# Patient Record
Sex: Female | Born: 1965
Health system: Southern US, Community
[De-identification: ages and names within clinical notes are randomized; demographics above are authoritative.]

## PROBLEM LIST (undated history)

## (undated) DIAGNOSIS — M5030 Other cervical disc degeneration, unspecified cervical region: Secondary | ICD-10-CM

## (undated) DIAGNOSIS — I10 Essential (primary) hypertension: Secondary | ICD-10-CM

## (undated) DIAGNOSIS — Z9889 Other specified postprocedural states: Secondary | ICD-10-CM

## (undated) DIAGNOSIS — T4145XA Adverse effect of unspecified anesthetic, initial encounter: Secondary | ICD-10-CM

## (undated) DIAGNOSIS — D485 Neoplasm of uncertain behavior of skin: Secondary | ICD-10-CM

## (undated) DIAGNOSIS — Z936 Other artificial openings of urinary tract status: Secondary | ICD-10-CM

## (undated) DIAGNOSIS — R112 Nausea with vomiting, unspecified: Secondary | ICD-10-CM

## (undated) DIAGNOSIS — C801 Malignant (primary) neoplasm, unspecified: Secondary | ICD-10-CM

## (undated) DIAGNOSIS — K2950 Unspecified chronic gastritis without bleeding: Secondary | ICD-10-CM

## (undated) DIAGNOSIS — F419 Anxiety disorder, unspecified: Secondary | ICD-10-CM

## (undated) DIAGNOSIS — Z87442 Personal history of urinary calculi: Secondary | ICD-10-CM

## (undated) DIAGNOSIS — T8859XA Other complications of anesthesia, initial encounter: Secondary | ICD-10-CM

## (undated) DIAGNOSIS — K219 Gastro-esophageal reflux disease without esophagitis: Secondary | ICD-10-CM

## (undated) DIAGNOSIS — M1990 Unspecified osteoarthritis, unspecified site: Secondary | ICD-10-CM

## (undated) DIAGNOSIS — N281 Cyst of kidney, acquired: Secondary | ICD-10-CM

## (undated) DIAGNOSIS — K317 Polyp of stomach and duodenum: Secondary | ICD-10-CM

## (undated) DIAGNOSIS — D649 Anemia, unspecified: Secondary | ICD-10-CM

## (undated) DIAGNOSIS — K52831 Collagenous colitis: Secondary | ICD-10-CM

## (undated) DIAGNOSIS — K529 Noninfective gastroenteritis and colitis, unspecified: Secondary | ICD-10-CM

## (undated) HISTORY — DX: Essential (primary) hypertension: I10

## (undated) HISTORY — DX: Collagenous colitis: K52.831

## (undated) HISTORY — PX: HIP ARTHROPLASTY: SHX981

## (undated) HISTORY — DX: Cyst of kidney, acquired: N28.1

## (undated) HISTORY — DX: Polyp of stomach and duodenum: K31.7

## (undated) HISTORY — PX: APPENDECTOMY: SHX54

## (undated) HISTORY — PX: BACK SURGERY: SHX140

## (undated) HISTORY — DX: Unspecified chronic gastritis without bleeding: K29.50

## (undated) HISTORY — DX: Neoplasm of uncertain behavior of skin: D48.5

## (undated) HISTORY — DX: Noninfective gastroenteritis and colitis, unspecified: K52.9

---
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
## 2003-03-23 HISTORY — PX: ABDOMINAL HYSTERECTOMY: SHX81

## 2012-11-07 ENCOUNTER — Emergency Department: Payer: Self-pay | Admitting: Internal Medicine

## 2012-11-07 LAB — URINALYSIS, COMPLETE
Bacteria: NONE SEEN
Bilirubin,UR: NEGATIVE
Blood: NEGATIVE
Glucose,UR: NEGATIVE mg/dL (ref 0–75)
Ketone: NEGATIVE
Nitrite: NEGATIVE
Ph: 7 (ref 4.5–8.0)
Protein: NEGATIVE
RBC,UR: 1 /HPF (ref 0–5)
Specific Gravity: 1.016 (ref 1.003–1.030)
Squamous Epithelial: 1
WBC UR: 2 /HPF (ref 0–5)

## 2013-02-06 DIAGNOSIS — M47812 Spondylosis without myelopathy or radiculopathy, cervical region: Secondary | ICD-10-CM | POA: Insufficient documentation

## 2013-02-06 DIAGNOSIS — M5000 Cervical disc disorder with myelopathy, unspecified cervical region: Secondary | ICD-10-CM | POA: Insufficient documentation

## 2013-03-22 HISTORY — PX: NECK SURGERY: SHX720

## 2013-05-22 DIAGNOSIS — M5116 Intervertebral disc disorders with radiculopathy, lumbar region: Secondary | ICD-10-CM | POA: Insufficient documentation

## 2013-06-19 ENCOUNTER — Encounter: Payer: Self-pay | Admitting: Neurosurgery

## 2013-06-20 ENCOUNTER — Encounter: Payer: Self-pay | Admitting: Neurosurgery

## 2013-07-20 ENCOUNTER — Encounter: Payer: Self-pay | Admitting: Neurosurgery

## 2013-08-20 ENCOUNTER — Encounter: Payer: Self-pay | Admitting: Neurosurgery

## 2013-09-19 ENCOUNTER — Encounter: Payer: Self-pay | Admitting: Neurosurgery

## 2013-10-10 ENCOUNTER — Ambulatory Visit: Payer: Self-pay | Admitting: Neurosurgery

## 2013-10-25 DIAGNOSIS — M1610 Unilateral primary osteoarthritis, unspecified hip: Secondary | ICD-10-CM | POA: Insufficient documentation

## 2013-10-25 HISTORY — DX: Unilateral primary osteoarthritis, unspecified hip: M16.10

## 2013-11-01 ENCOUNTER — Ambulatory Visit: Payer: Self-pay | Admitting: Gastroenterology

## 2013-11-01 HISTORY — PX: COLONOSCOPY W/ BIOPSIES: SHX1374

## 2013-11-01 LAB — HM COLONOSCOPY

## 2013-11-20 DIAGNOSIS — K2950 Unspecified chronic gastritis without bleeding: Secondary | ICD-10-CM

## 2013-11-20 HISTORY — DX: Unspecified chronic gastritis without bleeding: K29.50

## 2013-11-22 ENCOUNTER — Ambulatory Visit: Payer: Self-pay | Admitting: Gastroenterology

## 2013-11-22 HISTORY — PX: ESOPHAGOGASTRODUODENOSCOPY: SHX1529

## 2014-01-02 DIAGNOSIS — K900 Celiac disease: Secondary | ICD-10-CM | POA: Insufficient documentation

## 2014-01-02 DIAGNOSIS — K9049 Malabsorption due to intolerance, not elsewhere classified: Secondary | ICD-10-CM | POA: Insufficient documentation

## 2014-01-09 ENCOUNTER — Emergency Department: Payer: Self-pay | Admitting: Emergency Medicine

## 2014-01-09 LAB — BASIC METABOLIC PANEL
Anion Gap: 2 — ABNORMAL LOW (ref 7–16)
BUN: 14 mg/dL (ref 7–18)
Calcium, Total: 8.3 mg/dL — ABNORMAL LOW (ref 8.5–10.1)
Chloride: 109 mmol/L — ABNORMAL HIGH (ref 98–107)
Co2: 29 mmol/L (ref 21–32)
Creatinine: 0.78 mg/dL (ref 0.60–1.30)
EGFR (African American): >60
EGFR (Non-African Amer.): >60
Glucose: 104 mg/dL — ABNORMAL HIGH (ref 65–99)
Osmolality: 280 (ref 275–301)
Potassium: 3.5 mmol/L (ref 3.5–5.1)
Sodium: 140 mmol/L (ref 136–145)

## 2014-01-09 LAB — CBC
HCT: 40.1 % (ref 35.0–47.0)
HGB: 12.9 g/dL (ref 12.0–16.0)
MCH: 29.2 pg (ref 26.0–34.0)
MCHC: 32.1 g/dL (ref 32.0–36.0)
MCV: 91 fL (ref 80–100)
Platelet: 264 10*3/uL (ref 150–440)
RBC: 4.4 10*6/uL (ref 3.80–5.20)
RDW: 14.5 % (ref 11.5–14.5)
WBC: 12.6 10*3/uL — ABNORMAL HIGH (ref 3.6–11.0)

## 2014-01-09 LAB — TROPONIN I: Troponin-I: <0.02 ng/mL

## 2014-01-09 LAB — D-DIMER(ARMC): D-Dimer: 637 ng/ml

## 2014-06-28 DIAGNOSIS — Z96643 Presence of artificial hip joint, bilateral: Secondary | ICD-10-CM | POA: Insufficient documentation

## 2014-12-19 ENCOUNTER — Telehealth: Payer: Self-pay | Admitting: Family Medicine

## 2014-12-19 DIAGNOSIS — N281 Cyst of kidney, acquired: Secondary | ICD-10-CM | POA: Insufficient documentation

## 2014-12-19 DIAGNOSIS — K529 Noninfective gastroenteritis and colitis, unspecified: Secondary | ICD-10-CM | POA: Insufficient documentation

## 2014-12-19 DIAGNOSIS — K52831 Collagenous colitis: Secondary | ICD-10-CM | POA: Insufficient documentation

## 2014-12-19 DIAGNOSIS — I10 Essential (primary) hypertension: Secondary | ICD-10-CM | POA: Insufficient documentation

## 2014-12-19 NOTE — Telephone Encounter (Signed)
She had her BP check at pharmacy and it was 193/103. She went to ER. Had high bp, dizziness, headache. She doesn't remember what her BP was in the ER. She states she was out of it. They gave her Zofran and 6 Xanax and told her to call us. She said she was on Lisinopril in the past.

## 2014-12-19 NOTE — Telephone Encounter (Signed)
She needs an appointment for ER follow-up; make room please and make sure records are in [place]

## 2014-12-19 NOTE — Telephone Encounter (Signed)
I spoke with patient and advised her to please call the ER back and ask them to please treat her BP with actual BP meds until she can get in to see [place] on Monday. She agreed and will call us back once she talks with them.

## 2014-12-19 NOTE — Telephone Encounter (Signed)
Pt called and stated that she was taken to [facility] yesterday for a extremely high. They suggested she get on some type of bp medication but the first available appt is Monday the 3rd and she should do to keep it down until then

## 2014-12-19 NOTE — Telephone Encounter (Signed)
Per Dr. Sanda Klein since she has multiple work ins already tomorrow, have patient call back to the ER, demand that they treat her BP with actual BP medicine and not Xanax until she can get in here to be seen on Monday. Once patient has spoke with them, have her call us back to let us know what they said.

## 2014-12-23 ENCOUNTER — Ambulatory Visit (INDEPENDENT_AMBULATORY_CARE_PROVIDER_SITE_OTHER): Payer: No Typology Code available for payment source | Admitting: Family Medicine

## 2014-12-23 ENCOUNTER — Encounter: Payer: Self-pay | Admitting: Family Medicine

## 2014-12-23 VITALS — BP 148/84 | HR 98 | Temp 99.2°F | Ht 65.7 in | Wt 221.0 lb

## 2014-12-23 DIAGNOSIS — F430 Acute stress reaction: Secondary | ICD-10-CM | POA: Diagnosis not present

## 2014-12-23 DIAGNOSIS — R51 Headache: Secondary | ICD-10-CM

## 2014-12-23 DIAGNOSIS — R519 Headache, unspecified: Secondary | ICD-10-CM

## 2014-12-23 DIAGNOSIS — R930 Abnormal findings on diagnostic imaging of skull and head, not elsewhere classified: Secondary | ICD-10-CM

## 2014-12-23 DIAGNOSIS — H9201 Otalgia, right ear: Secondary | ICD-10-CM

## 2014-12-23 DIAGNOSIS — Z720 Tobacco use: Secondary | ICD-10-CM | POA: Insufficient documentation

## 2014-12-23 DIAGNOSIS — I10 Essential (primary) hypertension: Secondary | ICD-10-CM

## 2014-12-23 DIAGNOSIS — R9089 Other abnormal findings on diagnostic imaging of central nervous system: Secondary | ICD-10-CM

## 2014-12-23 HISTORY — DX: Tobacco use: Z72.0

## 2014-12-23 MED ORDER — METOPROLOL SUCCINATE ER 25 MG PO TB24: 25.0000 mg | ORAL_TABLET | Freq: Every day | ORAL | Status: DC

## 2014-12-23 MED ORDER — BUPROPION HCL ER (XL) 150 MG PO TB24: 150.0000 mg | ORAL_TABLET | Freq: Every day | ORAL | Status: DC

## 2014-12-23 NOTE — Progress Notes (Signed)
BP 148/84 mmHg  Pulse 98  Temp(Src) 99.2 F (37.3 C)  Ht 5' 5.7" (1.669 m)  Wt 221 lb (100.245 kg)  BMI 35.99 kg/m2  SpO2 100%   Subjective:    [name], female    DOB: [birth year], 49 y.o.   MRN: [id number]  HPI: Susan Snyder is a 49 y.o. female  Chief Complaint  Patient presents with  . Hypertension    patient went to ER for HTN, she states that she has been really fatigued also.  . 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; financial stress from surgeries and being out of work; feels overwhelmed sometimes; I just feel like I could crawl out of my skin; if she sits still, she gets depressed; struggling with death of husband from four years ago; did work with counselor before  I reviewed the ER notes from [facility] from her Sept 28, 2016 encounter; BP was 185/81, pulse 87, O2 sat 97%; description of problems in encounter included "elevated BP, shortness of breath, vision disturbance, abnormal head CT, acute nonintractable headache, unspecified headache, elevated blood pressure, anxiety state"; prescriptions written were ondansetron (ten pills) and alprazolam (six pills) Her trop I was <0.01, CK 66, CMP unremarkable, WBC 11.3k with unremarkable diff  Head CT 12/18/14 report (copied and pasted): IMPRESSION:  1. Moderately pronounced white matter disease process involving each  cerebral hemisphere. Please see above discussion.  2. Otherwise, without evidence of intracranial hemorrhage or mass-effect.  MRI of brain 12/18/14 report (copied and pasted): Impression:  1. Multiple foci abnormal white matter signal as described.  None of these  are in the corpus callosum. There Yang be small lesions in the pons. See  above discussion and differential.  2. Negative for acute infarction or hemorrhage. Negative for mass or  abnormal enhancement.  -----------------------------------  For the abnormal CT of the head, and they referred her to a neurologist; she is going to see someone in Dr. Rico Sheehan office  Depression screen [facility] 2/9 12/23/2014  Decreased Interest 0  Down, Depressed, Hopeless 1  PHQ - 2 Score 1   She does not eat a lot of salt, but does eat a lot of salty foods; no decongestants; no black licorice; smoker; smokes 1/2 ppd; she's been trying to quit smoking; she called back to the ER but they told her if it went high to go back to the ER  She has felt like she has water in her right ear, every morning for 1-2 months; sometimes it is sore; tried swimmer's ear and ear wax removal kit  Relevant past medical, surgical, family and social history reviewed and updated as indicated. Interim medical history since our last visit reviewed. Allergies and medications reviewed and updated.  Review of Systems  Constitutional: Positive for unexpected weight change (gain).  Cardiovascular: Positive for palpitations.  Neurological: Positive for headaches (terrible).  Psychiatric/Behavioral: Positive for dysphoric mood. The patient is nervous/anxious (last few weeks).    Per HPI unless specifically indicated above     Objective:  BP 148/84 mmHg  Pulse 98  Temp(Src) 99.2 F (37.3 C)  Ht 5' 5.7" (1.669 m)  Wt 221 lb (100.245 kg)  BMI 35.99 kg/m2  SpO2 100%  Wt Readings from Last 3 Encounters:  12/23/14 221 lb (100.245 kg)  01/07/14 216 lb (97.977 kg)    Physical Exam  Constitutional: She appears well-developed and well-nourished.  HENT:  Right Ear: Hearing, tympanic membrane, external ear and ear canal normal.  Left Ear: Hearing, tympanic membrane, external ear and ear canal normal.  Nose: No  rhinorrhea.  Mouth/Throat: Oropharynx is clear and moist and mucous membranes are normal.  Eyes: Right eye exhibits no discharge. Left eye exhibits no discharge. Right conjunctiva is not injected. Left conjunctiva is not injected.  Cardiovascular: Normal rate and regular rhythm.   Pulmonary/Chest: Effort normal and breath sounds normal.  Musculoskeletal: She exhibits no edema.  Lymphadenopathy:    She has no cervical adenopathy.  Skin: No pallor.  No flushing  Psychiatric: She has a normal mood and affect. Her behavior is normal.    Results for orders placed or performed in visit on 12/19/14  HM COLONOSCOPY  Result Value Ref Range   HM Colonoscopy collagenous colitis       Assessment & Plan:   Problem List Items Addressed This Visit      Cardiovascular and Mediastinum   Hypertension - Primary    DASH guidelines; start beta-blocker since heart rate running 95-105; stress reduction, close f/u      Relevant Medications   metoprolol succinate (TOPROL-XL) 25 MG 24 hr tablet     Other   Tobacco abuse    Encouraged cessation; tips for success discussed      Stress reaction    Supportive listening provided; suggested counseling; I am not inclined to prescribe a benzo for her; start bupropion which Goldenstein also help with smoking cessation; stress reduction discussed      Frequent headaches    Abnormal CT scan of head in the ER, abnormal brain MRI same day; reports reviewed after patient's appointment; she has been referred to specialist for evaluation      Relevant Medications   traMADol (ULTRAM) 50 MG tablet   buPROPion (WELLBUTRIN XL) 150 MG 24 hr tablet   metoprolol succinate (TOPROL-XL) 25 MG 24 hr tablet   Abnormal finding on MRI of brain    Report reviewed by me; she has been referred to specialist for evaluation       Other Visit Diagnoses    Otalgia, right        referral to ENT for persistent pain in the right ear    Relevant Orders    Ambulatory referral to ENT        Follow up plan: Return in about 3 weeks (around 01/13/2015) for reassessment.  An after-visit summary was printed and given to the patient at [facility].  Please see the patient instructions which Epstein contain other information and recommendations beyond what is mentioned above in the assessment and plan.  Meds ordered this encounter  Medications  . DISCONTD: ondansetron (ZOFRAN-ODT) 4 MG disintegrating tablet    Sig: Take at onset of headache with a dose of tylenol and a caffeine containing beverage. No more than one dose every 8 hours.  . traMADol (ULTRAM) 50 MG tablet    Sig: Take by mouth.  . DISCONTD: ALPRAZolam (XANAX) 0.5 MG tablet    Sig: TAKE 1 TABLET BY MOUTH 2 TMIES DAILY AS NEEDED FOR ANXIETY FOR UP TO 3  DAYS    Refill:  0  . buPROPion (WELLBUTRIN XL) 150 MG 24 hr tablet    Sig: Take 1 tablet (150 mg total) by mouth daily.    Dispense:  30 tablet    Refill:  0  . metoprolol succinate (TOPROL-XL) 25 MG 24 hr tablet    Sig: Take 1 tablet (25 mg total) by mouth daily.    Dispense:  30 tablet    Refill:  0   Orders Placed This Encounter  Procedures  . Ambulatory referral to ENT

## 2014-12-23 NOTE — Assessment & Plan Note (Addendum)
DASH guidelines; start beta-blocker since heart rate running 95-105; stress reduction, close f/u

## 2014-12-23 NOTE — Patient Instructions (Addendum)
Your goal blood pressure is less than 140 mmHg on top. Try to follow the DASH guidelines (DASH stands for Dietary Approaches to Stop Hypertension) Try to limit the sodium in your diet.  Ideally, consume less than 1.5 grams (less than 1,580m) per day. Do not add salt when cooking or at the table.  Check the sodium amount on labels when shopping, and choose items lower in sodium when given a choice. Avoid or limit foods that already contain a lot of sodium. Eat a diet rich in fruits and vegetables and whole grains. Start the new blood pressure medicine called metoprolol, once a day (either morning or night, same time of day) Start the new stress medicine called bupropion, once a day (in the morning) Do try to quit smoking Return in 3 weeks for recheck We'll refer you to the ENT about your ear pain  DASH Eating Plan DASH stands for "Dietary Approaches to Stop Hypertension." The DASH eating plan is a healthy eating plan that has been shown to reduce high blood pressure (hypertension). Additional health benefits Feldmeier include reducing the risk of type 2 diabetes mellitus, heart disease, and stroke. The DASH eating plan Henken also help with weight loss. WHAT DO I NEED TO KNOW ABOUT THE DASH EATING PLAN? For the DASH eating plan, you will follow these general guidelines:  Choose foods with a percent daily value for sodium of less than 5% (as listed on the food label).  Use salt-free seasonings or herbs instead of table salt or sea salt.  Check with your health care provider or pharmacist before using salt substitutes.  Eat lower-sodium products, often labeled as "lower sodium" or "no salt added."  Eat fresh foods.  Eat more vegetables, fruits, and low-fat dairy products.  Choose whole grains. Look for the word "whole" as the first word in the ingredient list.  Choose fish and skinless chicken or t[place]more often than red meat. Limit fish, poultry, and meat to 6 oz (170 g) each day.  Limit  sweets, desserts, sugars, and sugary drinks.  Choose heart-healthy fats.  Limit cheese to 1 oz (28 g) per day.  Eat more home-cooked food and less restaurant, buffet, and fast food.  Limit fried foods.  Cook foods using methods other than frying.  Limit canned vegetables. If you do use them, rinse them well to decrease the sodium.  When eating at a restaurant, ask that your food be prepared with less salt, or no salt if possible. WHAT FOODS CAN I EAT? Seek help from a dietitian for individual calorie needs. Grains Whole grain or whole wheat bread. Brown rice. Whole grain or whole wheat pasta. Quinoa, bulgur, and whole grain cereals. Low-sodium cereals. Corn or whole wheat flour tortillas. Whole grain cornbread. Whole grain crackers. Low-sodium crackers. Vegetables Fresh or frozen vegetables (raw, steamed, roasted, or grilled). Low-sodium or reduced-sodium tomato and vegetable juices. Low-sodium or reduced-sodium tomato sauce and paste. Low-sodium or reduced-sodium canned vegetables.  Fruits All fresh, canned (in natural juice), or frozen fruits. Meat and Other Protein Products Ground beef (85% or leaner), grass-fed beef, or beef trimmed of fat. Skinless chicken or t[place] Ground chicken or t[place] Pork trimmed of fat. All fish and seafood. Eggs. Dried beans, peas, or lentils. Unsalted nuts and seeds. Unsalted canned beans. Dairy Low-fat dairy products, such as skim or 1% milk, 2% or reduced-fat cheeses, low-fat ricotta or cottage cheese, or plain low-fat yogurt. Low-sodium or reduced-sodium cheeses. Fats and Oils Tub margarines without trans fats. Light or  reduced-fat mayonnaise and salad dressings (reduced sodium). Avocado. Safflower, olive, or canola oils. Natural peanut or almond butter. Other Unsalted popcorn and pretzels. The items listed above Selk not be a complete list of recommended foods or beverages. Contact your dietitian for more options. WHAT FOODS ARE NOT  RECOMMENDED? Grains White bread. White pasta. White rice. Refined cornbread. Bagels and croissants. Crackers that contain trans fat. Vegetables Creamed or fried vegetables. Vegetables in a cheese sauce. Regular canned vegetables. Regular canned tomato sauce and paste. Regular tomato and vegetable juices. Fruits Dried fruits. Canned fruit in light or heavy syrup. Fruit juice. Meat and Other Protein Products Fatty cuts of meat. Ribs, chicken wings, bacon, sausage, bologna, salami, chitterlings, fatback, hot dogs, bratwurst, and packaged luncheon meats. Salted nuts and seeds. Canned beans with salt. Dairy Whole or 2% milk, cream, half-and-half, and cream cheese. Whole-fat or sweetened yogurt. Full-fat cheeses or blue cheese. Nondairy creamers and whipped toppings. Processed cheese, cheese spreads, or cheese curds. Condiments Onion and garlic salt, seasoned salt, table salt, and sea salt. Canned and packaged gravies. Worcestershire sauce. Tartar sauce. Barbecue sauce. Teriyaki sauce. Soy sauce, including reduced sodium. Steak sauce. Fish sauce. Oyster sauce. Cocktail sauce. Horseradish. Ketchup and mustard. Meat flavorings and tenderizers. Bouillon cubes. Hot sauce. Tabasco sauce. Marinades. Taco seasonings. Relishes. Fats and Oils Butter, stick margarine, lard, shortening, ghee, and bacon fat. Coconut, palm kernel, or palm oils. Regular salad dressings. Other Pickles and olives. Salted popcorn and pretzels. The items listed above Lemming not be a complete list of foods and beverages to avoid. Contact your dietitian for more information. WHERE CAN I FIND MORE INFORMATION? National Heart, Lung, and Blood Institute: travelstabloid.com Document Released: 02/25/2011 Document Revised: 07/23/2013 Document Reviewed: 01/10/2013 [facility] Patient Information 2015 [place], [place]. This information is not intended to replace advice given to you by your health care provider. Make  sure you discuss any questions you have with your health care provider. Smoking Cessation, Tips for Success If you are ready to quit smoking, congratulations! You have chosen to help yourself be healthier. Cigarettes bring nicotine, tar, carbon monoxide, and other irritants into your body. Your lungs, heart, and blood vessels will be able to work better without these poisons. There are many different ways to quit smoking. Nicotine gum, nicotine patches, a nicotine inhaler, or nicotine nasal spray can help with physical craving. Hypnosis, support groups, and medicines help break the habit of smoking. WHAT THINGS CAN I DO TO MAKE QUITTING EASIER?  Here are some tips to help you quit for good:  Pick a date when you will quit smoking completely. Tell all of your friends and family about your plan to quit on that date.  Do not try to slowly cut down on the number of cigarettes you are smoking. Pick a quit date and quit smoking completely starting on that day.  Throw away all cigarettes.   Clean and remove all ashtrays from your home, work, and car.  On a card, write down your reasons for quitting. Carry the card with you and read it when you get the urge to smoke.  Cleanse your body of nicotine. Drink enough water and fluids to keep your urine clear or pale yellow. Do this after quitting to flush the nicotine from your body.  Learn to predict your moods. Do not let a bad situation be your excuse to have a cigarette. Some situations in your life might tempt you into wanting a cigarette.  Never have "just one" cigarette. It leads to  wanting another and another. Remind yourself of your decision to quit.  Change habits associated with smoking. If you smoked while driving or when feeling stressed, try other activities to replace smoking. Stand up when drinking your coffee. Brush your teeth after eating. Sit in a different chair when you read the paper. Avoid alcohol while trying to quit, and try to  drink fewer caffeinated beverages. Alcohol and caffeine Shifflett urge you to smoke.  Avoid foods and drinks that can trigger a desire to smoke, such as sugary or spicy foods and alcohol.  Ask people who smoke not to smoke around you.  Have something planned to do right after eating or having a cup of coffee. For example, plan to take a walk or exercise.  Try a relaxation exercise to calm you down and decrease your stress. Remember, you Kuster be tense and nervous for the first 2 weeks after you quit, but this will pass.  Find new activities to keep your hands busy. Play with a pen, coin, or rubber band. Doodle or draw things on paper.  Brush your teeth right after eating. This will help cut down on the craving for the taste of tobacco after meals. You can also try mouthwash.   Use oral substitutes in place of cigarettes. Try using lemon drops, carrots, cinnamon sticks, or chewing gum. Keep them handy so they are available when you have the urge to smoke.  When you have the urge to smoke, try deep breathing.  Designate your home as a nonsmoking area.  If you are a heavy smoker, ask your health care provider about a prescription for nicotine chewing gum. It can ease your withdrawal from nicotine.  Reward yourself. Set aside the cigarette money you save and buy yourself something nice.  Look for support from others. Join a support group or smoking cessation program. Ask someone at home or at work to help you with your plan to quit smoking.  Always ask yourself, "Do I need this cigarette or is this just a reflex?" Tell yourself, "Today, I choose not to smoke," or "I do not want to smoke." You are reminding yourself of your decision to quit.  Do not replace cigarette smoking with electronic cigarettes (commonly called e-cigarettes). The safety of e-cigarettes is unknown, and some [place] contain harmful chemicals.  If you relapse, do not give up! Plan ahead and think about what you will do the next  time you get the urge to smoke. HOW WILL I FEEL WHEN I QUIT SMOKING? You Bagnall have symptoms of withdrawal because your body is used to nicotine (the addictive substance in cigarettes). You Gessel crave cigarettes, be irritable, feel very hungry, cough often, get headaches, or have difficulty concentrating. The withdrawal symptoms are only temporary. They are strongest when you first quit but will go away within 10-14 days. When withdrawal symptoms occur, stay in control. Think about your reasons for quitting. Remind yourself that these are signs that your body is healing and getting used to being without cigarettes. Remember that withdrawal symptoms are easier to treat than the major diseases that smoking can cause.  Even after the withdrawal is over, expect periodic urges to smoke. However, these cravings are generally short lived and will go away whether you smoke or not. Do not smoke! WHAT RESOURCES ARE AVAILABLE TO HELP ME QUIT SMOKING? Your health care provider can direct you to community resources or hospitals for support, which [place] include:  Group support.  Education.  Hypnosis.  Therapy.  Document Released: 12/05/2003 Document Revised: 07/23/2013 Document Reviewed: 08/24/2012 [facility] Patient Information 2015 [place], [place]. This information is not intended to replace advice given to you by your health care provider. Make sure you discuss any questions you have with your health care provider.

## 2014-12-28 DIAGNOSIS — R51 Headache: Secondary | ICD-10-CM

## 2014-12-28 DIAGNOSIS — R519 Headache, unspecified: Secondary | ICD-10-CM | POA: Insufficient documentation

## 2014-12-28 DIAGNOSIS — R9089 Other abnormal findings on diagnostic imaging of central nervous system: Secondary | ICD-10-CM | POA: Insufficient documentation

## 2014-12-28 HISTORY — DX: Headache, unspecified: R51.9

## 2014-12-28 HISTORY — DX: Other abnormal findings on diagnostic imaging of central nervous system: R90.89

## 2014-12-28 NOTE — Assessment & Plan Note (Addendum)
Abnormal CT scan of head in the ER, abnormal brain MRI same day; reports reviewed after patient's appointment; she has been referred to specialist for evaluation

## 2014-12-28 NOTE — Assessment & Plan Note (Addendum)
Supportive listening provided; suggested counseling; I am not inclined to prescribe a benzo for her; start bupropion which Horrell also help with smoking cessation; stress reduction discussed

## 2014-12-28 NOTE — Assessment & Plan Note (Addendum)
Report reviewed by me; she has been referred to specialist for evaluation

## 2014-12-28 NOTE — Assessment & Plan Note (Signed)
Encouraged cessation; tips for success discussed

## 2015-01-14 ENCOUNTER — Ambulatory Visit (INDEPENDENT_AMBULATORY_CARE_PROVIDER_SITE_OTHER): Payer: No Typology Code available for payment source | Admitting: Family Medicine

## 2015-01-14 ENCOUNTER — Encounter: Payer: Self-pay | Admitting: Family Medicine

## 2015-01-14 VITALS — BP 139/84 | HR 77 | Temp 99.0°F | Wt 219.0 lb

## 2015-01-14 DIAGNOSIS — Z0000 Encounter for general adult medical examination without abnormal findings: Secondary | ICD-10-CM

## 2015-01-14 DIAGNOSIS — R930 Abnormal findings on diagnostic imaging of skull and head, not elsewhere classified: Secondary | ICD-10-CM

## 2015-01-14 DIAGNOSIS — Z720 Tobacco use: Secondary | ICD-10-CM | POA: Diagnosis not present

## 2015-01-14 DIAGNOSIS — K52831 Collagenous colitis: Secondary | ICD-10-CM

## 2015-01-14 DIAGNOSIS — E559 Vitamin D deficiency, unspecified: Secondary | ICD-10-CM | POA: Diagnosis not present

## 2015-01-14 DIAGNOSIS — Z862 Personal history of diseases of the blood and blood-forming organs and certain disorders involving the immune mechanism: Secondary | ICD-10-CM | POA: Diagnosis not present

## 2015-01-14 DIAGNOSIS — I10 Essential (primary) hypertension: Secondary | ICD-10-CM

## 2015-01-14 DIAGNOSIS — E661 Drug-induced obesity: Secondary | ICD-10-CM | POA: Insufficient documentation

## 2015-01-14 DIAGNOSIS — E669 Obesity, unspecified: Secondary | ICD-10-CM

## 2015-01-14 DIAGNOSIS — R9089 Other abnormal findings on diagnostic imaging of central nervous system: Secondary | ICD-10-CM

## 2015-01-14 DIAGNOSIS — E538 Deficiency of other specified B group vitamins: Secondary | ICD-10-CM | POA: Diagnosis not present

## 2015-01-14 HISTORY — DX: Personal history of diseases of the blood and blood-forming organs and certain disorders involving the immune mechanism: Z86.2

## 2015-01-14 NOTE — Assessment & Plan Note (Signed)
Encouraged complete cessation; continue bupropion

## 2015-01-14 NOTE — Assessment & Plan Note (Signed)
She has lost two pounds since last visit; continue healthy changes like cutting out soft drinks, limiting red meat, sweets

## 2015-01-14 NOTE — Assessment & Plan Note (Signed)
Well-controlled on budesonide; managed by GI

## 2015-01-14 NOTE — Patient Instructions (Addendum)
We'll contact you about the lab results Your goal blood pressure is less than 140 mmHg on top and less than 90 mmHg on the bottom Try to follow the DASH guidelines (DASH stands for Dietary Approaches to Stop Hypertension) Try to limit the sodium in your diet.  Ideally, consume less than 1.5 grams (less than 1,585m) per day. Do not add salt when cooking or at the table.  Check the sodium amount on labels when shopping, and choose items lower in sodium when given a choice. Avoid or limit foods that already contain a lot of sodium. Eat a diet rich in fruits and vegetables and whole grains. Do consider calling 1-800-QUIT-NOW and/or [phone] [phone] to register for a 4 week QuitSmart cessation class at the hospital  Check out the information at familydoctor.org entitled "What It Takes to Lose Weight" Try to lose between 1-2 pounds per week by taking in fewer calories and burning off more calories You can succeed by limiting portions, limiting foods dense in calories and fat, becoming more active, and drinking 8 glasses of water a day (64 ounces) Don't skip meals, especially breakfast, as skipping meals Ewy alter your metabolism Do not use over-the-counter weight loss pills or gimmicks that claim rapid weight loss A healthy BMI (or body mass index) is between 18.5 and 24.9 You can calculate your ideal BMI at the NH[place]website hClubMonetize.fr Check out One GSm[employer] Containerand other websites for mAnheuser-Buschideas and recipes Try meatless crumbles, Boca burgers, Sweet Earth seitan, HJPMorgan Chase & Covegan sausage mix (all plant protein), Sweet Earth [place] breakfast burritos, etc. You can further limit intake of saturated fats by switching to dairy free products (Silk soy vanilla creamer, or Silk caramel almond creamer; coconut milk coffee creamer, almond milk, Tofutti brand cream cheese and sour cream, Follow Your Heart vegenaise (mayonnaise alternative) and  Follow Your Heart cheese alternative (the gouda is my favorite)   DASH Eating Plan DASH stands for "Dietary Approaches to Stop Hypertension." The DASH eating plan is a healthy eating plan that has been shown to reduce high blood pressure (hypertension). Additional health benefits Pharo include reducing the risk of type 2 diabetes mellitus, heart disease, and stroke. The DASH eating plan Batiz also help with weight loss. WHAT DO I NEED TO KNOW ABOUT THE DASH EATING PLAN? For the DASH eating plan, you will follow these general guidelines:  Choose foods with a percent daily value for sodium of less than 5% (as listed on the food label).  Use salt-free seasonings or herbs instead of table salt or sea salt.  Check with your health care provider or pharmacist before using salt substitutes.  Eat lower-sodium products, often labeled as "lower sodium" or "no salt added."  Eat fresh foods.  Eat more vegetables, fruits, and low-fat dairy products.  Choose whole grains. Look for the word "whole" as the first word in the ingredient list.  Choose fish and skinless chicken or tKuwaitmore often than red meat. Limit fish, poultry, and meat to 6 oz (170 g) each day.  Limit sweets, desserts, sugars, and sugary drinks.  Choose heart-healthy fats.  Limit cheese to 1 oz (28 g) per day.  Eat more home-cooked food and less restaurant, buffet, and fast food.  Limit fried foods.  Cook foods using methods other than frying.  Limit canned vegetables. If you do use them, rinse them well to decrease the sodium.  When eating at a restaurant, ask that your food be prepared with less  salt, or no salt if possible. WHAT FOODS CAN I EAT? Seek help from a dietitian for individual calorie needs. Grains Whole grain or whole wheat bread. Brown rice. Whole grain or whole wheat pasta. Quinoa, bulgur, and whole grain cereals. Low-sodium cereals. Corn or whole wheat flour tortillas. Whole grain cornbread. Whole grain  crackers. Low-sodium crackers. Vegetables Fresh or frozen vegetables (raw, steamed, roasted, or grilled). Low-sodium or reduced-sodium tomato and vegetable juices. Low-sodium or reduced-sodium tomato sauce and paste. Low-sodium or reduced-sodium canned vegetables.  Fruits All fresh, canned (in natural juice), or frozen fruits. Meat and Other Protein Products Ground beef (85% or leaner), grass-fed beef, or beef trimmed of fat. Skinless chicken or [place]. Ground chicken or [place]. Pork trimmed of fat. All fish and seafood. Eggs. Dried beans, peas, or lentils. Unsalted nuts and seeds. Unsalted canned beans. Dairy Low-fat dairy products, such as skim or 1% milk, 2% or reduced-fat cheeses, low-fat ricotta or cottage cheese, or plain low-fat yogurt. Low-sodium or reduced-sodium cheeses. Fats and Oils Tub margarines without trans fats. Light or reduced-fat mayonnaise and salad dressings (reduced sodium). Avocado. Safflower, olive, or canola oils. Natural peanut or almond butter. Other Unsalted popcorn and pretzels. The items listed above [place] not be a complete list of recommended foods or beverages. Contact your dietitian for more options. WHAT FOODS ARE NOT RECOMMENDED? Grains White bread. White pasta. White rice. Refined cornbread. Bagels and croissants. Crackers that contain trans fat. Vegetables Creamed or fried vegetables. Vegetables in a cheese sauce. Regular canned vegetables. Regular canned tomato sauce and paste. Regular tomato and vegetable juices. Fruits Dried fruits. Canned fruit in light or heavy syrup. Fruit juice. Meat and Other Protein Products Fatty cuts of meat. Ribs, chicken wings, bacon, sausage, bologna, salami, chitterlings, fatback, hot dogs, bratwurst, and packaged luncheon meats. Salted nuts and seeds. Canned beans with salt. Dairy Whole or 2% milk, cream, half-and-half, and cream cheese. Whole-fat or sweetened yogurt. Full-fat cheeses or blue cheese. Nondairy creamers and  whipped toppings. Processed cheese, cheese spreads, or cheese curds. Condiments Onion and garlic salt, seasoned salt, table salt, and sea salt. Canned and packaged gravies. Worcestershire sauce. Tartar sauce. Barbecue sauce. Teriyaki sauce. Soy sauce, including reduced sodium. Steak sauce. Fish sauce. Oyster sauce. Cocktail sauce. Horseradish. Ketchup and mustard. Meat flavorings and tenderizers. Bouillon cubes. Hot sauce. Tabasco sauce. Marinades. Taco seasonings. Relishes. Fats and Oils Butter, stick margarine, lard, shortening, ghee, and bacon fat. Coconut, palm kernel, or palm oils. Regular salad dressings. Other Pickles and olives. Salted popcorn and pretzels. The items listed above [place] not be a complete list of foods and beverages to avoid. Contact your dietitian for more information. WHERE CAN I FIND MORE INFORMATION? National Heart, Lung, and Blood Institute: travelstabloid.com   This information is not intended to replace advice given to you by your health care provider. Make sure you discuss any questions you have with your health care provider.   Document Released: 02/25/2011 Document Revised: 03/29/2014 Document Reviewed: 01/10/2013 Elsevier Interactive Patient Education [employer].

## 2015-01-14 NOTE — Assessment & Plan Note (Signed)
Check level and supplement as indicated; vit D deficiency can cause fatigue

## 2015-01-14 NOTE — Assessment & Plan Note (Signed)
Glad to hear that it is not likely MS; she will follow-up with neurologist; if white matter disease related to hypertension, smoking, etc, then we'll focus on controlling those

## 2015-01-14 NOTE — Assessment & Plan Note (Signed)
Check level and supplement as indicated; vitamin B12 deficiency can cause fatigue

## 2015-01-14 NOTE — Progress Notes (Signed)
BP 139/84 mmHg  Pulse 77  Temp(Src) 99 F (37.2 C)  Wt 219 lb (99.338 kg)  SpO2 98%   Subjective:    [name], female    DOB: 12-May-1965, 49 y.o.   MRN: [id number]  HPI: Susan Snyder is a 49 y.o. female  Chief Complaint  Patient presents with  . Hypertension    She was seen on October 3rd with high blood pressure; note reviewed; she was started on a low-dose beta-blocker since she had tachycardia and anxiety; however, she reports fatigue with the new medicine; no melt-downs at work on the new medicines; she hates to change when it is helping; she is really no more fatigued that before; just makes her sleepy; fatigued even before this beta-blocker  She saw the neurologist; diagnosed with white matter disease; he did not think it was MS; he wants her primary (me) to keep an eye on the blood sugar and keep the blood pressure under control; she is on the beta-blocker, but she feels tired; taking it at night now  She is obese; she had gained five pounds over the last year, but has managed to lose two pounds since her last visit here 3 weeks ago; has cut down on red meat, sweets, soft drinks  She is on the bupropion, started at the last visit; last PHQ-2 score earlier this month was only a 1; she had some anxiety as well  She has the colitis and takes the budesonide; her chronic diarrhea has resolved  She has a history of iron deficiency anemia; she took iron prior to her surgery, but afterwards, she quit taking it  She has a history of low vitamin D and B12; labs from [facility] pulled up and reviewed with patient; vit D level was 12.3 and vit B12 was 241 (both from August 2015)  She continues to smoke; she had some anxiety at the last visit and bupropion was started to help with that as well; she thinks she bupropion has helped with the cravings, and when she lights one, she doesn't want to finish the cigarettes  Relevant past medical, surgical, family and social  history reviewed and updated as indicated. Interim medical history since our last visit reviewed. Allergies and medications reviewed and updated.  Review of Systems  Constitutional: Positive for fatigue.  Musculoskeletal: Positive for back pain.       No B/B dysfunction  Psychiatric/Behavioral: The patient is nervous/anxious (improved on the medicine).   Per HPI unless specifically indicated above     Objective:    BP 139/84 mmHg  Pulse 77  Temp(Src) 99 F (37.2 C)  Wt 219 lb (99.338 kg)  SpO2 98%  Wt Readings from Last 3 Encounters:  01/14/15 219 lb (99.338 kg)  12/23/14 221 lb (100.245 kg)  01/07/14 216 lb (97.977 kg)  body mass index is 35.66 kg/(m^2).  Physical Exam  Constitutional: She appears well-developed and well-nourished. No distress.  obese  HENT:  Head: Normocephalic and atraumatic.  Eyes: EOM are normal. No scleral icterus.  Neck: No thyromegaly present.  Cardiovascular: Normal rate, regular rhythm and normal heart sounds.   No murmur heard. Pulmonary/Chest: Effort normal and breath sounds normal. No respiratory distress. She has no wheezes.  Abdominal: Soft. She exhibits no distension.  Musculoskeletal: Normal range of motion. She exhibits no edema.  Neurological: She is alert. She exhibits normal muscle tone.  Skin: Skin is warm and dry. She is not diaphoretic. No pallor.  Psychiatric: She has a normal mood and affect. Her behavior is normal. Judgment and thought content normal.   Results for orders placed or performed in visit on 12/19/14  HM COLONOSCOPY  Result Value Ref Range   HM Colonoscopy collagenous colitis       Assessment & Plan:   Problem List Items Addressed This Visit      Cardiovascular and Mediastinum   Hypertension - Primary    Blood pressure under better control, as well as normalization of heart rate, with the beta-blocker; however, patient reports fatigue with this; will have her try to follow the DASH guidelines, try to quit  smoking, try to lose weight; continue this for now, but can consider other medicines        Digestive   Collagenous colitis    Well-controlled on budesonide; managed by GI      Vitamin B12 deficiency    Check level and supplement as indicated; vitamin B12 deficiency can cause fatigue      Relevant Orders   Vitamin B12     Other   Tobacco abuse    Encouraged complete cessation; continue bupropion      Abnormal finding on MRI of brain    Glad to hear that it is not likely MS; she will follow-up with neurologist; if white matter disease related to hypertension, smoking, etc, then we'll focus on controlling those      Vitamin D deficiency    Check level and supplement as indicated; vit D deficiency can cause fatigue      Relevant Orders   Vit D  25 hydroxy (rtn osteoporosis monitoring)   Obesity    She has lost two pounds since last visit; continue healthy changes like cutting out soft drinks, limiting red meat, sweets      Relevant Orders   TSH   Hx of iron deficiency anemia   Relevant Orders   CBC with Differential/Platelet   Ferritin    Other Visit Diagnoses    Preventative health care        complete physical not done, but will check those components of fasting labs while she is getting other labs done; return soon for CPE    Relevant Orders    Comprehensive metabolic panel    Lipid Panel w/o Chol/HDL Ratio       Follow up plan: Return 3-4 weeks, for complete physical.  Orders Placed This Encounter  Procedures  . Vitamin B12  . Vit D  25 hydroxy (rtn osteoporosis monitoring)  . TSH  . Comprehensive metabolic panel  . CBC with Differential/Platelet  . Lipid Panel w/o Chol/HDL Ratio  . Ferritin   An after-visit summary was printed and given to the patient at [place].  Please see the patient instructions which [place] contain other information and recommendations beyond what is mentioned above in the assessment and plan.

## 2015-01-14 NOTE — Assessment & Plan Note (Addendum)
Blood pressure under better control, as well as normalization of heart rate, with the beta-blocker; however, patient reports fatigue with this; will have her try to follow the DASH guidelines, try to quit smoking, try to lose weight; continue this for now, but can consider other medicines

## 2015-01-15 LAB — COMPREHENSIVE METABOLIC PANEL
ALT: 11 IU/L (ref 0–32)
AST: 15 IU/L (ref 0–40)
Albumin/Globulin Ratio: 1.6 (ref 1.1–2.5)
Albumin: 4 g/dL (ref 3.5–5.5)
Alkaline Phosphatase: 92 IU/L (ref 39–117)
BUN/Creatinine Ratio: 17 (ref 9–23)
BUN: 13 mg/dL (ref 6–24)
Bilirubin Total: 0.3 mg/dL (ref 0.0–1.2)
CO2: 25 mmol/L (ref 18–29)
Calcium: 9.2 mg/dL (ref 8.7–10.2)
Chloride: 100 mmol/L (ref 97–106)
Creatinine, Ser: 0.78 mg/dL (ref 0.57–1.00)
GFR calc Af Amer: 103 mL/min/{1.73_m2} (ref >59)
GFR calc non Af Amer: 89 mL/min/{1.73_m2} (ref >59)
Globulin, Total: 2.5 g/dL (ref 1.5–4.5)
Glucose: 79 mg/dL (ref 65–99)
Potassium: 5 mmol/L (ref 3.5–5.2)
Sodium: 141 mmol/L (ref 136–144)
Total Protein: 6.5 g/dL (ref 6.0–8.5)

## 2015-01-15 LAB — FERRITIN: Ferritin: 72 ng/mL (ref 15–150)

## 2015-01-15 LAB — CBC WITH DIFFERENTIAL/PLATELET
Basophils Absolute: 0 10*3/uL (ref 0.0–0.2)
Basos: 0 %
EOS (ABSOLUTE): 0.4 10*3/uL (ref 0.0–0.4)
Eos: 4 %
Hematocrit: 40.4 % (ref 34.0–46.6)
Hemoglobin: 13.1 g/dL (ref 11.1–15.9)
Immature Grans (Abs): 0 10*3/uL (ref 0.0–0.1)
Immature Granulocytes: 0 %
Lymphocytes Absolute: 3.1 10*3/uL (ref 0.7–3.1)
Lymphs: 36 %
MCH: 28.7 pg (ref 26.6–33.0)
MCHC: 32.4 g/dL (ref 31.5–35.7)
MCV: 89 fL (ref 79–97)
Monocytes Absolute: 0.4 10*3/uL (ref 0.1–0.9)
Monocytes: 5 %
Neutrophils Absolute: 4.8 10*3/uL (ref 1.4–7.0)
Neutrophils: 55 %
Platelets: 314 10*3/uL (ref 150–379)
RBC: 4.56 x10E6/uL (ref 3.77–5.28)
RDW: 15 % (ref 12.3–15.4)
WBC: 8.8 10*3/uL (ref 3.4–10.8)

## 2015-01-15 LAB — VITAMIN B12: Vitamin B-12: 233 pg/mL (ref 211–946)

## 2015-01-15 LAB — LIPID PANEL W/O CHOL/HDL RATIO
Cholesterol, Total: 179 mg/dL (ref 100–199)
HDL: 37 mg/dL — ABNORMAL LOW (ref >39)
LDL Calculated: 119 mg/dL — ABNORMAL HIGH (ref 0–99)
Triglycerides: 117 mg/dL (ref 0–149)
VLDL Cholesterol Cal: 23 mg/dL (ref 5–40)

## 2015-01-15 LAB — TSH: TSH: 1.03 u[IU]/mL (ref 0.450–4.500)

## 2015-01-15 LAB — VITAMIN D 25 HYDROXY (VIT D DEFICIENCY, FRACTURES): Vit D, 25-Hydroxy: 17.9 ng/mL — ABNORMAL LOW (ref 30.0–100.0)

## 2015-01-16 ENCOUNTER — Telehealth: Payer: Self-pay | Admitting: Family Medicine

## 2015-01-16 NOTE — Telephone Encounter (Signed)
I left brief msg, calling about labs

## 2015-01-19 ENCOUNTER — Other Ambulatory Visit: Payer: Self-pay | Admitting: Family Medicine

## 2015-01-20 NOTE — Telephone Encounter (Signed)
rxs approved 

## 2015-01-23 MED ORDER — VITAMIN D (ERGOCALCIFEROL) 1.25 MG (50000 UNIT) PO CAPS: 50000.0000 [IU] | ORAL_CAPSULE | ORAL | Status: DC

## 2015-01-23 MED ORDER — VITAMIN B-12 1000 MCG PO TABS: 1000.0000 ug | ORAL_TABLET | Freq: Every day | ORAL | Status: DC

## 2015-01-23 NOTE — Telephone Encounter (Signed)
Patient returned your call about labs.

## 2015-01-23 NOTE — Telephone Encounter (Signed)
I spoke with patient about labs; B12 low, vit D low; start Rx vit D weekly x 4 weeks, then 2,000 iu daily

## 2015-01-23 NOTE — Telephone Encounter (Signed)
Pt called to discuss lab results with Dr. Sanda Klein. Please call pt when you get a chance. Thanks.

## 2015-02-12 ENCOUNTER — Encounter: Payer: No Typology Code available for payment source | Admitting: Family Medicine

## 2015-02-19 ENCOUNTER — Other Ambulatory Visit: Payer: Self-pay | Admitting: Family Medicine

## 2015-02-19 NOTE — Telephone Encounter (Signed)
Routing to provider  

## 2015-02-19 NOTE — Telephone Encounter (Signed)
Declined After the Rx, she's supposed to take OTC vitamin D3 2,000 iu daily

## 2015-02-19 NOTE — Telephone Encounter (Signed)
rx

## 2015-02-19 NOTE — Telephone Encounter (Signed)
Left message to call.

## 2015-02-20 NOTE — Telephone Encounter (Signed)
Left message to call.

## 2015-02-21 NOTE — Telephone Encounter (Signed)
Left message to call.

## 2015-02-24 NOTE — Telephone Encounter (Signed)
Left message to call.

## 2015-02-25 NOTE — Telephone Encounter (Signed)
Left message to call. Multiple attempts made to reach patient, no return phone calls.

## 2015-02-27 ENCOUNTER — Other Ambulatory Visit: Payer: Self-pay | Admitting: Family Medicine

## 2015-02-27 NOTE — Telephone Encounter (Signed)
Your patient.  Thanks 

## 2015-02-28 NOTE — Telephone Encounter (Signed)
Patient notified

## 2015-02-28 NOTE — Telephone Encounter (Signed)
Left message to call.

## 2015-02-28 NOTE — Telephone Encounter (Signed)
She was to do Rx vitamin D for just 4 weeks, then start OTC vitamin D3 and take 2,000 iu daily I'm denying the Rx She just needs OTC Please let her know; thank you

## 2015-03-04 ENCOUNTER — Other Ambulatory Visit: Payer: Self-pay | Admitting: Gastroenterology

## 2015-03-23 DIAGNOSIS — C4491 Basal cell carcinoma of skin, unspecified: Secondary | ICD-10-CM

## 2015-03-23 HISTORY — DX: Basal cell carcinoma of skin, unspecified: C44.91

## 2015-03-26 ENCOUNTER — Other Ambulatory Visit: Payer: Self-pay

## 2015-03-26 ENCOUNTER — Telehealth: Payer: Self-pay | Admitting: Gastroenterology

## 2015-03-26 DIAGNOSIS — K52831 Collagenous colitis: Secondary | ICD-10-CM

## 2015-03-26 MED ORDER — BUDESONIDE 3 MG PO CPEP: 9.0000 mg | ORAL_CAPSULE | Freq: Every day | ORAL | Status: DC

## 2015-03-26 NOTE — Telephone Encounter (Signed)
[phone] work # Patient left a voice message that she is waiting for you to call in that refill. She said she spoke to you last week about it.

## 2015-03-26 NOTE — Telephone Encounter (Signed)
Rx for budesonide 71m (3 tablets daily) sent to pt's pharmacy per her request.

## 2015-04-02 ENCOUNTER — Telehealth: Payer: Self-pay | Admitting: Gastroenterology

## 2015-04-02 NOTE — Telephone Encounter (Signed)
Patient left a voice message that you called her in a RX but her cost is 1000.00. Is there anything else you can perscribe?

## 2015-04-02 NOTE — Telephone Encounter (Signed)
Spoke with pt and advised her to contact her insurance company to see what would be cheaper for her. She will call me back as soon as she finds out to switch.

## 2015-05-12 ENCOUNTER — Other Ambulatory Visit: Payer: Self-pay

## 2015-05-12 NOTE — Telephone Encounter (Addendum)
90 Day Supply Request  Request for Metoprolol Succ ER 43m tab #90 and Bupropion 150 24hr tab #90  [place] [facility] [place]

## 2015-05-13 MED ORDER — METOPROLOL SUCCINATE ER 25 MG PO TB24: 25.0000 mg | ORAL_TABLET | Freq: Every day | ORAL | Status: DC

## 2015-05-13 MED ORDER — BUPROPION HCL ER (XL) 150 MG PO TB24: 150.0000 mg | ORAL_TABLET | Freq: Every day | ORAL | Status: DC

## 2015-05-13 NOTE — Telephone Encounter (Signed)
Please let Erine W Ethier know that I'd like to see patient for an appointment here in the office for:  Complete physical Please schedule a visit with me  in the next: few weeks Fasting?  Yes please Thank you, Dr. Sanda Klein I'm sending Rx as requested; new pharmacy added

## 2015-05-15 NOTE — Telephone Encounter (Signed)
Left vm on pt phone to call us back and schedule a physical with fasting labs and [facility] [place] for [facility] day supply.

## 2015-05-29 ENCOUNTER — Encounter: Payer: Self-pay | Admitting: Family Medicine

## 2015-05-29 NOTE — Telephone Encounter (Signed)
Letter sent home 05/29/15
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
## 2015-09-01 ENCOUNTER — Other Ambulatory Visit: Payer: Self-pay

## 2015-09-01 MED ORDER — METOPROLOL SUCCINATE ER 25 MG PO TB24: 25.0000 mg | ORAL_TABLET | Freq: Every day | ORAL | Status: DC

## 2015-09-16 ENCOUNTER — Telehealth: Payer: Self-pay

## 2015-09-16 NOTE — Telephone Encounter (Signed)
We got a refill request from patient's pharmacy on the Buproprion XL 131m. Unsure if patient is staying here or transferring to see Dr. LSanda Klein. Left message for patient to return my call.

## 2015-09-17 NOTE — Telephone Encounter (Signed)
Left message to call.

## 2015-09-25 NOTE — Telephone Encounter (Signed)
Left message to call.

## 2015-09-29 NOTE — Telephone Encounter (Signed)
Left message to call with a female.

## 2015-10-03 NOTE — Telephone Encounter (Signed)
Multiple attempts made to reach patient, no return calls.

## 2016-03-04 ENCOUNTER — Other Ambulatory Visit: Payer: Self-pay | Admitting: Family Medicine

## 2016-03-10 ENCOUNTER — Other Ambulatory Visit: Payer: Self-pay | Admitting: Family Medicine

## 2016-03-23 ENCOUNTER — Telehealth: Payer: Self-pay | Admitting: Family Medicine

## 2016-03-23 MED ORDER — METOPROLOL SUCCINATE ER 25 MG PO TB24: 25.0000 mg | ORAL_TABLET | Freq: Every day | ORAL | 0 refills | Status: DC

## 2016-03-23 NOTE — Telephone Encounter (Signed)
Rx sent to her pharmacy 

## 2016-03-23 NOTE — Telephone Encounter (Signed)
Patient was seen here by Dr. Sanda Klein but has scheduled an appt with Dr Wynetta Emery on 1/11 and needs a refill on her BP medication.  I told her I would send this message back but was unsure if this could be done before seeing Dr Wynetta Emery.  Thank You Santiago Glad

## 2016-03-23 NOTE — Telephone Encounter (Signed)
Glitch in computer system; I was not getting refill requests; addressing today ---------------------- She is going to see Dr. Wynetta Emery

## 2016-04-01 ENCOUNTER — Encounter: Payer: Self-pay | Admitting: Family Medicine

## 2016-04-08 ENCOUNTER — Ambulatory Visit: Payer: No Typology Code available for payment source | Admitting: Family Medicine

## 2016-04-19 ENCOUNTER — Other Ambulatory Visit: Payer: Self-pay | Admitting: Family Medicine

## 2016-04-19 NOTE — Telephone Encounter (Signed)
Looks like she has an appointment tomorrow. Hope you're doing well!

## 2016-04-20 ENCOUNTER — Ambulatory Visit (INDEPENDENT_AMBULATORY_CARE_PROVIDER_SITE_OTHER): Payer: BLUE CROSS/BLUE SHIELD | Admitting: Family Medicine

## 2016-04-20 ENCOUNTER — Encounter: Payer: Self-pay | Admitting: Family Medicine

## 2016-04-20 ENCOUNTER — Other Ambulatory Visit: Payer: Self-pay

## 2016-04-20 ENCOUNTER — Telehealth: Payer: Self-pay | Admitting: Gastroenterology

## 2016-04-20 VITALS — BP 136/73 | HR 98 | Temp 98.8°F | Resp 17 | Ht 66.0 in | Wt 218.3 lb

## 2016-04-20 DIAGNOSIS — Z6835 Body mass index (BMI) 35.0-35.9, adult: Secondary | ICD-10-CM

## 2016-04-20 DIAGNOSIS — I10 Essential (primary) hypertension: Secondary | ICD-10-CM

## 2016-04-20 DIAGNOSIS — Z720 Tobacco use: Secondary | ICD-10-CM | POA: Diagnosis not present

## 2016-04-20 DIAGNOSIS — Z862 Personal history of diseases of the blood and blood-forming organs and certain disorders involving the immune mechanism: Secondary | ICD-10-CM | POA: Diagnosis not present

## 2016-04-20 DIAGNOSIS — E559 Vitamin D deficiency, unspecified: Secondary | ICD-10-CM | POA: Diagnosis not present

## 2016-04-20 DIAGNOSIS — E6609 Other obesity due to excess calories: Secondary | ICD-10-CM

## 2016-04-20 DIAGNOSIS — K52831 Collagenous colitis: Secondary | ICD-10-CM

## 2016-04-20 DIAGNOSIS — L404 Guttate psoriasis: Secondary | ICD-10-CM | POA: Diagnosis not present

## 2016-04-20 HISTORY — DX: Guttate psoriasis: L40.4

## 2016-04-20 MED ORDER — CLOBETASOL PROPIONATE 0.05 % EX CREA: 1.0000 "application " | TOPICAL_CREAM | Freq: Two times a day (BID) | CUTANEOUS | 0 refills | Status: DC

## 2016-04-20 MED ORDER — BUDESONIDE 3 MG PO CPEP: 9.0000 mg | ORAL_CAPSULE | Freq: Every day | ORAL | 3 refills | Status: DC

## 2016-04-20 MED ORDER — METOPROLOL SUCCINATE ER 25 MG PO TB24: 25.0000 mg | ORAL_TABLET | Freq: Every day | ORAL | 3 refills | Status: DC

## 2016-04-20 MED ORDER — BUPROPION HCL ER (XL) 150 MG PO TB24: 150.0000 mg | ORAL_TABLET | Freq: Every day | ORAL | 3 refills | Status: DC

## 2016-04-20 NOTE — Telephone Encounter (Signed)
Pt notified rx has been sent to pharmacy.

## 2016-04-20 NOTE — Patient Instructions (Addendum)
I do recommend yearly flu shots; for individuals who don't want flu shots, try to practice excellent hand hygiene, and avoid nursing homes, day cares, and hospitals during peak flu season; taking additional vitamin C daily during flu/cold season [place] help boost your immune system too  I do encourage you to quit smoking Call [phone] to sign up for smoking cessation classes You can call 1-800-QUIT-NOW to talk with a smoking cessation coach  Continue medicines Return for a complete physical in the next several weeks

## 2016-04-20 NOTE — Assessment & Plan Note (Signed)
Patient not ready to quit; see AVS; I am here to help if/when ready

## 2016-04-20 NOTE — Assessment & Plan Note (Signed)
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

## 2016-04-20 NOTE — Assessment & Plan Note (Signed)
Continue med, DASH guidelines

## 2016-04-20 NOTE — Assessment & Plan Note (Signed)
Check labs soon

## 2016-04-20 NOTE — Assessment & Plan Note (Signed)
Check CBC soon; increase iron-rich foods

## 2016-04-20 NOTE — Assessment & Plan Note (Signed)
Praise given for weight loss and healthy eating

## 2016-04-20 NOTE — Telephone Encounter (Signed)
Patient stated she needs a refill on Budesonide [facility] [place]

## 2016-04-20 NOTE — Progress Notes (Signed)
BP 136/73 (BP Location: Right Arm, Patient Position: Sitting, Cuff Size: Large)   Pulse 98   Temp 98.8 F (37.1 C) (Oral)   Resp 17   Ht 5' 6"  (1.676 m)   Wt 218 lb 4.8 oz (99 kg)   SpO2 97%   BMI 35.23 kg/m    Subjective:    [name], female    DOB: [birth year], 51 y.o.   MRN: [id number]  HPI: Susan Snyder is a 51 y.o. female  Chief Complaint  Patient presents with  . Follow-up    BP  . Medication Refill    bupropion 150 mg / metoprolol 25 mg    Here for f/u She has not had a flu shot; does not want one (declined today)  Taking metoprolol for palpitations and blood pressure; had some fatigue when first taking it; takes it at night and that solved the problem; needs refills today  Taking bupropion; no thoughts of SI/HI; taking that in the morning  Still hasn't quit smoking; "I want to quit" and then chain smokes; she loves them too much; she won't finish them all the time; honest with herself and doctor  She is working on weight loss; stress of moving lately, then started losing; went back to healthy eating; cutting out soft drinks  Depression screen [facility] 2/9 04/20/2016 12/23/2014  Decreased Interest 0 0  Down, Depressed, Hopeless 0 1  PHQ - 2 Score 0 1   Relevant past medical, surgical, family and social history reviewed Past Medical History:  Diagnosis Date  . Chronic diarrhea   . Chronic gastritis Sept. 2015   on EGD   . Collagenous colitis    colonoscopy biopsies 11/01/13  . Hyperplastic polyps of stomach    Aug 2015  . Hypertension   . Renal cyst    Past Surgical History:  Procedure Laterality Date  . ABDOMINAL HYSTERECTOMY  2005   partial, still has ovaries, due to fibroids.  Marland Kitchen [place]   x 4  . COLONOSCOPY W/ BIOPSIES  11/01/13   collagenous colitis, sigmoid polypectomy x 2; hyperplastic polyps  . ESOPHAGOGASTRODUODENOSCOPY  11/22/13   chronic gastritis  . HIP ARTHROPLASTY Bilateral 01/15/14,03/05/14   total hip,  Dr. Nilsa Nutting  . NECK SURGERY  2015   Family History  Problem Relation Age of Onset  . Stroke Mother   . Glaucoma Mother   . Heart disease Father   . Cancer Father     prostate   Social History  Substance Use Topics  . Smoking status: Current Every Day Smoker    Packs/day: 0.50    Types: Cigarettes  . Smokeless tobacco: Never Used  . Alcohol use No   Interim medical history since last visit reviewed. Allergies and medications reviewed  Review of Systems Per HPI unless specifically indicated above     Objective:    BP 136/73 (BP Location: Right Arm, Patient Position: Sitting, Cuff Size: Large)   Pulse 98   Temp 98.8 F (37.1 C) (Oral)   Resp 17   Ht 5' 6"  (1.676 m)   Wt 218 lb 4.8 oz (99 kg)   SpO2 97%   BMI 35.23 kg/m   Wt Readings from Last 3 Encounters:  04/20/16 218 lb 4.8 oz (99 kg)  01/14/15 219 lb (99.3 kg)  12/23/14 221 lb (100.2 kg)    Physical Exam  Constitutional: She appears well-developed and well-nourished.  HENT:  Mouth/Throat: Mucous membranes are  normal.  Eyes: EOM are normal. No scleral icterus.  Cardiovascular: Normal rate and regular rhythm.   Pulmonary/Chest: Effort normal and breath sounds normal.  Skin:  Numerous guttate erythematous lesions on legs, slight overlying scale; few nail ridges  Psychiatric: She has a normal mood and affect. Her behavior is normal.    Results for orders placed or performed in visit on 01/14/15  Vitamin B12  Result Value Ref Range   Vitamin B-12 233 211 - 946 pg/mL  Vit D  25 hydroxy (rtn osteoporosis monitoring)  Result Value Ref Range   Vit D, 25-Hydroxy 17.9 (L) 30.0 - 100.0 ng/mL  TSH  Result Value Ref Range   TSH 1.030 0.450 - 4.500 uIU/mL  Comprehensive metabolic panel  Result Value Ref Range   Glucose 79 65 - 99 mg/dL   BUN 13 6 - 24 mg/dL   Creatinine, Ser 0.78 0.57 - 1.00 mg/dL   GFR calc non Af Amer 89 >59 mL/min/1.73   GFR calc Af Amer 103 >59 mL/min/1.73   BUN/Creatinine Ratio 17 9  - 23   Sodium 141 136 - 144 mmol/L   Potassium 5.0 3.5 - 5.2 mmol/L   Chloride 100 97 - 106 mmol/L   CO2 25 18 - 29 mmol/L   Calcium 9.2 8.7 - 10.2 mg/dL   Total Protein 6.5 6.0 - 8.5 g/dL   Albumin 4.0 3.5 - 5.5 g/dL   Globulin, Total 2.5 1.5 - 4.5 g/dL   Albumin/Globulin Ratio 1.6 1.1 - 2.5   Bilirubin Total 0.3 0.0 - 1.2 mg/dL   Alkaline Phosphatase 92 39 - 117 IU/L   AST 15 0 - 40 IU/L   ALT 11 0 - 32 IU/L  CBC with Differential/Platelet  Result Value Ref Range   WBC 8.8 3.4 - 10.8 x10E3/uL   RBC 4.56 3.77 - 5.28 x10E6/uL   Hemoglobin 13.1 11.1 - 15.9 g/dL   Hematocrit 40.4 34.0 - 46.6 %   MCV 89 79 - 97 fL   MCH 28.7 26.6 - 33.0 pg   MCHC 32.4 31.5 - 35.7 g/dL   RDW 15.0 12.3 - 15.4 %   Platelets 314 150 - 379 x10E3/uL   Neutrophils 55 %   Lymphs 36 %   Monocytes 5 %   Eos 4 %   Basos 0 %   Neutrophils Absolute 4.8 1.4 - 7.0 x10E3/uL   Lymphocytes Absolute 3.1 0.7 - 3.1 x10E3/uL   Monocytes Absolute 0.4 0.1 - 0.9 x10E3/uL   EOS (ABSOLUTE) 0.4 0.0 - 0.4 x10E3/uL   Basophils Absolute 0.0 0.0 - 0.2 x10E3/uL   Immature Granulocytes 0 %   Immature Grans (Abs) 0.0 0.0 - 0.1 x10E3/uL  Lipid Panel w/o Chol/HDL Ratio  Result Value Ref Range   Cholesterol, Total 179 100 - 199 mg/dL   Triglycerides 117 0 - 149 mg/dL   HDL 37 (L) >39 mg/dL   VLDL Cholesterol Cal 23 5 - 40 mg/dL   LDL Calculated 119 (H) 0 - 99 mg/dL  Ferritin  Result Value Ref Range   Ferritin 72 15 - 150 ng/mL      Assessment & Plan:   Problem List Items Addressed This Visit      Cardiovascular and Mediastinum   Hypertension    Continue med, DASH guidelines      Relevant Medications   metoprolol succinate (TOPROL-XL) 25 MG 24 hr tablet     Musculoskeletal and Integument   Guttate psoriasis    Lesions appear to be guttate  psoriasis; does not appear to be bed bugs or allergic reaction, to allay her concerns; will try corticosteroid cream; if not improving in a few weeks, will refer to derm for  evaluation        Other   Vitamin D deficiency    Check labs soon      Tobacco abuse    Patient not ready to quit; see AVS; I am here to help if/when ready      Obesity - Primary    Praise given for weight loss and healthy eating      Hx of iron deficiency anemia    Check CBC soon; increase iron-rich foods         Follow up plan: Return in about 4 weeks (around 05/18/2016) for complete physical.  An after-visit summary was printed and given to the patient at [place].  Please see the patient instructions which [place] contain other information and recommendations beyond what is mentioned above in the assessment and plan.  Meds ordered this encounter  Medications  . metoprolol succinate (TOPROL-XL) 25 MG 24 hr tablet    Sig: Take 1 tablet (25 mg total) by mouth daily.    Dispense:  90 tablet    Refill:  3  . buPROPion (WELLBUTRIN XL) 150 MG 24 hr tablet    Sig: Take 1 tablet (150 mg total) by mouth daily.    Dispense:  90 tablet    Refill:  3  . clobetasol cream (TEMOVATE) 0.05 %    Sig: Apply 1 application topically 2 (two) times daily.    Dispense:  30 g    Refill:  0    No orders of the defined types were placed in this encounter.

## 2016-04-22 ENCOUNTER — Other Ambulatory Visit: Payer: Self-pay | Admitting: Family Medicine

## 2016-04-22 MED ORDER — CLOBETASOL PROPIONATE 0.05 % EX OINT: 1.0000 "application " | TOPICAL_OINTMENT | Freq: Two times a day (BID) | CUTANEOUS | 0 refills | Status: DC

## 2016-04-22 NOTE — Progress Notes (Signed)
Insurance company won't cover cream; will cover ointment; new Rx sent

## 2016-04-23 ENCOUNTER — Telehealth: Payer: Self-pay

## 2016-04-23 ENCOUNTER — Telehealth: Payer: Self-pay | Admitting: Gastroenterology

## 2016-04-23 MED ORDER — TRIAMCINOLONE ACETONIDE 0.1 % EX CREA: 1.0000 "application " | TOPICAL_CREAM | Freq: Two times a day (BID) | CUTANEOUS | 1 refills | Status: DC

## 2016-04-23 NOTE — Telephone Encounter (Signed)
Please advise if there is anything we can switch her to. Its $1200.00.

## 2016-04-23 NOTE — Telephone Encounter (Signed)
Yes, new Rx sent; thank you

## 2016-04-23 NOTE — Telephone Encounter (Signed)
Patient called states the temovate cream is to exspensive can you send something else?

## 2016-04-23 NOTE — Telephone Encounter (Signed)
Patient stated that her insurance is not covering Budesonide. Please call [phone]

## 2016-04-27 NOTE — Telephone Encounter (Signed)
Her old chart is not on hepatic and I am not sure what she is being treated for. If it is for microscopic colitis which I think it is then there is not any other medications that have been shown to work for this except Imodium to treat the symptoms.

## 2016-04-30 NOTE — Telephone Encounter (Signed)
Left vm for pt to schedule a follow up appt as she has not been seen in 2 years. Medication refill. [facility] need to switch medication due to cost.

## 2016-04-30 NOTE — Telephone Encounter (Signed)
Pt has collagenous colitis. Maybe we should see her back in the office. Its been some time since her last appt.

## 2016-05-20 ENCOUNTER — Encounter: Payer: Self-pay | Admitting: Family Medicine

## 2016-05-20 ENCOUNTER — Ambulatory Visit (INDEPENDENT_AMBULATORY_CARE_PROVIDER_SITE_OTHER): Payer: BLUE CROSS/BLUE SHIELD | Admitting: Family Medicine

## 2016-05-20 VITALS — BP 126/74 | HR 100 | Temp 98.2°F | Resp 16 | Ht 67.0 in | Wt 218.4 lb

## 2016-05-20 DIAGNOSIS — Z1239 Encounter for other screening for malignant neoplasm of breast: Secondary | ICD-10-CM

## 2016-05-20 DIAGNOSIS — D485 Neoplasm of uncertain behavior of skin: Secondary | ICD-10-CM

## 2016-05-20 DIAGNOSIS — Z114 Encounter for screening for human immunodeficiency virus [HIV]: Secondary | ICD-10-CM

## 2016-05-20 DIAGNOSIS — E559 Vitamin D deficiency, unspecified: Secondary | ICD-10-CM | POA: Diagnosis not present

## 2016-05-20 DIAGNOSIS — E538 Deficiency of other specified B group vitamins: Secondary | ICD-10-CM

## 2016-05-20 DIAGNOSIS — Z720 Tobacco use: Secondary | ICD-10-CM

## 2016-05-20 DIAGNOSIS — Z0000 Encounter for general adult medical examination without abnormal findings: Secondary | ICD-10-CM

## 2016-05-20 DIAGNOSIS — Z23 Encounter for immunization: Secondary | ICD-10-CM | POA: Diagnosis not present

## 2016-05-20 DIAGNOSIS — Z1231 Encounter for screening mammogram for malignant neoplasm of breast: Secondary | ICD-10-CM | POA: Diagnosis not present

## 2016-05-20 HISTORY — DX: Neoplasm of uncertain behavior of skin: D48.5

## 2016-05-20 LAB — COMPLETE METABOLIC PANEL WITH GFR
ALT: 12 U/L (ref 6–29)
AST: 13 U/L (ref 10–35)
Albumin: 4.1 g/dL (ref 3.6–5.1)
Alkaline Phosphatase: 78 U/L (ref 33–130)
BUN: 15 mg/dL (ref 7–25)
CO2: 28 mmol/L (ref 20–31)
Calcium: 9.4 mg/dL (ref 8.6–10.4)
Chloride: 107 mmol/L (ref 98–110)
Creat: 0.88 mg/dL (ref 0.50–1.05)
GFR, Est African American: 88 mL/min (ref >=60)
GFR, Est Non African American: 76 mL/min (ref >=60)
Glucose, Bld: 91 mg/dL (ref 65–99)
Potassium: 4.7 mmol/L (ref 3.5–5.3)
Sodium: 140 mmol/L (ref 135–146)
Total Bilirubin: 0.4 mg/dL (ref 0.2–1.2)
Total Protein: 6.5 g/dL (ref 6.1–8.1)

## 2016-05-20 LAB — CBC WITH DIFFERENTIAL/PLATELET
Basophils Absolute: 0 cells/uL (ref 0–200)
Basophils Relative: 0 %
Eosinophils Absolute: 428 cells/uL (ref 15–500)
Eosinophils Relative: 4 %
HCT: 41.4 % (ref 35.0–45.0)
Hemoglobin: 13.7 g/dL (ref 11.7–15.5)
Lymphocytes Relative: 33 %
Lymphs Abs: 3531 cells/uL (ref 850–3900)
MCH: 29.7 pg (ref 27.0–33.0)
MCHC: 33.1 g/dL (ref 32.0–36.0)
MCV: 89.8 fL (ref 80.0–100.0)
MPV: 10.2 fL (ref 7.5–12.5)
Monocytes Absolute: 535 cells/uL (ref 200–950)
Monocytes Relative: 5 %
Neutro Abs: 6206 cells/uL (ref 1500–7800)
Neutrophils Relative %: 58 %
Platelets: 323 10*3/uL (ref 140–400)
RBC: 4.61 MIL/uL (ref 3.80–5.10)
RDW: 14.8 % (ref 11.0–15.0)
WBC: 10.7 10*3/uL (ref 3.8–10.8)

## 2016-05-20 LAB — VITAMIN B12: Vitamin B-12: 443 pg/mL (ref 200–1100)

## 2016-05-20 LAB — LIPID PANEL
Cholesterol: 190 mg/dL (ref <200)
HDL: 35 mg/dL — ABNORMAL LOW (ref >50)
LDL Cholesterol: 131 mg/dL — ABNORMAL HIGH (ref <100)
Total CHOL/HDL Ratio: 5.4 Ratio — ABNORMAL HIGH (ref <5.0)
Triglycerides: 120 mg/dL (ref <150)
VLDL: 24 mg/dL (ref <30)

## 2016-05-20 LAB — TSH: TSH: 1.4 mIU/L

## 2016-05-20 MED ORDER — TRIAMCINOLONE ACETONIDE 0.1 % EX CREA: 1.0000 "application " | TOPICAL_CREAM | Freq: Two times a day (BID) | CUTANEOUS | 1 refills | Status: DC

## 2016-05-20 NOTE — Assessment & Plan Note (Signed)
USPSTF grade A and B recommendations reviewed with patient; age-appropriate recommendations, preventive care, screening tests, etc discussed and encouraged; healthy living encouraged; see AVS for patient education given to patient  

## 2016-05-20 NOTE — Assessment & Plan Note (Signed)
Refer to derm, hx of BCC, several places she'd like checked

## 2016-05-20 NOTE — Progress Notes (Signed)
[name], female   DOB: 05-Dec-1965, 51 y.o.   MRN: [id number]   Subjective:   Susan Snyder is a 51 y.o. female here for a complete physical exam  Interim issues since last visit: seeing Dr. Allen Norris for colitis  USPSTF grade A and B recommendations Alcohol: rare Depression:  Depression screen [facility] 2/9 05/20/2016 04/20/2016 12/23/2014  Decreased Interest 0 0 0  Down, Depressed, Hopeless 0 0 1  PHQ - 2 Score 0 0 1  Hypertension: controlled  Obesity: yes Tobacco use: able to quit for 6 years once; smoking 1/2 pack or more a day; lights some and puts them out, wastes more than she smokes HIV, hep B, hep C: check STD testing and prevention (chl/gon/syphilis): not active Lipids: check today fasting Glucose: check today fasting Colorectal cancer: 2015; Dr. Allen Norris Breast cancer: mammo due, ordered today BRCA gene screening: no breast or ovarian cancer Intimate partner violence: no abuse Cervical cancer screening: s/p hysterectomy, not sure if cervix was removed; fibroids, had had cancerous cervical cells at one point; 2005 cannot be right she says; had to be earlier; they did one pap smear after the surgery, and it was okay Lung cancer: start year CT of chest at age 57 Osteoporosis: n/a Fall prevention/vitamin D: low previously AAA: n/a Aspirin: no taking aspirin right now Diet: working on it, limit soft drinks, limiting fatty foods Exercise: walking, not a whole lot; had hips done, now having trouble with knee Skin cancer: has had one removed on face, one on back, one on right shoulder; sees derm in [place]  Hx of anemia, energy level is blah Colitis, budesonide $800 a month; taking imodium; taking B12; taking vit D too  Past Medical History:  Diagnosis Date  . Chronic diarrhea   . Chronic gastritis Sept. 2015   on EGD   . Collagenous colitis    colonoscopy biopsies 11/01/13  . Hyperplastic polyps of stomach    Aug 2015  . Hypertension   . Neoplasm of uncertain  behavior of skin 05/20/2016   Hx of skin cancer, BCC  . Renal cyst    Past Surgical History:  Procedure Laterality Date  . ABDOMINAL HYSTERECTOMY  2005   partial, still has ovaries, due to fibroids.  Marland Kitchen [place]   x 4  . COLONOSCOPY W/ BIOPSIES  11/01/13   collagenous colitis, sigmoid polypectomy x 2; hyperplastic polyps  . ESOPHAGOGASTRODUODENOSCOPY  11/22/13   chronic gastritis  . HIP ARTHROPLASTY Bilateral 01/15/14,03/05/14   total hip, Dr. Nilsa Nutting  . NECK SURGERY  2015   Family History  Problem Relation Age of Onset  . Stroke Mother   . Glaucoma Mother   . Heart disease Father   . Cancer Father     prostate  . Heart disease Sister   . Diabetes Sister   . Diabetes Paternal Aunt   . Heart disease Paternal Aunt   . Bleeding Disorder Paternal Uncle   . Diabetes Paternal Aunt   . Heart disease Paternal Aunt   MD note: strong family hx of diabetes, heart disease  Social History  Substance Use Topics  . Smoking status: Current Every Day Smoker    Packs/day: 0.50    Types: Cigarettes  . Smokeless tobacco: Never Used  . Alcohol use No   Review of Systems  Constitutional: Positive for fatigue.  Hematological: Bruises/bleeds easily.    Objective:   Vitals:   05/20/16 1059  BP: 126/74  Pulse:  100  Resp: 16  Temp: 98.2 F (36.8 C)  TempSrc: Oral  SpO2: 98%  Weight: 218 lb 6 oz (99.1 kg)  Height: 5' 7" (1.702 m)   Body mass index is 34.2 kg/m. Wt Readings from Last 3 Encounters:  05/20/16 218 lb 6 oz (99.1 kg)  04/20/16 218 lb 4.8 oz (99 kg)  01/14/15 219 lb (99.3 kg)   Physical Exam  Constitutional: She appears well-developed and well-nourished.  HENT:  Head: Normocephalic and atraumatic.  Right Ear: Hearing, tympanic membrane, external ear and ear canal normal.  Left Ear: Hearing, tympanic membrane, external ear and ear canal normal.  Eyes: Conjunctivae and EOM are normal. Right eye exhibits no hordeolum. Left eye exhibits no  hordeolum. No scleral icterus.  Neck: Carotid bruit is not present. No thyromegaly present.  Cardiovascular: Normal rate, regular rhythm, S1 normal, S2 normal and normal heart sounds.   No extrasystoles are present.  Pulmonary/Chest: Effort normal and breath sounds normal. No respiratory distress. Right breast exhibits no inverted nipple, no mass, no nipple discharge, no skin change and no tenderness. Left breast exhibits no inverted nipple, no mass, no nipple discharge, no skin change and no tenderness. Breasts are symmetrical.  Abdominal: Soft. Normal appearance and bowel sounds are normal. She exhibits no distension, no abdominal bruit, no pulsatile midline mass and no mass. There is no hepatosplenomegaly. There is no tenderness. No hernia.  Musculoskeletal: Normal range of motion. She exhibits no edema.  Lymphadenopathy:       Head (right side): No submandibular adenopathy present.       Head (left side): No submandibular adenopathy present.    She has no cervical adenopathy.    She has no axillary adenopathy.  Neurological: She is alert. She displays no tremor. No cranial nerve deficit. She exhibits normal muscle tone. Gait normal.  Reflex Scores:      Patellar reflexes are 2+ on the right side and 2+ on the left side. Skin: Skin is warm and dry. Rash (guttate papular rash on the legs) noted. No bruising and no ecchymosis noted. No cyanosis. No pallor.  Skin tag LEFT axilla; single cherry angioma left areola  Psychiatric: Her speech is normal and behavior is normal. Thought content normal. Her mood appears not anxious. Her affect is not blunt. She does not exhibit a depressed mood.    Assessment/Plan:   Problem List Items Addressed This Visit      Musculoskeletal and Integument   Neoplasm of uncertain behavior of skin    Refer to derm, hx of BCC, several places she'd like checked      Relevant Orders   Ambulatory referral to Dermatology     Other   Vitamin D deficiency    Check  level and consider Rx if needed      Relevant Orders   VITAMIN D 25 Hydroxy (Vit-D Deficiency, Fractures)   Vitamin B12 deficiency    Check level, consider SL if not over 400      Relevant Orders   Vitamin B12   Tobacco abuse    Urged patient to quit smoking; she is not ready right now; I am here to help if/when ready      Preventative health care - Primary    USPSTF grade A and B recommendations reviewed with patient; age-appropriate recommendations, preventive care, screening tests, etc discussed and encouraged; healthy living encouraged; see AVS for patient education given to patient      Relevant Orders   Lipid panel  CBC with Differential/Platelet   COMPLETE METABOLIC PANEL WITH GFR   TSH    Other Visit Diagnoses    Need for diphtheria-tetanus-pertussis (Tdap) vaccine       Relevant Orders   Tdap vaccine greater than or equal to 7yo IM   Screening for breast cancer       Relevant Orders   MM Digital Screening   Screening for HIV (human immunodeficiency virus)       Relevant Orders   HIV antibody (with reflex)      Meds ordered this encounter  Medications  . loperamide (IMODIUM) 2 MG capsule    Sig: Take 2 mg by mouth 2 (two) times daily.  Marland Kitchen triamcinolone cream (KENALOG) 0.1 %    Sig: Apply 1 application topically 2 (two) times daily. As needed to rash    Dispense:  453.6 g    Refill:  1   Orders Placed This Encounter  Procedures  . MM Digital Screening    Standing Status:   Future    Standing Expiration Date:   07/20/2017    Order Specific Question:   Reason for Exam (SYMPTOM  OR DIAGNOSIS REQUIRED)    Answer:   yearly breast screening    Order Specific Question:   Is the patient pregnant?    Answer:   No    Order Specific Question:   Preferred imaging location?    Answer:   [facility]  . Tdap vaccine greater than or equal to 7yo IM  . Lipid panel  . CBC with Differential/Platelet  . COMPLETE METABOLIC PANEL WITH GFR  . TSH  . Vitamin B12  .  VITAMIN D 25 Hydroxy (Vit-D Deficiency, Fractures)  . HIV antibody (with reflex)  . Ambulatory referral to Dermatology    Referral Priority:   Routine    Referral Type:   Consultation    Referral Reason:   Specialty Services Required    Requested Specialty:   Dermatology    Number of Visits Requested:   1    Follow up plan: Return in about 1 year (around 05/20/2017) for complete physical.  An After Visit Summary was printed and given to the patient.

## 2016-05-20 NOTE — Assessment & Plan Note (Signed)
Check level and consider Rx if needed

## 2016-05-20 NOTE — Assessment & Plan Note (Signed)
Check level, consider SL if not over 400

## 2016-05-20 NOTE — Patient Instructions (Addendum)
I do encourage you to quit smoking Call [phone] to sign up for smoking cessation classes You can call 1-800-QUIT-NOW to talk with a smoking cessation coach  You received the vaccine to protect against tetanus and diphtheria and pertussis today; the tetanus and diphtheria portions will provide protection up to ten years, and the pertussis component will give you protection against whooping cough for life  Request copy of operative note from [facility] (hysterectomy)  Steps to Quit Smoking Smoking tobacco can be bad for your health. It can also affect almost every organ in your body. Smoking puts you and people around you at risk for many serious long-lasting (chronic) diseases. Quitting smoking is hard, but it is one of the best things that you can do for your health. It is never too late to quit. What are the benefits of quitting smoking? When you quit smoking, you lower your risk for getting serious diseases and conditions. They can include:  Lung cancer or lung disease.  Heart disease.  Stroke.  Heart attack.  Not being able to have children (infertility).  Weak bones (osteoporosis) and broken bones (fractures). If you have coughing, wheezing, and shortness of breath, those symptoms Apt get better when you quit. You Challis also get sick less often. If you are pregnant, quitting smoking can help to lower your chances of having a baby of low birth weight. What can I do to help me quit smoking? Talk with your doctor about what can help you quit smoking. Some things you can do (strategies) include:  Quitting smoking totally, instead of slowly cutting back how much you smoke over a period of time.  Going to in-person counseling. You are more likely to quit if you go to many counseling sessions.  Using resources and support systems, such as:  Online chats with a Social worker.  Phone quitlines.  Printed Furniture conservator/restorer.  Support groups or group counseling.  Text messaging  programs.  Mobile phone apps or applications.  Taking medicines. Some of these medicines Vanzanten have nicotine in them. If you are pregnant or breastfeeding, do not take any medicines to quit smoking unless your doctor says it is okay. Talk with your doctor about counseling or other things that can help you. Talk with your doctor about using more than one strategy at the same time, such as taking medicines while you are also going to in-person counseling. This can help make quitting easier. What things can I do to make it easier to quit? Quitting smoking might feel very hard at first, but there is a lot that you can do to make it easier. Take these steps:  Talk to your family and friends. Ask them to support and encourage you.  Call phone quitlines, reach out to support groups, or work with a Social worker.  Ask people who smoke to not smoke around you.  Avoid places that make you want (trigger) to smoke, such as:  Bars.  Parties.  Smoke-break areas at work.  Spend time with people who do not smoke.  Lower the stress in your life. Stress can make you want to smoke. Try these things to help your stress:  Getting regular exercise.  Deep-breathing exercises.  Yoga.  Meditating.  Doing a body scan. To do this, close your eyes, focus on one area of your body at a time from head to toe, and notice which parts of your body are tense. Try to relax the muscles in those areas.  Download or buy apps on your  mobile phone or tablet that can help you stick to your quit plan. There are many free apps, such as QuitGuide from the [employer] Office manager for Disease Control and Prevention). You can find more support from smokefree.gov and other websites. This information is not intended to replace advice given to you by your health care provider. Make sure you discuss any questions you have with your health care provider. Document Released: 01/02/2009 Document Revised: 11/04/2015 Document Reviewed:  07/23/2014 Elsevier Interactive Patient Education  2017 [place] Maintenance, Female Adopting a healthy lifestyle and getting preventive care can go a long way to promote health and wellness. Talk with your health care provider about what schedule of regular examinations is right for you. This is a good chance for you to check in with your provider about disease prevention and staying healthy. In between checkups, there are plenty of things you can do on your own. Experts have done a lot of research about which lifestyle changes and preventive measures are most likely to keep you healthy. Ask your health care provider for more information. Weight and diet Eat a healthy diet  Be sure to include plenty of vegetables, fruits, low-fat dairy products, and lean protein.  Do not eat a lot of foods high in solid fats, added sugars, or salt.  Get regular exercise. This is one of the most important things you can do for your health.  Most adults should exercise for at least 150 minutes each week. The exercise should increase your heart rate and make you sweat (moderate-intensity exercise).  Most adults should also do strengthening exercises at least twice a week. This is in addition to the moderate-intensity exercise. Maintain a healthy weight  Body mass index (BMI) is a measurement that can be used to identify possible weight problems. It estimates body fat based on height and weight. Your health care provider can help determine your BMI and help you achieve or maintain a healthy weight.  For females 74 years of age and older:  A BMI below 18.5 is considered underweight.  A BMI of 18.5 to 24.9 is normal.  A BMI of 25 to 29.9 is considered overweight.  A BMI of 30 and above is considered obese. Watch levels of cholesterol and blood lipids  You should start having your blood tested for lipids and cholesterol at 52 years of age, then have this test every 5 years.  You [place] need to  have your cholesterol levels checked more often if:  Your lipid or cholesterol levels are high.  You are older than 52 years of age.  You are at high risk for heart disease. Cancer screening Lung Cancer  Lung cancer screening is recommended for adults 36-57 years old who are at high risk for lung cancer because of a history of smoking.  A yearly low-dose CT scan of the lungs is recommended for people who:  Currently smoke.  Have quit within the past 15 years.  Have at least a 30-pack-year history of smoking. A pack year is smoking an average of one pack of cigarettes a day for 1 year.  Yearly screening should continue until it has been 15 years since you quit.  Yearly screening should stop if you develop a health problem that would prevent you from having lung cancer treatment. Breast Cancer  Practice breast self-awareness. This means understanding how your breasts normally appear and feel.  It also means doing regular breast self-exams. Let your health care provider know about any changes,  no matter how small.  If you are in your 20s or 30s, you should have a clinical breast exam (CBE) by a health care provider every 1-3 years as part of a regular health exam.  If you are 35 or older, have a CBE every year. Also consider having a breast X-ray (mammogram) every year.  If you have a family history of breast cancer, talk to your health care provider about genetic screening.  If you are at high risk for breast cancer, talk to your health care provider about having an MRI and a mammogram every year.  Breast cancer gene (BRCA) assessment is recommended for women who have family members with BRCA-related cancers. BRCA-related cancers include:  Breast.  Ovarian.  Tubal.  Peritoneal cancers.  Results of the assessment will determine the need for genetic counseling and BRCA1 and BRCA2 testing. Cervical Cancer  Your health care provider Moehle recommend that you be screened  regularly for cancer of the pelvic organs (ovaries, uterus, and vagina). This screening involves a pelvic examination, including checking for microscopic changes to the surface of your cervix (Pap test). You Brander be encouraged to have this screening done every 3 years, beginning at age 22.  For women ages 30-65, health care providers Menges recommend pelvic exams and Pap testing every 3 years, or they Breton recommend the Pap and pelvic exam, combined with testing for human papilloma virus (HPV), every 5 years. Some types of HPV increase your risk of cervical cancer. Testing for HPV Savo also be done on women of any age with unclear Pap test results.  Other health care providers Shipes not recommend any screening for nonpregnant women who are considered low risk for pelvic cancer and who do not have symptoms. Ask your health care provider if a screening pelvic exam is right for you.  If you have had past treatment for cervical cancer or a condition that could lead to cancer, you need Pap tests and screening for cancer for at least 20 years after your treatment. If Pap tests have been discontinued, your risk factors (such as having a new sexual partner) need to be reassessed to determine if screening should resume. Some women have medical problems that increase the chance of getting cervical cancer. In these cases, your health care provider Giorgio recommend more frequent screening and Pap tests. Colorectal Cancer  This type of cancer can be detected and often prevented.  Routine colorectal cancer screening usually begins at 52 years of age and continues through 52 years of age.  Your health care provider Allensworth recommend screening at an earlier age if you have risk factors for colon cancer.  Your health care provider Thiem also recommend using home test kits to check for hidden blood in the stool.  A small camera at the end of a tube can be used to examine your colon directly (sigmoidoscopy or colonoscopy). This is  done to check for the earliest forms of colorectal cancer.  Routine screening usually begins at age 70.  Direct examination of the colon should be repeated every 5-10 years through 52 years of age. However, you Lanius need to be screened more often if early forms of precancerous polyps or small growths are found. Skin Cancer  Check your skin from head to toe regularly.  Tell your health care provider about any new moles or changes in moles, especially if there is a change in a mole's shape or color.  Also tell your health care provider if you have a  mole that is larger than the size of a pencil eraser.  Always use sunscreen. Apply sunscreen liberally and repeatedly throughout the day.  Protect yourself by wearing long sleeves, pants, a wide-brimmed hat, and sunglasses whenever you are outside. Heart disease, diabetes, and high blood pressure  High blood pressure causes heart disease and increases the risk of stroke. High blood pressure is more likely to develop in:  People who have blood pressure in the high end of the normal range (130-139/85-89 mm Hg).  People who are overweight or obese.  People who are African American.  If you are 40-62 years of age, have your blood pressure checked every 3-5 years. If you are 13 years of age or older, have your blood pressure checked every year. You should have your blood pressure measured twice-once when you are at a hospital or clinic, and once when you are not at a hospital or clinic. Record the average of the two measurements. To check your blood pressure when you are not at a hospital or clinic, you can use:  An automated blood pressure machine at a pharmacy.  A home blood pressure monitor.  If you are between 29 years and 95 years old, ask your health care provider if you should take aspirin to prevent strokes.  Have regular diabetes screenings. This involves taking a blood sample to check your fasting blood sugar level.  If you are at a  normal weight and have a low risk for diabetes, have this test once every three years after 52 years of age.  If you are overweight and have a high risk for diabetes, consider being tested at a younger age or more often. Preventing infection Hepatitis B  If you have a higher risk for hepatitis B, you should be screened for this virus. You are considered at high risk for hepatitis B if:  You were born in a country where hepatitis B is common. Ask your health care provider which countries are considered high risk.  Your parents were born in a high-risk country, and you have not been immunized against hepatitis B (hepatitis B vaccine).  You have HIV or AIDS.  You use needles to inject street drugs.  You live with someone who has hepatitis B.  You have had sex with someone who has hepatitis B.  You get hemodialysis treatment.  You take certain medicines for conditions, including cancer, organ transplantation, and autoimmune conditions. Hepatitis C  Blood testing is recommended for:  Everyone born from [birth year] through 1966.  Anyone with known risk factors for hepatitis C. Sexually transmitted infections (STIs)  You should be screened for sexually transmitted infections (STIs) including gonorrhea and chlamydia if:  You are sexually active and are younger than 52 years of age.  You are older than 52 years of age and your health care provider tells you that you are at risk for this type of infection.  Your sexual activity has changed since you were last screened and you are at an increased risk for chlamydia or gonorrhea. Ask your health care provider if you are at risk.  If you do not have HIV, but are at risk, it Fearnow be recommended that you take a prescription medicine daily to prevent HIV infection. This is called pre-exposure prophylaxis (PrEP). You are considered at risk if:  You are sexually active and do not regularly use condoms or know the HIV status of your partner(s).  You  take drugs by injection.  You are sexually active  with a partner who has HIV. Talk with your health care provider about whether you are at high risk of being infected with HIV. If you choose to begin PrEP, you should first be tested for HIV. You should then be tested every 3 months for as long as you are taking PrEP. Pregnancy  If you are premenopausal and you Romanoff become pregnant, ask your health care provider about preconception counseling.  If you Kinnear become pregnant, take 400 to 800 micrograms (mcg) of folic acid every day.  If you want to prevent pregnancy, talk to your health care provider about birth control (contraception). Osteoporosis and menopause  Osteoporosis is a disease in which the bones lose minerals and strength with aging. This can result in serious bone fractures. Your risk for osteoporosis can be identified using a bone density scan.  If you are 50 years of age or older, or if you are at risk for osteoporosis and fractures, ask your health care provider if you should be screened.  Ask your health care provider whether you should take a calcium or vitamin D supplement to lower your risk for osteoporosis.  Menopause Gaede have certain physical symptoms and risks.  Hormone replacement therapy Kulig reduce some of these symptoms and risks. Talk to your health care provider about whether hormone replacement therapy is right for you. Follow these instructions at home:  Schedule regular health, dental, and eye exams.  Stay current with your immunizations.  Do not use any tobacco products including cigarettes, chewing tobacco, or electronic cigarettes.  If you are pregnant, do not drink alcohol.  If you are breastfeeding, limit how much and how often you drink alcohol.  Limit alcohol intake to no more than 1 drink per day for nonpregnant women. One drink equals 12 ounces of beer, 5 ounces of wine, or 1 ounces of hard liquor.  Do not use street drugs.  Do not share  needles.  Ask your health care provider for help if you need support or information about quitting drugs.  Tell your health care provider if you often feel depressed.  Tell your health care provider if you have ever been abused or do not feel safe at home. This information is not intended to replace advice given to you by your health care provider. Make sure you discuss any questions you have with your health care provider. Document Released: 09/21/2010 Document Revised: 08/14/2015 Document Reviewed: 12/10/2014 Elsevier Interactive Patient Education  2017 [employer].

## 2016-05-20 NOTE — Assessment & Plan Note (Signed)
Urged patient to quit smoking; she is not ready right now; I am here to help if/when ready

## 2016-05-21 LAB — VITAMIN D 25 HYDROXY (VIT D DEFICIENCY, FRACTURES): Vit D, 25-Hydroxy: 27 ng/mL — ABNORMAL LOW (ref 30–100)

## 2016-05-21 LAB — HIV ANTIBODY (ROUTINE TESTING W REFLEX): HIV 1&2 Ab, 4th Generation: NONREACTIVE

## 2016-06-24 ENCOUNTER — Encounter: Payer: Self-pay | Admitting: Family Medicine

## 2016-06-24 ENCOUNTER — Ambulatory Visit (INDEPENDENT_AMBULATORY_CARE_PROVIDER_SITE_OTHER): Payer: BLUE CROSS/BLUE SHIELD | Admitting: Family Medicine

## 2016-06-24 VITALS — BP 128/78 | HR 98 | Temp 98.7°F | Resp 16 | Ht 67.0 in | Wt 220.0 lb

## 2016-06-24 DIAGNOSIS — R399 Unspecified symptoms and signs involving the genitourinary system: Secondary | ICD-10-CM | POA: Diagnosis not present

## 2016-06-24 LAB — POCT URINALYSIS DIPSTICK
Bilirubin, UA: NEGATIVE
Blood, UA: NEGATIVE
Glucose, UA: NEGATIVE
Ketones, UA: NEGATIVE
Leukocytes, UA: NEGATIVE
Nitrite, UA: NEGATIVE
Spec Grav, UA: 1.02 (ref 1.030–1.035)
Urobilinogen, UA: 0.2 (ref ?–2.0)
pH, UA: 5 (ref 5.0–8.0)

## 2016-06-24 MED ORDER — NITROFURANTOIN MONOHYD MACRO 100 MG PO CAPS: 100.0000 mg | ORAL_CAPSULE | Freq: Two times a day (BID) | ORAL | 0 refills | Status: AC

## 2016-06-24 NOTE — Progress Notes (Signed)
Name: Susan Snyder   MRN: [id number]    DOB: 1965-05-21   Date:06/24/2016       Progress Note  Subjective  Chief Complaint  Chief Complaint  Patient presents with  . Acute Visit    possible kidney infection     Urinary Tract Infection   This is a new problem. The current episode started in the past 7 days (3 days ago). The quality of the pain is described as aching (dull ache is the lower back and sharp pain in the lower abdomen). The pain is moderate. There has been no fever. She is not sexually active. There is a history of pyelonephritis. Associated symptoms include flank pain. Pertinent negatives include no chills, discharge, hematuria or urgency. Treatments tried: Azo. The treatment provided mild relief. Her past medical history is significant for recurrent UTIs.      Past Medical History:  Diagnosis Date  . Chronic diarrhea   . Chronic gastritis Sept. 2015   on EGD   . Collagenous colitis    colonoscopy biopsies 11/01/13  . Hyperplastic polyps of stomach    Aug 2015  . Hypertension   . Neoplasm of uncertain behavior of skin 05/20/2016   Hx of skin cancer, BCC  . Renal cyst     Past Surgical History:  Procedure Laterality Date  . ABDOMINAL HYSTERECTOMY  2005   partial, still has ovaries, due to fibroids.  Marland Kitchen [place]   x 4  . COLONOSCOPY W/ BIOPSIES  11/01/13   collagenous colitis, sigmoid polypectomy x 2; hyperplastic polyps  . ESOPHAGOGASTRODUODENOSCOPY  11/22/13   chronic gastritis  . HIP ARTHROPLASTY Bilateral 01/15/14,03/05/14   total hip, Dr. Nilsa Nutting  . NECK SURGERY  2015    Family History  Problem Relation Age of Onset  . Stroke Mother   . Glaucoma Mother   . Heart disease Father   . Cancer Father     prostate  . Heart disease Sister   . Diabetes Sister   . Diabetes Paternal Aunt   . Heart disease Paternal Aunt   . Bleeding Disorder Paternal Uncle   . Diabetes Paternal Aunt   . Heart disease Paternal Aunt     Social History    Social History  . Marital status: Widowed    Spouse name: N/A  . Number of children: N/A  . Years of education: N/A   Occupational History  . Not on file.   Social History Main Topics  . Smoking status: Current Every Day Smoker    Packs/day: 0.50    Types: Cigarettes  . Smokeless tobacco: Never Used  . Alcohol use No  . Drug use: No  . Sexual activity: No   Other Topics Concern  . Not on file   Social History Narrative  . No narrative on file     Current Outpatient Prescriptions:  .  buPROPion (WELLBUTRIN XL) 150 MG 24 hr tablet, Take 1 tablet (150 mg total) by mouth daily., Disp: 90 tablet, Rfl: 3 .  loperamide (IMODIUM) 2 MG capsule, Take 2 mg by mouth 2 (two) times daily., Disp: , Rfl:  .  metoprolol succinate (TOPROL-XL) 25 MG 24 hr tablet, Take 1 tablet (25 mg total) by mouth daily., Disp: 90 tablet, Rfl: 3 .  triamcinolone cream (KENALOG) 0.1 %, Apply 1 application topically 2 (two) times daily. As needed to rash, Disp: 453.6 g, Rfl: 1  Allergies  Allergen Reactions  . Adhesive [Tape]   .  Gabapentin Nausea Only    Higher Dose causes vomiting Higher Dose causes vomiting  . Keflex [Cephalexin]   . Penicillins   . Sulfa Antibiotics      Review of Systems  Constitutional: Negative for chills.  Genitourinary: Positive for flank pain. Negative for hematuria and urgency.     Objective  Vitals:   06/24/16 1003  BP: 128/78  Pulse: 98  Resp: 16  Temp: 98.7 F (37.1 C)  TempSrc: Oral  SpO2: 97%  Weight: 220 lb (99.8 kg)  Height: 5' 7"  (1.702 m)    Physical Exam  Constitutional: She is well-developed, well-nourished, and in no distress.  Cardiovascular: Normal rate, regular rhythm and normal heart sounds.   No murmur heard. Pulmonary/Chest: Effort normal and breath sounds normal. She has no wheezes.  Abdominal: Soft. Bowel sounds are normal. There is tenderness in the suprapubic area. There is CVA tenderness.  Psychiatric: Mood, memory, affect and  judgment normal.  Nursing note and vitals reviewed.       Assessment & Plan  1. Urinary tract infection symptoms Based on symptoms and presentation, however urinalysis is normal. Start on Macrobid and sent for urine microscopic exam and urinalysis at [facility] lab. - POCT Urinalysis Dipstick - Urine Culture - Urinalysis, Routine w reflex microscopic - nitrofurantoin, macrocrystal-monohydrate, (MACROBID) 100 MG capsule; Take 1 capsule (100 mg total) by mouth 2 (two) times daily.  Dispense: 14 capsule; Refill: 0   [name] Asad [facility] 06/24/2016 10:18 AM

## 2016-06-25 LAB — URINALYSIS, ROUTINE W REFLEX MICROSCOPIC
Bilirubin Urine: NEGATIVE
Glucose, UA: NEGATIVE
Hgb urine dipstick: NEGATIVE
Ketones, ur: NEGATIVE
Leukocytes, UA: NEGATIVE
Nitrite: NEGATIVE
Protein, ur: NEGATIVE
Specific Gravity, Urine: 1.022 (ref 1.001–1.035)
pH: 5.5 (ref 5.0–8.0)

## 2016-06-25 LAB — URINE CULTURE: Organism ID, Bacteria: NO GROWTH

## 2017-04-22 ENCOUNTER — Other Ambulatory Visit: Payer: Self-pay | Admitting: Family Medicine

## 2017-04-22 NOTE — Telephone Encounter (Signed)
Seen within the last year appt scheduled already for March Rx approved

## 2017-04-26 ENCOUNTER — Other Ambulatory Visit: Payer: Self-pay

## 2017-04-26 NOTE — Telephone Encounter (Signed)
ERROR

## 2017-05-26 ENCOUNTER — Encounter: Payer: BLUE CROSS/BLUE SHIELD | Admitting: Family Medicine

## 2017-07-14 ENCOUNTER — Other Ambulatory Visit: Payer: Self-pay | Admitting: Family Medicine

## 2017-07-18 ENCOUNTER — Other Ambulatory Visit: Payer: Self-pay | Admitting: Family Medicine

## 2017-07-21 ENCOUNTER — Other Ambulatory Visit: Payer: Self-pay

## 2017-07-22 MED ORDER — METOPROLOL SUCCINATE ER 25 MG PO TB24: 25.0000 mg | ORAL_TABLET | Freq: Every day | ORAL | 0 refills | Status: DC

## 2017-07-28 ENCOUNTER — Other Ambulatory Visit: Payer: Self-pay | Admitting: Family Medicine

## 2017-07-28 MED ORDER — BUPROPION HCL ER (XL) 150 MG PO TB24: 150.0000 mg | ORAL_TABLET | Freq: Every day | ORAL | 0 refills | Status: DC

## 2017-07-28 NOTE — Telephone Encounter (Signed)
Last appt 2018; patient no showed March 2019, but she does have an appt in June; one month approved

## 2017-07-28 NOTE — Telephone Encounter (Signed)
Copied from [place] [id number]. Topic: Quick Communication - Rx Refill/Question >> Broker 9, 2019  2:10 PM Carolyn Stare wrote: Medication   buPROPion (WELLBUTRIN XL) 150 MG 24 hr tablet         Pt could not get an appt till June for med fup . She will need a refill of the below meds until her appt .                metoprolol succinate (TOPROL-XL) 25 MG 24 hr tablet    Has the patient contacted their pharmacy yes    Preferred Pharmacy  [facility]  Agent: Please be advised that RX refills Youse take up to 3 business days. We ask that you follow-up with your pharmacy.

## 2017-08-25 ENCOUNTER — Encounter: Payer: Self-pay | Admitting: Family Medicine

## 2017-08-25 ENCOUNTER — Ambulatory Visit (INDEPENDENT_AMBULATORY_CARE_PROVIDER_SITE_OTHER): Payer: BLUE CROSS/BLUE SHIELD | Admitting: Family Medicine

## 2017-08-25 VITALS — BP 124/82 | HR 86 | Temp 98.5°F | Resp 14 | Ht 67.0 in | Wt 223.9 lb

## 2017-08-25 DIAGNOSIS — Z1239 Encounter for other screening for malignant neoplasm of breast: Secondary | ICD-10-CM

## 2017-08-25 DIAGNOSIS — Z1231 Encounter for screening mammogram for malignant neoplasm of breast: Secondary | ICD-10-CM | POA: Diagnosis not present

## 2017-08-25 DIAGNOSIS — Z720 Tobacco use: Secondary | ICD-10-CM

## 2017-08-25 DIAGNOSIS — K529 Noninfective gastroenteritis and colitis, unspecified: Secondary | ICD-10-CM

## 2017-08-25 DIAGNOSIS — K52831 Collagenous colitis: Secondary | ICD-10-CM | POA: Diagnosis not present

## 2017-08-25 DIAGNOSIS — Z862 Personal history of diseases of the blood and blood-forming organs and certain disorders involving the immune mechanism: Secondary | ICD-10-CM | POA: Diagnosis not present

## 2017-08-25 DIAGNOSIS — I10 Essential (primary) hypertension: Secondary | ICD-10-CM | POA: Diagnosis not present

## 2017-08-25 DIAGNOSIS — F430 Acute stress reaction: Secondary | ICD-10-CM | POA: Diagnosis not present

## 2017-08-25 DIAGNOSIS — Z6835 Body mass index (BMI) 35.0-35.9, adult: Secondary | ICD-10-CM | POA: Diagnosis not present

## 2017-08-25 DIAGNOSIS — Z23 Encounter for immunization: Secondary | ICD-10-CM

## 2017-08-25 DIAGNOSIS — R635 Abnormal weight gain: Secondary | ICD-10-CM

## 2017-08-25 DIAGNOSIS — E6609 Other obesity due to excess calories: Secondary | ICD-10-CM

## 2017-08-25 MED ORDER — BUPROPION HCL ER (XL) 300 MG PO TB24: 300.0000 mg | ORAL_TABLET | Freq: Every day | ORAL | 3 refills | Status: DC

## 2017-08-25 MED ORDER — METOPROLOL SUCCINATE ER 25 MG PO TB24: 25.0000 mg | ORAL_TABLET | Freq: Every day | ORAL | 3 refills | Status: DC

## 2017-08-25 NOTE — Progress Notes (Signed)
BP 124/82   Pulse 86   Temp 98.5 F (36.9 C) (Oral)   Resp 14   Ht 5' 7"  (1.702 m)   Wt 223 lb 14.4 oz (101.6 kg)   SpO2 98%   BMI 35.07 kg/m    Subjective:    [name], female    DOB: 24-Dec-1965, 52 y.o.   MRN: [id number]  HPI: Sammantha Mehlhaff Snyder is a 52 y.o. female  Chief Complaint  Patient presents with  . Follow-up  . Medication Refill    HPI Patient is here for follow-up She has chronic diarrhea, colitis; cannot afford the budesonide; she called Dr. Allen Norris; stool is mostly 7 on the [facility] scale, sometimes 6; varies color; salads tear her up; the healthier it is, the worse her stools are; spends first of the day in the bathroom; already had the colonoscopy, but due for someone to see her soon for repeat; would be interested in seeing another provider; stress can make it worse Mother had a stroke several years ago; inside she's an emotional wreck; macular degeneration, slowly going blind; still independent per se and lives with patient's dad a block away; no SI/HI Borderline cholesterol; runs in the family on the father's side Her older sister had COPD and ended up with a pacemaker and CHF at age 58 Vit D deficiency She has had neck and back surgeries; she gets symptoms in her jaw and face and can easily stretch or sneeze and gets a cramp; strange sensations with her lips and left shoulder across the front and back; she gets symptoms like her lips are numb but they are not, strange tingling sensation  Still smoking; stress relief; quit for the whole month of April; she got sick and had this awful cough; then things fell apart with her mother; lots of arguing; she is unkind to her father, hard to see that; some days 1/2 or 3/4 ppd, not as bad as it was; has desire to quit now Obesity; she has not used her fit bit in a year; no time for exercise; poor meal planning HTN; controlled with one pill; not big on salt  Depression screen [facility] 2/9 08/25/2017 06/24/2016 05/20/2016  04/20/2016 12/23/2014  Decreased Interest 0 0 0 0 0  Down, Depressed, Hopeless 1 0 0 0 1  PHQ - 2 Score 1 0 0 0 1  Altered sleeping 0 - - - -  Tired, decreased energy 0 - - - -  Change in appetite 0 - - - -  Feeling bad or failure about yourself  0 - - - -  Trouble concentrating 0 - - - -  Moving slowly or fidgety/restless 0 - - - -  Suicidal thoughts 0 - - - -  PHQ-9 Score 1 - - - -  Difficult doing work/chores Not difficult at all - - - -    Relevant past medical, surgical, family and social history reviewed Past Medical History:  Diagnosis Date  . Chronic diarrhea   . Chronic gastritis Sept. 2015   on EGD   . Collagenous colitis    colonoscopy biopsies 11/01/13  . Hyperplastic polyps of stomach    Aug 2015  . Hypertension   . Neoplasm of uncertain behavior of skin 05/20/2016   Hx of skin cancer, BCC  . Renal cyst    Past Surgical History:  Procedure Laterality Date  . ABDOMINAL HYSTERECTOMY  2005   partial, still has ovaries, due to fibroids.  Marland Kitchen BACK  SURGERY  1996, 1998, 1999   x 4  . COLONOSCOPY W/ BIOPSIES  11/01/13   collagenous colitis, sigmoid polypectomy x 2; hyperplastic polyps  . ESOPHAGOGASTRODUODENOSCOPY  11/22/13   chronic gastritis  . HIP ARTHROPLASTY Bilateral 01/15/14,03/05/14   total hip, Dr. Nilsa Nutting  . NECK SURGERY  2015   Family History  Problem Relation Age of Onset  . Stroke Mother   . Glaucoma Mother   . Heart disease Father   . Cancer Father        prostate  . Heart disease Sister   . Diabetes Sister   . Diabetes Paternal Aunt   . Heart disease Paternal Aunt   . Bleeding Disorder Paternal Uncle   . Heart disease Sister        Psychologist, forensic  . Diabetes Paternal Aunt   . Heart disease Paternal Aunt    Social History   Tobacco Use  . Smoking status: Current Every Day Smoker    Packs/day: 0.50    Types: Cigarettes  . Smokeless tobacco: Never Used  Substance Use Topics  . Alcohol use: No  . Drug use: No    Interim medical history  since last visit reviewed. Allergies and medications reviewed  Review of Systems Per HPI unless specifically indicated above     Objective:    BP 124/82   Pulse 86   Temp 98.5 F (36.9 C) (Oral)   Resp 14   Ht 5' 7"  (1.702 m)   Wt 223 lb 14.4 oz (101.6 kg)   SpO2 98%   BMI 35.07 kg/m   Wt Readings from Last 3 Encounters:  08/25/17 223 lb 14.4 oz (101.6 kg)  06/24/16 220 lb (99.8 kg)  05/20/16 218 lb 6 oz (99.1 kg)    Physical Exam  Constitutional: She appears well-developed and well-nourished. No distress.  obese  HENT:  Head: Normocephalic and atraumatic.  Eyes: EOM are normal. No scleral icterus.  Neck: No thyromegaly present.  Cardiovascular: Normal rate, regular rhythm and normal heart sounds.  No murmur heard. Pulmonary/Chest: Effort normal and breath sounds normal. No respiratory distress. She has no wheezes.  Abdominal: Soft. Bowel sounds are normal. She exhibits no distension.  Musculoskeletal: Normal range of motion. She exhibits no edema.  Neurological: She is alert. She exhibits normal muscle tone.  Skin: Skin is warm and dry. She is not diaphoretic. No pallor.  Psychiatric: She has a normal mood and affect. Her behavior is normal. Judgment and thought content normal.    Results for orders placed or performed in visit on 06/24/16  Urine Culture  Result Value Ref Range   Organism ID, Bacteria NO GROWTH   Urinalysis, Routine w reflex microscopic  Result Value Ref Range   Color, Urine DARK YELLOW YELLOW   APPearance CLEAR CLEAR   Specific Gravity, Urine 1.022 1.001 - 1.035   pH 5.5 5.0 - 8.0   Glucose, UA NEGATIVE NEGATIVE   Bilirubin Urine NEGATIVE NEGATIVE   Ketones, ur NEGATIVE NEGATIVE   Hgb urine dipstick NEGATIVE NEGATIVE   Protein, ur NEGATIVE NEGATIVE   Nitrite NEGATIVE NEGATIVE   Leukocytes, UA NEGATIVE NEGATIVE  POCT Urinalysis Dipstick  Result Value Ref Range   Color, UA     Clarity, UA     Glucose, UA NEG    Bilirubin, UA NEG     Ketones, UA NEG    Spec Grav, UA 1.020 1.030 - 1.035   Blood, UA NEG    pH, UA 5.0 5.0 -  8.0   Protein, UA TRACE    Urobilinogen, UA 0.2 Negative - 2.0   Nitrite, UA NEG    Leukocytes, UA Negative Negative      Assessment & Plan:   Problem List Items Addressed This Visit      Cardiovascular and Mediastinum   Hypertension (Chronic)    Continue medicine, refill provided      Relevant Medications   metoprolol succinate (TOPROL-XL) 25 MG 24 hr tablet     Digestive   Collagenous colitis (Chronic)   Relevant Orders   Ambulatory referral to Gastroenterology     Other   Tobacco abuse    Encouraged cessation; discussed other means of dealing with stress, changing habits      Stress reaction    Continue wellbutrin, but we'll increase the dose      Relevant Medications   buPROPion (WELLBUTRIN XL) 300 MG 24 hr tablet   Obesity    encouarged weight loss; drink more water; she declined referral to nutritionist      Relevant Orders   Lipid panel   TSH   Hx of iron deficiency anemia    Check CBC and make sure not anemic which can confound energy level      Relevant Orders   CBC with Differential/Platelet    Other Visit Diagnoses    Chronic diarrhea    -  Primary   Relevant Orders   Ambulatory referral to Gastroenterology   COMPLETE METABOLIC PANEL WITH GFR   Magnesium   Weight gain       encouragement given for weight control   Screening for breast cancer       Relevant Orders   MM Digital Screening   Need for 23-polyvalent pneumococcal polysaccharide vaccine       Relevant Orders   Pneumococcal polysaccharide vaccine 23-valent greater than or equal to 2yo subcutaneous/IM (Completed)      Follow up plan: Return in about 3 months (around 11/25/2017) for complete physical.  An after-visit summary was printed and given to the patient at check-out.  Please see the patient instructions which Violet contain other information and recommendations beyond what is mentioned  above in the assessment and plan.  Meds ordered this encounter  Medications  . buPROPion (WELLBUTRIN XL) 300 MG 24 hr tablet    Sig: Take 1 tablet (300 mg total) by mouth daily.    Dispense:  90 tablet    Refill:  3  . metoprolol succinate (TOPROL-XL) 25 MG 24 hr tablet    Sig: Take 1 tablet (25 mg total) by mouth daily.    Dispense:  90 tablet    Refill:  3    Needs follow up with Dr. Sanda Klein    Orders Placed This Encounter  Procedures  . MM Digital Screening  . Pneumococcal polysaccharide vaccine 23-valent greater than or equal to 2yo subcutaneous/IM  . CBC with Differential/Platelet  . COMPLETE METABOLIC PANEL WITH GFR  . Lipid panel  . TSH  . Magnesium  . Ambulatory referral to Gastroenterology

## 2017-08-25 NOTE — Assessment & Plan Note (Signed)
Encouraged cessation; discussed other means of dealing with stress, changing habits

## 2017-08-25 NOTE — Assessment & Plan Note (Signed)
Check CBC and make sure not anemic which can confound energy level

## 2017-08-25 NOTE — Patient Instructions (Addendum)
I do encourage you to quit smoking Call [phone] to sign up for smoking cessation classes You can call 1-800-QUIT-NOW to talk with a smoking cessation coach  Check out the information at familydoctor.org entitled "Nutrition for Weight Loss: What You Need to Know about Fad Diets" Try to lose between 1-2 pounds per week by taking in fewer calories and burning off more calories You can succeed by limiting portions, limiting foods dense in calories and fat, becoming more active, and drinking 8 glasses of water a day (64 ounces) Don't skip meals, especially breakfast, as skipping meals [place] alter your metabolism Do not use over-the-counter weight loss pills or gimmicks that claim rapid weight loss A healthy BMI (or body mass index) is between 18.5 and 24.9 You can calculate your ideal BMI at the [place] website ClubMonet[employer].fr  Increase the wellbutrin (bupropion) to 300 mg  Call me in 3 weeks with an update   Coping with Quitting Smoking Quitting smoking is a physical and mental challenge. You will face cravings, withdrawal symptoms, and temptation. Before quitting, work with your health care provider to make a plan that can help you cope. Preparation can help you quit and keep you from giving in. How can I cope with cravings? Cravings usually last for 5-10 minutes. If you get through it, the craving will pass. Consider taking the following actions to help you cope with cravings:  Keep your mouth busy: ? Chew sugar-free gum. ? Suck on hard candies or a straw. ? Brush your teeth.  Keep your hands and body busy: ? Immediately change to a different activity when you feel a craving. ? Squeeze or play with a ball. ? Do an activity or a hobby, like making bead jewelry, practicing needlepoint, or working with wood. ? Mix up your normal routine. ? Take a short exercise break. Go for a quick walk or run up and down stairs. ? Spend time in public  places where smoking is not allowed.  Focus on doing something kind or helpful for someone else.  Call a friend or family member to talk during a craving.  Join a support group.  Call a quit line, such as 1-800-QUIT-NOW.  Talk with your health care provider about medicines that might help you cope with cravings and make quitting easier for you.  How can I deal with withdrawal symptoms? Your body [place] experience negative effects as it tries to get used to not having nicotine in the system. These effects are called withdrawal symptoms. They [place] include:  Feeling hungrier than normal.  Trouble concentrating.  Irritability.  Trouble sleeping.  Feeling depressed.  Restlessness and agitation.  Craving a cigarette.  To manage withdrawal symptoms:  Avoid places, people, and activities that trigger your cravings.  Remember why you want to quit.  Get plenty of sleep.  Avoid coffee and other caffeinated drinks. These [place] worsen some of your symptoms.  How can I handle social situations? Social situations can be difficult when you are quitting smoking, especially in the first few weeks. To manage this, you can:  Avoid parties, bars, and other social situations where people might be smoking.  Avoid alcohol.  Leave right away if you have the urge to smoke.  Explain to your family and friends that you are quitting smoking. Ask for understanding and support.  Plan activities with friends or family where smoking is not an option.  What are some ways I can cope with stress? Wanting to smoke [place] cause stress, and stress  can make you want to smoke. Find ways to manage your stress. Relaxation techniques can help. For example:  Breathe slowly and deeply, in through your nose and out through your mouth.  Listen to soothing, relaxing music.  Talk with a family member or friend about your stress.  Light a candle.  Soak in a bath or take a shower.  Think about a peaceful  place.  What are some ways I can prevent weight gain? Be aware that many people gain weight after they quit smoking. However, not everyone does. To keep from gaining weight, have a plan in place before you quit and stick to the plan after you quit. Your plan should include:  Having healthy snacks. When you have a craving, it [place] help to: ? Eat plain popcorn, crunchy carrots, celery, or other cut vegetables. ? Chew sugar-free gum.  Changing how you eat: ? Eat small portion sizes at meals. ? Eat 4-6 small meals throughout the day instead of 1-2 large meals a day. ? Be mindful when you eat. Do not watch television or do other things that might distract you as you eat.  Exercising regularly: ? Make time to exercise each day. If you do not have time for a long workout, do short bouts of exercise for 5-10 minutes several times a day. ? Do some form of strengthening exercise, like weight lifting, and some form of aerobic exercise, like running or swimming.  Drinking plenty of water or other low-calorie or no-calorie drinks. Drink 6-8 glasses of water daily, or as much as instructed by your health care provider.  Summary  Quitting smoking is a physical and mental challenge. You will face cravings, withdrawal symptoms, and temptation to smoke again. Preparation can help you as you go through these challenges.  You can cope with cravings by keeping your mouth busy (such as by chewing gum), keeping your body and hands busy, and making calls to family, friends, or a helpline for people who want to quit smoking.  You can cope with withdrawal symptoms by avoiding places where people smoke, avoiding drinks with caffeine, and getting plenty of rest.  Ask your health care provider about the different ways to prevent weight gain, avoid stress, and handle social situations. This information is not intended to replace advice given to you by your health care provider. Make sure you discuss any questions you  have with your health care provider. Document Released: 03/05/2016 Document Revised: 03/05/2016 Document Reviewed: 03/05/2016 Elsevier Interactive Patient Education  [employer].

## 2017-08-25 NOTE — Assessment & Plan Note (Signed)
Continue wellbutrin, but we'll increase the dose

## 2017-08-25 NOTE — Assessment & Plan Note (Signed)
encouarged weight loss; drink more water; she declined referral to nutritionist

## 2017-08-25 NOTE — Assessment & Plan Note (Signed)
Continue medicine, refill provided

## 2017-08-26 ENCOUNTER — Encounter: Payer: Self-pay | Admitting: Gastroenterology

## 2017-08-26 ENCOUNTER — Other Ambulatory Visit: Payer: Self-pay | Admitting: Family Medicine

## 2017-08-26 DIAGNOSIS — E782 Mixed hyperlipidemia: Secondary | ICD-10-CM

## 2017-08-26 LAB — COMPLETE METABOLIC PANEL WITH GFR
AG Ratio: 1.8 (calc) (ref 1.0–2.5)
ALT: 11 U/L (ref 6–29)
AST: 13 U/L (ref 10–35)
Albumin: 4.4 g/dL (ref 3.6–5.1)
Alkaline phosphatase (APISO): 92 U/L (ref 33–130)
BUN: 17 mg/dL (ref 7–25)
CO2: 24 mmol/L (ref 20–32)
Calcium: 9.6 mg/dL (ref 8.6–10.4)
Chloride: 106 mmol/L (ref 98–110)
Creat: 0.89 mg/dL (ref 0.50–1.05)
GFR, Est African American: 86 mL/min/{1.73_m2} (ref >=60)
GFR, Est Non African American: 74 mL/min/{1.73_m2} (ref >=60)
Globulin: 2.4 g/dL (calc) (ref 1.9–3.7)
Glucose, Bld: 78 mg/dL (ref 65–99)
Potassium: 4.3 mmol/L (ref 3.5–5.3)
Sodium: 139 mmol/L (ref 135–146)
Total Bilirubin: 0.2 mg/dL (ref 0.2–1.2)
Total Protein: 6.8 g/dL (ref 6.1–8.1)

## 2017-08-26 LAB — CBC WITH DIFFERENTIAL/PLATELET
Basophils Absolute: 36 cells/uL (ref 0–200)
Basophils Relative: 0.3 %
Eosinophils Absolute: 504 cells/uL — ABNORMAL HIGH (ref 15–500)
Eosinophils Relative: 4.2 %
HCT: 41.7 % (ref 35.0–45.0)
Hemoglobin: 13.7 g/dL (ref 11.7–15.5)
Lymphs Abs: 4008 cells/uL — ABNORMAL HIGH (ref 850–3900)
MCH: 28.8 pg (ref 27.0–33.0)
MCHC: 32.9 g/dL (ref 32.0–36.0)
MCV: 87.6 fL (ref 80.0–100.0)
MPV: 11.8 fL (ref 7.5–12.5)
Monocytes Relative: 3.9 %
Neutro Abs: 6984 cells/uL (ref 1500–7800)
Neutrophils Relative %: 58.2 %
Platelets: 170 10*3/uL (ref 140–400)
RBC: 4.76 10*6/uL (ref 3.80–5.10)
RDW: 13 % (ref 11.0–15.0)
Total Lymphocyte: 33.4 %
WBC mixed population: 468 cells/uL (ref 200–950)
WBC: 12 10*3/uL — ABNORMAL HIGH (ref 3.8–10.8)

## 2017-08-26 LAB — LIPID PANEL
Cholesterol: 218 mg/dL — ABNORMAL HIGH (ref <200)
HDL: 35 mg/dL — ABNORMAL LOW (ref >50)
LDL Cholesterol (Calc): 156 mg/dL (calc) — ABNORMAL HIGH
Non-HDL Cholesterol (Calc): 183 mg/dL (calc) — ABNORMAL HIGH (ref <130)
Total CHOL/HDL Ratio: 6.2 (calc) — ABNORMAL HIGH (ref <5.0)
Triglycerides: 148 mg/dL (ref <150)

## 2017-08-26 LAB — TSH: TSH: 1.03 mIU/L

## 2017-08-26 LAB — MAGNESIUM: Magnesium: 2 mg/dL (ref 1.5–2.5)

## 2017-08-26 MED ORDER — ATORVASTATIN CALCIUM 10 MG PO TABS: 10.0000 mg | ORAL_TABLET | Freq: Every day | ORAL | 1 refills | Status: DC

## 2017-08-26 NOTE — Progress Notes (Signed)
Start statin; recheck lipids on or after July 22nd; ordered

## 2017-09-20 ENCOUNTER — Other Ambulatory Visit: Payer: Self-pay | Admitting: Family Medicine

## 2017-09-21 NOTE — Telephone Encounter (Signed)
I approved 30 days plus one refill (two month supply) on 08/26/17 Too soon for refill Please resolve with pharmacy

## 2017-10-22 ENCOUNTER — Other Ambulatory Visit: Payer: Self-pay | Admitting: Family Medicine

## 2017-10-22 NOTE — Telephone Encounter (Signed)
Please remind patient that she was due for labs 6-8 weeks after starting the medicine (a week or two before running out of the second bottle) Please ask her to come in soon for those labs so we know if this dose of atorvastatin is going to work for her I'll approve five more days of medicine Thank you

## 2017-10-27 ENCOUNTER — Other Ambulatory Visit: Payer: Self-pay

## 2017-10-27 DIAGNOSIS — E782 Mixed hyperlipidemia: Secondary | ICD-10-CM

## 2017-10-27 LAB — LIPID PANEL
Cholesterol: 125 mg/dL (ref <200)
HDL: 33 mg/dL — ABNORMAL LOW (ref >50)
LDL Cholesterol (Calc): 73 mg/dL (calc)
Non-HDL Cholesterol (Calc): 92 mg/dL (calc) (ref <130)
Total CHOL/HDL Ratio: 3.8 (calc) (ref <5.0)
Triglycerides: 104 mg/dL (ref <150)

## 2017-10-28 ENCOUNTER — Other Ambulatory Visit: Payer: Self-pay | Admitting: Family Medicine

## 2017-10-28 MED ORDER — ATORVASTATIN CALCIUM 10 MG PO TABS: 10.0000 mg | ORAL_TABLET | Freq: Every day | ORAL | 11 refills | Status: DC

## 2017-10-28 NOTE — Progress Notes (Signed)
Statin approved for one year

## 2018-04-13 ENCOUNTER — Ambulatory Visit (INDEPENDENT_AMBULATORY_CARE_PROVIDER_SITE_OTHER): Payer: BLUE CROSS/BLUE SHIELD | Admitting: Family Medicine

## 2018-04-13 ENCOUNTER — Encounter: Payer: Self-pay | Admitting: Family Medicine

## 2018-04-13 VITALS — BP 116/82 | HR 79 | Temp 98.2°F | Resp 12 | Ht 65.0 in | Wt 221.4 lb

## 2018-04-13 DIAGNOSIS — E6609 Other obesity due to excess calories: Secondary | ICD-10-CM | POA: Diagnosis not present

## 2018-04-13 DIAGNOSIS — Z720 Tobacco use: Secondary | ICD-10-CM

## 2018-04-13 DIAGNOSIS — Z1211 Encounter for screening for malignant neoplasm of colon: Secondary | ICD-10-CM | POA: Diagnosis not present

## 2018-04-13 DIAGNOSIS — Z0000 Encounter for general adult medical examination without abnormal findings: Secondary | ICD-10-CM | POA: Diagnosis not present

## 2018-04-13 DIAGNOSIS — Z114 Encounter for screening for human immunodeficiency virus [HIV]: Secondary | ICD-10-CM

## 2018-04-13 DIAGNOSIS — Z6836 Body mass index (BMI) 36.0-36.9, adult: Secondary | ICD-10-CM

## 2018-04-13 DIAGNOSIS — E559 Vitamin D deficiency, unspecified: Secondary | ICD-10-CM

## 2018-04-13 DIAGNOSIS — D485 Neoplasm of uncertain behavior of skin: Secondary | ICD-10-CM

## 2018-04-13 MED ORDER — ASPIRIN EC 81 MG PO TBEC: 81.0000 mg | DELAYED_RELEASE_TABLET | Freq: Every day | ORAL | Status: AC

## 2018-04-13 NOTE — Progress Notes (Signed)
BP 116/82   Pulse 79   Temp 98.2 F (36.8 C)   Resp 12   Ht _0  (1.651 m)   Wt 221 lb 6.4 oz (100.4 kg)   SpO2 99%   BMI 36.84 kg/m    Subjective:    [name], female    DOB: [birth year], 53 y.o.   MRN: [id number]  HPI: Susan Snyder is a 53 y.o. female  Chief Complaint  Patient presents with  . Annual Exam    HPI Patient is here for physical USPSTF grade A and B recommendations Depression:  Depression screen [facility] 2/9 04/13/2018 08/25/2017 06/24/2016 05/20/2016 04/20/2016  Decreased Interest 0 0 0 0 0  Down, Depressed, Hopeless 0 1 0 0 0  PHQ - 2 Score 0 1 0 0 0  Altered sleeping 0 0 - - -  Tired, decreased energy 0 0 - - -  Change in appetite 0 0 - - -  Feeling bad or failure about yourself  0 0 - - -  Trouble concentrating 0 0 - - -  Moving slowly or fidgety/restless 0 0 - - -  Suicidal thoughts 0 0 - - -  PHQ-9 Score 0 1 - - -  Difficult doing work/chores Not difficult at all Not difficult at all - - -   Hypertension:  BP Readings from Last 3 Encounters:  04/13/18 116/82  08/25/17 124/82  06/24/16 128/78   Obesity: down 2.5 pounds over the holidays; she would like to weigh 175 pounds, more reasonable  Wt Readings from Last 3 Encounters:  04/13/18 221 lb 6.4 oz (100.4 kg)  08/25/17 223 lb 14.4 oz (101.6 kg)  06/24/16 220 lb (99.8 kg)   BMI Readings from Last 3 Encounters:  04/13/18 36.84 kg/m  08/25/17 35.07 kg/m  06/24/16 34.46 kg/m    Skin cancer: worrisome moles, spots on her upper chest; bad one on her back per daughter, she can't see it; hx of skin cancer, fair complected; no local derm Lung cancer:  Smoking, 1/2 ppd; first cig at age 41 years; quit for 6 years; picked up when became widow; part of her wants to quit, but tough Breast cancer: ordered mammo June 2019; patient will schedule; no lumps Colorectal cancer: UTD; staff put in referral due later this year Cervical cancer screening: s/p hysterectomy BRCA gene screening: family hx  of breast and/or ovarian cancer and/or metastatic prostate cancer? no HIV, hep B, hep C: recommended HIV STD testing and prevention (chl/gon/syphilis): no issues Intimate partner violence: no abuse Contraception: s/p hysterectomy Osteoporosis: n/a Fall prevention/vitamin D: discussed; trying to enough calcium, hx of low vit D Immunizations: declined flu; shingrix offered by CMA; tetanus UTD Diet: poor eating habits; she knows what she needs to do; will work on this Exercise: doesn't exercise like she should; I asked about fitting in five minutes of mild exercise; she'll try to destress Alcohol:    Office Visit from 04/13/2018 in [facility]  AUDIT-C Score  1     Tobacco use: current smoker, encouraged to quit AAA: n/a Aspirin: not taking aspirin; recommended by doctor The ASCVD Risk score Mikey Bussing DC Jr., et al., 2013) failed to calculate for the following reasons:   The valid total cholesterol range is 130 to 320 mg/dL  Glucose:  Glucose  Date Value Ref Range Status  01/14/2015 79 65 - 99 mg/dL Final  01/09/2014 104 (H) 65 - 99 mg/dL Final   Glucose, Bld  Date  Value Ref Range Status  08/25/2017 78 65 - 99 mg/dL Final    Comment:    .            Fasting reference interval .   05/20/2016 91 65 - 99 mg/dL Final   Lipids:  Lab Results  Component Value Date   CHOL 125 10/27/2017   CHOL 218 (H) 08/25/2017   CHOL 190 05/20/2016   Lab Results  Component Value Date   HDL 33 (L) 10/27/2017   HDL 35 (L) 08/25/2017   HDL 35 (L) 05/20/2016   Lab Results  Component Value Date   LDLCALC 73 10/27/2017   LDLCALC 156 (H) 08/25/2017   LDLCALC 131 (H) 05/20/2016   Lab Results  Component Value Date   TRIG 104 10/27/2017   TRIG 148 08/25/2017   TRIG 120 05/20/2016   Lab Results  Component Value Date   CHOLHDL 3.8 10/27/2017   CHOLHDL 6.2 (H) 08/25/2017   CHOLHDL 5.4 (H) 05/20/2016   No results found for: LDLDIRECT   Depression screen [facility] 2/9 04/13/2018  08/25/2017 06/24/2016 05/20/2016 04/20/2016  Decreased Interest 0 0 0 0 0  Down, Depressed, Hopeless 0 1 0 0 0  PHQ - 2 Score 0 1 0 0 0  Altered sleeping 0 0 - - -  Tired, decreased energy 0 0 - - -  Change in appetite 0 0 - - -  Feeling bad or failure about yourself  0 0 - - -  Trouble concentrating 0 0 - - -  Moving slowly or fidgety/restless 0 0 - - -  Suicidal thoughts 0 0 - - -  PHQ-9 Score 0 1 - - -  Difficult doing work/chores Not difficult at all Not difficult at all - - -   Fall Risk  04/13/2018 08/25/2017 06/24/2016 05/20/2016 04/20/2016  Falls in the past year? 1 No No No No  Number falls in past yr: 0 - - - -  Injury with Fall? 0 - - - -    Relevant past medical, surgical, family and social history reviewed Past Medical History:  Diagnosis Date  . Chronic diarrhea   . Chronic gastritis Sept. 2015   on EGD   . Collagenous colitis    colonoscopy biopsies 11/01/13  . Hyperplastic polyps of stomach    Aug 2015  . Hypertension   . Neoplasm of uncertain behavior of skin 05/20/2016   Hx of skin cancer, BCC  . Renal cyst    Past Surgical History:  Procedure Laterality Date  . ABDOMINAL HYSTERECTOMY  2005   partial, still has ovaries, due to fibroids.  Marland [facility] [place]   x 4  . COLONOSCOPY W/ BIOPSIES  11/01/13   collagenous colitis, sigmoid polypectomy x 2; hyperplastic polyps  . ESOPHAGOGASTRODUODENOSCOPY  11/22/13   chronic gastritis  . HIP ARTHROPLASTY Bilateral 01/15/14,03/05/14   total hip, Dr. Nilsa Nutting  . NECK SURGERY  2015   Family History  Problem Relation Age of Onset  . Stroke Mother   . Glaucoma Mother   . Heart disease Father   . Cancer Father        prostate  . Heart disease Sister   . Diabetes Sister   . Diabetes Paternal Aunt   . Heart disease Paternal Aunt   . Bleeding Disorder Paternal Uncle   . Heart disease Sister        Psychologist, forensic  . Diabetes Paternal Aunt   . Heart disease Paternal Aunt  Social History   Tobacco Use  .  Smoking status: Current Every Day Smoker    Packs/day: 0.50    Types: Cigarettes  . Smokeless tobacco: Never Used  Substance Use Topics  . Alcohol use: No  . Drug use: No     Office Visit from 04/13/2018 in [facility]  AUDIT-C Score  1      Interim medical history since last visit reviewed. Allergies and medications reviewed  Review of Systems  Constitutional: Negative for fever.  HENT: Negative for nosebleeds.   Eyes: Negative for visual disturbance.  Respiratory: Negative for wheezing.   Cardiovascular: Negative for chest pain.  Gastrointestinal: Negative for blood in stool.  Endocrine: Negative for polydipsia.  Genitourinary: Negative for hematuria.  Skin:       Moles on chest and back  Neurological: Negative for tremors.  Hematological: Negative for adenopathy. Bruises/bleeds easily (normal for her).  Psychiatric/Behavioral: Negative for dysphoric mood.   Per HPI unless specifically indicated above     Objective:    BP 116/82   Pulse 79   Temp 98.2 F (36.8 C)   Resp 12   Ht _0  (1.651 m)   Wt 221 lb 6.4 oz (100.4 kg)   SpO2 99%   BMI 36.84 kg/m   Wt Readings from Last 3 Encounters:  04/13/18 221 lb 6.4 oz (100.4 kg)  08/25/17 223 lb 14.4 oz (101.6 kg)  06/24/16 220 lb (99.8 kg)    Physical Exam Constitutional:      Appearance: Normal appearance. She is well-developed.  HENT:     Head: Normocephalic and atraumatic.     Right Ear: Hearing, tympanic membrane, ear canal and external ear normal.     Left Ear: Hearing, tympanic membrane, ear canal and external ear normal.  Eyes:     General: No scleral icterus.       Right eye: No hordeolum.        Left eye: No hordeolum.     Conjunctiva/sclera: Conjunctivae normal.  Neck:     Thyroid: No thyromegaly.     Vascular: No carotid bruit.  Cardiovascular:     Rate and Rhythm: Normal rate and regular rhythm.  No extrasystoles are present.    Heart sounds: Normal heart sounds, S1 normal  and S2 normal.  Pulmonary:     Effort: Pulmonary effort is normal. No respiratory distress.     Breath sounds: Normal breath sounds.  Chest:     Breasts: Breasts are symmetrical.        Right: No inverted nipple, mass, nipple discharge, skin change or tenderness.        Left: No inverted nipple, mass, nipple discharge, skin change or tenderness.  Abdominal:     General: Bowel sounds are normal. There is no distension or abdominal bruit.     Palpations: Abdomen is soft. There is no mass or pulsatile mass.     Tenderness: There is no abdominal tenderness.     Hernia: No hernia is present.  Musculoskeletal: Normal range of motion.  Lymphadenopathy:     Head:     Right side of head: No submandibular adenopathy.     Left side of head: No submandibular adenopathy.     Cervical: No cervical adenopathy.  Skin:    General: Skin is warm and dry.     Coloration: Skin is not pale.     Findings: No bruising or ecchymosis.  Neurological:     Mental Status: She is alert.  Cranial Nerves: No cranial nerve deficit.     Motor: No tremor or abnormal muscle tone.     Gait: Gait normal.     Deep Tendon Reflexes:     Reflex Scores:      Patellar reflexes are 2+ on the right side and 2+ on the left side. Psychiatric:        Mood and Affect: Mood is not anxious or depressed.        Speech: Speech normal.        Behavior: Behavior normal.        Thought Content: Thought content normal.     Results for orders placed or performed in visit on 10/27/17  Lipid panel  Result Value Ref Range   Cholesterol 125 <200 mg/dL   HDL 33 (L) >50 mg/dL   Triglycerides 104 <150 mg/dL   LDL Cholesterol (Calc) 73 mg/dL (calc)   Total CHOL/HDL Ratio 3.8 <5.0 (calc)   Non-HDL Cholesterol (Calc) 92 <130 mg/dL (calc)      Assessment & Plan:   Problem List Items Addressed This Visit      Musculoskeletal and Integument   Neoplasm of uncertain behavior of skin   Relevant Orders   Ambulatory referral to  Dermatology     Other   Vitamin D deficiency    Check vit D and supplement if needed      Relevant Orders   VITAMIN D 25 Hydroxy (Vit-D Deficiency, Fractures)   Tobacco abuse    Encouraged cessation; see AVS      Preventative health care - Primary    USPSTF grade A and B recommendations reviewed with patient; age-appropriate recommendations, preventive care, screening tests, etc discussed and encouraged; healthy living encouraged; see AVS for patient education given to patient       Relevant Orders   CBC   COMPLETE METABOLIC PANEL WITH GFR   Lipid panel   TSH   Obesity    Encouraged weight loss; see AVS       Other Visit Diagnoses    Screen for colon cancer       Relevant Orders   Ambulatory referral to Gastroenterology   Screening for HIV without presence of risk factors       Relevant Orders   HIV Antibody (routine testing w rflx)       Follow up plan: Return in about 1 year (around 04/14/2019) for complete physical.  An after-visit summary was printed and given to the patient at [facility].  Please see the patient instructions which Luo contain other information and recommendations beyond what is mentioned above in the assessment and plan.  Meds ordered this encounter  Medications  . aspirin EC 81 MG tablet    Sig: Take 1 tablet (81 mg total) by mouth daily.    Orders Placed This Encounter  Procedures  . CBC  . COMPLETE METABOLIC PANEL WITH GFR  . HIV Antibody (routine testing w rflx)  . Lipid panel  . TSH  . VITAMIN D 25 Hydroxy (Vit-D Deficiency, Fractures)  . Ambulatory referral to Gastroenterology  . Ambulatory referral to Dermatology

## 2018-04-13 NOTE — Progress Notes (Signed)
Susan Snyder, please let the patient know that her CBC is normal; other labs pending at time of this note

## 2018-04-13 NOTE — Assessment & Plan Note (Signed)
Check vit D and supplement if needed

## 2018-04-13 NOTE — Patient Instructions (Addendum)
I do recommend yearly flu shots; for individuals who don't want flu shots, try to practice excellent hand hygiene, and avoid nursing homes, day cares, and hospitals during peak flu season; taking additional vitamin C daily during flu/cold season Boehm help boost your immune system too  I do encourage you to quit smoking Call [phone] to sign up for smoking cessation classes You can call 1-800-QUIT-NOW to talk with a smoking cessation coach  Check out the information at familydoctor.org entitled "Nutrition for Weight Loss: What You Need to Know about Fad Diets" Try to lose between 1-2 pounds per week by taking in fewer calories and burning off more calories You can succeed by limiting portions, limiting foods dense in calories and fat, becoming more active, and drinking 8 glasses of water a day (64 ounces) Don't skip meals, especially breakfast, as skipping meals Bale alter your metabolism Do not use over-the-counter weight loss pills or gimmicks that claim rapid weight loss A healthy BMI (or body mass index) is between 18.5 and 24.9 You can calculate your ideal BMI at the [place] website ClubMonet[employer].fr  Health Maintenance, Female Adopting a healthy lifestyle and getting preventive care can go a long way to promote health and wellness. Talk with your health care provider about what schedule of regular examinations is right for you. This is a good chance for you to check in with your provider about disease prevention and staying healthy. In between checkups, there are plenty of things you can do on your own. Experts have done a lot of research about which lifestyle changes and preventive measures are most likely to keep you healthy. Ask your health care provider for more information. Weight and diet Eat a healthy diet  Be sure to include plenty of vegetables, fruits, low-fat dairy products, and lean protein.  Do not eat a lot of foods high in  solid fats, added sugars, or salt.  Get regular exercise. This is one of the most important things you can do for your health. ? Most adults should exercise for at least 150 minutes each week. The exercise should increase your heart rate and make you sweat (moderate-intensity exercise). ? Most adults should also do strengthening exercises at least twice a week. This is in addition to the moderate-intensity exercise. Maintain a healthy weight  Body mass index (BMI) is a measurement that can be used to identify possible weight problems. It estimates body fat based on height and weight. Your health care provider can help determine your BMI and help you achieve or maintain a healthy weight.  For females 62 years of age and older: ? A BMI below 18.5 is considered underweight. ? A BMI of 18.5 to 24.9 is normal. ? A BMI of 25 to 29.9 is considered overweight. ? A BMI of 30 and above is considered obese. Watch levels of cholesterol and blood lipids  You should start having your blood tested for lipids and cholesterol at 54 years of age, then have this test every 5 years.  You Wallander need to have your cholesterol levels checked more often if: ? Your lipid or cholesterol levels are high. ? You are older than 54 years of age. ? You are at high risk for heart disease. Cancer screening Lung Cancer  Lung cancer screening is recommended for adults 7-15 years old who are at high risk for lung cancer because of a history of smoking.  A yearly low-dose CT scan of the lungs is recommended for people who: ? Currently smoke. ?  Have quit within the past 15 years. ? Have at least a 30-pack-year history of smoking. A pack year is smoking an average of one pack of cigarettes a day for 1 year.  Yearly screening should continue until it has been 15 years since you quit.  Yearly screening should stop if you develop a health problem that would prevent you from having lung cancer treatment. Breast  Cancer  Practice breast self-awareness. This means understanding how your breasts normally appear and feel.  It also means doing regular breast self-exams. Let your health care provider know about any changes, no matter how small.  If you are in your 20s or 30s, you should have a clinical breast exam (CBE) by a health care provider every 1-3 years as part of a regular health exam.  If you are 63 or older, have a CBE every year. Also consider having a breast X-ray (mammogram) every year.  If you have a family history of breast cancer, talk to your health care provider about genetic screening.  If you are at high risk for breast cancer, talk to your health care provider about having an MRI and a mammogram every year.  Breast cancer gene (BRCA) assessment is recommended for women who have family members with BRCA-related cancers. BRCA-related cancers include: ? Breast. ? Ovarian. ? Tubal. ? Peritoneal cancers.  Results of the assessment will determine the need for genetic counseling and BRCA1 and BRCA2 testing. Cervical Cancer Your health care provider Maxson recommend that you be screened regularly for cancer of the pelvic organs (ovaries, uterus, and vagina). This screening involves a pelvic examination, including checking for microscopic changes to the surface of your cervix (Pap test). You Spearing be encouraged to have this screening done every 3 years, beginning at age 96.  For women ages 74-65, health care providers Wassel recommend pelvic exams and Pap testing every 3 years, or they Ulatowski recommend the Pap and pelvic exam, combined with testing for human papilloma virus (HPV), every 5 years. Some types of HPV increase your risk of cervical cancer. Testing for HPV Rikard also be done on women of any age with unclear Pap test results.  Other health care providers Lalone not recommend any screening for nonpregnant women who are considered low risk for pelvic cancer and who do not have symptoms. Ask your  health care provider if a screening pelvic exam is right for you.  If you have had past treatment for cervical cancer or a condition that could lead to cancer, you need Pap tests and screening for cancer for at least 20 years after your treatment. If Pap tests have been discontinued, your risk factors (such as having a new sexual partner) need to be reassessed to determine if screening should resume. Some women have medical problems that increase the chance of getting cervical cancer. In these cases, your health care provider Dommer recommend more frequent screening and Pap tests. Colorectal Cancer  This type of cancer can be detected and often prevented.  Routine colorectal cancer screening usually begins at 54 years of age and continues through 54 years of age.  Your health care provider Rodelo recommend screening at an earlier age if you have risk factors for colon cancer.  Your health care provider Himes also recommend using home test kits to check for hidden blood in the stool.  A small camera at the end of a tube can be used to examine your colon directly (sigmoidoscopy or colonoscopy). This is done to check for  the earliest forms of colorectal cancer.  Routine screening usually begins at age 21.  Direct examination of the colon should be repeated every 5-10 years through 54 years of age. However, you Ken need to be screened more often if early forms of precancerous polyps or small growths are found. Skin Cancer  Check your skin from head to toe regularly.  Tell your health care provider about any new moles or changes in moles, especially if there is a change in a mole's shape or color.  Also tell your health care provider if you have a mole that is larger than the size of a pencil eraser.  Always use sunscreen. Apply sunscreen liberally and repeatedly throughout the day.  Protect yourself by wearing long sleeves, pants, a wide-brimmed hat, and sunglasses whenever you are outside. Heart  disease, diabetes, and high blood pressure  High blood pressure causes heart disease and increases the risk of stroke. High blood pressure is more likely to develop in: ? People who have blood pressure in the high end of the normal range (130-139/85-89 mm Hg). ? People who are overweight or obese. ? People who are African American.  If you are 54-1 years of age, have your blood pressure checked every 3-5 years. If you are 83 years of age or older, have your blood pressure checked every year. You should have your blood pressure measured twice-once when you are at a hospital or clinic, and once when you are not at a hospital or clinic. Record the average of the two measurements. To check your blood pressure when you are not at a hospital or clinic, you can use: ? An automated blood pressure machine at a pharmacy. ? A home blood pressure monitor.  If you are between 28 years and 41 years old, ask your health care provider if you should take aspirin to prevent strokes.  Have regular diabetes screenings. This involves taking a blood sample to check your fasting blood sugar level. ? If you are at a normal weight and have a low risk for diabetes, have this test once every three years after 54 years of age. ? If you are overweight and have a high risk for diabetes, consider being tested at a younger age or more often. Preventing infection Hepatitis B  If you have a higher risk for hepatitis B, you should be screened for this virus. You are considered at high risk for hepatitis B if: ? You were born in a country where hepatitis B is common. Ask your health care provider which countries are considered high risk. ? Your parents were born in a high-risk country, and you have not been immunized against hepatitis B (hepatitis B vaccine). ? You have HIV or AIDS. ? You use needles to inject street drugs. ? You live with someone who has hepatitis B. ? You have had sex with someone who has hepatitis  B. ? You get hemodialysis treatment. ? You take certain medicines for conditions, including cancer, organ transplantation, and autoimmune conditions. Hepatitis C  Blood testing is recommended for: ? Everyone born from [birth year] through 1966. ? Anyone with known risk factors for hepatitis C. Sexually transmitted infections (STIs)  You should be screened for sexually transmitted infections (STIs) including gonorrhea and chlamydia if: ? You are sexually active and are younger than 54 years of age. ? You are older than 54 years of age and your health care provider tells you that you are at risk for this type of infection. ?  Your sexual activity has changed since you were last screened and you are at an increased risk for chlamydia or gonorrhea. Ask your health care provider if you are at risk.  If you do not have HIV, but are at risk, it Maser be recommended that you take a prescription medicine daily to prevent HIV infection. This is called pre-exposure prophylaxis (PrEP). You are considered at risk if: ? You are sexually active and do not regularly use condoms or know the HIV status of your partner(s). ? You take drugs by injection. ? You are sexually active with a partner who has HIV. Talk with your health care provider about whether you are at high risk of being infected with HIV. If you choose to begin PrEP, you should first be tested for HIV. You should then be tested every 3 months for as long as you are taking PrEP. Pregnancy  If you are premenopausal and you Kaatz become pregnant, ask your health care provider about preconception counseling.  If you Nestle become pregnant, take 400 to 800 micrograms (mcg) of folic acid every day.  If you want to prevent pregnancy, talk to your health care provider about birth control (contraception). Osteoporosis and menopause  Osteoporosis is a disease in which the bones lose minerals and strength with aging. This can result in serious bone fractures. Your  risk for osteoporosis can be identified using a bone density scan.  If you are 71 years of age or older, or if you are at risk for osteoporosis and fractures, ask your health care provider if you should be screened.  Ask your health care provider whether you should take a calcium or vitamin D supplement to lower your risk for osteoporosis.  Menopause Broker have certain physical symptoms and risks.  Hormone replacement therapy Requena reduce some of these symptoms and risks. Talk to your health care provider about whether hormone replacement therapy is right for you. Follow these instructions at home:  Schedule regular health, dental, and eye exams.  Stay current with your immunizations.  Do not use any tobacco products including cigarettes, chewing tobacco, or electronic cigarettes.  If you are pregnant, do not drink alcohol.  If you are breastfeeding, limit how much and how often you drink alcohol.  Limit alcohol intake to no more than 1 drink per day for nonpregnant women. One drink equals 12 ounces of beer, 5 ounces of wine, or 1 ounces of hard liquor.  Do not use street drugs.  Do not share needles.  Ask your health care provider for help if you need support or information about quitting drugs.  Tell your health care provider if you often feel depressed.  Tell your health care provider if you have ever been abused or do not feel safe at home. This information is not intended to replace advice given to you by your health care provider. Make sure you discuss any questions you have with your health care provider. Document Released: 09/21/2010 Document Revised: 08/14/2015 Document Reviewed: 12/10/2014 Elsevier Interactive Patient Education  2019 [place] Risks of Smoking Smoking cigarettes is very bad for your health. Tobacco smoke has over 200 known poisons in it. It contains the poisonous gases nitrogen oxide and carbon monoxide. There are over 60 chemicals in tobacco  smoke that cause cancer. Smoking is difficult to quit because a chemical in tobacco, called nicotine, causes addiction or dependence. When you smoke and inhale, nicotine is absorbed rapidly into the bloodstream through your lungs. Both inhaled  and non-inhaled nicotine Dyment be addictive. What are the risks of cigarette smoke? Cigarette smokers have an increased risk of many serious medical problems, including:  Lung cancer.  Lung disease, such as pneumonia, bronchitis, and emphysema.  Chest pain (angina) and heart attack because the heart is not getting enough oxygen.  Heart disease and peripheral blood vessel disease.  High blood pressure (hypertension).  Stroke.  Oral cancer, including cancer of the lip, mouth, or voice box.  Bladder cancer.  Pancreatic cancer.  Cervical cancer.  Pregnancy complications, including premature birth.  Stillbirths and smaller newborn babies, birth defects, and genetic damage to sperm.  Early menopause.  Lower estrogen level for women.  Infertility.  Facial wrinkles.  Blindness.  Increased risk of broken bones (fractures).  Senile dementia.  Stomach ulcers and internal bleeding.  Delayed wound healing and increased risk of complications during surgery.  Even smoking lightly shortens your life expectancy by several years. Because of secondhand smoke exposure, children of smokers have an increased risk of the following:  Sudden infant death syndrome (SIDS).  Respiratory infections.  Lung cancer.  Heart disease.  Ear infections. What are the benefits of quitting? There are many health benefits of quitting smoking. Here are some of them:  Within days of quitting smoking, your risk of having a heart attack decreases, your blood flow improves, and your lung capacity improves. Blood pressure, pulse rate, and breathing patterns start returning to normal soon after quitting.  Within months, your lungs Zambito clear up  completely.  Quitting for 10 years reduces your risk of developing lung cancer and heart disease to almost that of a nonsmoker.  People who quit Kelsay see an improvement in their overall quality of life. How do I quit smoking?     Smoking is an addiction with both physical and psychological effects, and longtime habits can be hard to change. Your health care provider can recommend:  Programs and community resources, which Walmer include group support, education, or talk therapy.  Prescription medicines to help reduce cravings.  Nicotine replacement products, such as patches, gum, and nasal sprays. Use these products only as directed. Do not replace cigarette smoking with electronic cigarettes, which are commonly called e-cigarettes. The safety of e-cigarettes is not known, and some Boschert contain harmful chemicals.  A combination of two or more of these methods. Where to find more information  American Lung Association: www.lung.org  American Cancer Society: www.cancer.org Summary  Smoking cigarettes is very bad for your health. Cigarette smokers have an increased risk of many serious medical problems, including several cancers, heart disease, and stroke.  Smoking is an addiction with both physical and psychological effects, and longtime habits can be hard to change.  By stopping right away, you can greatly reduce the risk of medical problems for you and your family.  To help you quit smoking, your health care provider can recommend programs, community resources, prescription medicines, and nicotine replacement products such as patches, gum, and nasal sprays. This information is not intended to replace advice given to you by your health care provider. Make sure you discuss any questions you have with your health care provider. Document Released: 04/15/2004 Document Revised: 06/09/2017 Document Reviewed: 03/12/2016 Elsevier Interactive Patient Education  2019 Elsevier Inc.  Obesity,  Adult Obesity is the condition of having too much total body fat. Being overweight or obese means that your weight is greater than what is considered healthy for your body size. Obesity is determined by a measurement called BMI. BMI  is an estimate of body fat and is calculated from height and weight. For adults, a BMI of 30 or higher is considered obese. Obesity can eventually lead to other health concerns and major illnesses, including:  Stroke.  Coronary artery disease (CAD).  Type 2 diabetes.  Some types of cancer, including cancers of the colon, breast, uterus, and gallbladder.  Osteoarthritis.  High blood pressure (hypertension).  High cholesterol.  Sleep apnea.  Gallbladder stones.  Infertility problems. What are the causes? The main cause of obesity is taking in (consuming) more calories than your body uses for energy. Other factors that contribute to this condition Pryde include:  Being born with genes that make you more likely to become obese.  Having a medical condition that causes obesity. These conditions include: ? Hypothyroidism. ? Polycystic ovarian syndrome (PCOS). ? Binge-eating disorder. ? Cushing syndrome.  Taking certain medicines, such as steroids, antidepressants, and seizure medicines.  Not being physically active (sedentary lifestyle).  Living where there are limited places to exercise safely or buy healthy foods.  Not getting enough sleep. What increases the risk? The following factors Knupp increase your risk of this condition:  Having a family history of obesity.  Being a woman of African-American descent.  Being a man of Hispanic descent. What are the signs or symptoms? Having excessive body fat is the main symptom of this condition. How is this diagnosed? This condition Tippy be diagnosed based on:  Your symptoms.  Your medical history.  A physical exam. Your health care provider Willison measure: ? Your BMI. If you are an adult with a BMI  between 25 and less than 30, you are considered overweight. If you are an adult with a BMI of 30 or higher, you are considered obese. ? The distances around your hips and your waist (circumferences). These Fairbank be compared to each other to help diagnose your condition. ? Your skinfold thickness. Your health care provider Mckeel gently pinch a fold of your skin and measure it. How is this treated? Treatment for this condition often includes changing your lifestyle. Treatment Wolfer include some or all of the following:  Dietary changes. Work with your health care provider and a dietitian to set a weight-loss goal that is healthy and reasonable for you. Dietary changes Decoster include eating: ? Smaller portions. A portion size is the amount of a particular food that is healthy for you to eat at one time. This varies from person to person. ? Low-calorie or low-fat options. ? More whole grains, fruits, and vegetables.  Regular physical activity. This Lumley include aerobic activity (cardio) and strength training.  Medicine to help you lose weight. Your health care provider Will prescribe medicine if you are unable to lose 1 pound a week after 6 weeks of eating more healthily and doing more physical activity.  Surgery. Surgical options Cartmell include gastric banding and gastric bypass. Surgery Gosdin be done if: ? Other treatments have not helped to improve your condition. ? You have a BMI of 40 or higher. ? You have life-threatening health problems related to obesity. Follow these instructions at home:  Eating and drinking   Follow recommendations from your health care provider about what you eat and drink. Your health care provider Betzer advise you to: ? Limit fast foods, sweets, and processed snack foods. ? Choose low-fat options, such as low-fat milk instead of whole milk. ? Eat 5 or more servings of fruits or vegetables every day. ? Eat at home more often. This  gives you more control over what you  eat. ? Choose healthy foods when you eat out. ? Learn what a healthy portion size is. ? Keep low-fat snacks on hand. ? Avoid sugary drinks, such as soda, fruit juice, iced tea sweetened with sugar, and flavored milk. ? Eat a healthy breakfast.  Drink enough water to keep your urine clear or pale yellow.  Do not go without eating for long periods of time (do not fast) or follow a fad diet. Fasting and fad diets can be unhealthy and even dangerous. Physical Activity  Exercise regularly, as told by your health care provider. Ask your health care provider what types of exercise are safe for you and how often you should exercise.  Warm up and stretch before being active.  Cool down and stretch after being active.  Rest between periods of activity. Lifestyle  Limit the time that you spend in front of your TV, computer, or video game system.  Find ways to reward yourself that do not involve food.  Limit alcohol intake to no more than 1 drink a day for nonpregnant women and 2 drinks a day for men. One drink equals 12 oz of beer, 5 oz of wine, or 1 oz of hard liquor. General instructions  Keep a weight loss journal to keep track of the food you eat and how much you exercise you get.  Take over-the-counter and prescription medicines only as told by your health care provider.  Take vitamins and supplements only as told by your health care provider.  Consider joining a support group. Your health care provider Kasa be able to recommend a support group.  Keep all follow-up visits as told by your health care provider. This is important. Contact a health care provider if:  You are unable to meet your weight loss goal after 6 weeks of dietary and lifestyle changes. This information is not intended to replace advice given to you by your health care provider. Make sure you discuss any questions you have with your health care provider. Document Released: 04/15/2004 Document Revised: 08/11/2015  Document Reviewed: 12/25/2014 Elsevier Interactive Patient Education  2019 [employer].

## 2018-04-13 NOTE — Assessment & Plan Note (Signed)
Encouraged weight loss; see AVS 

## 2018-04-13 NOTE — Assessment & Plan Note (Signed)
USPSTF grade A and B recommendations reviewed with patient; age-appropriate recommendations, preventive care, screening tests, etc discussed and encouraged; healthy living encouraged; see AVS for patient education given to patient  

## 2018-04-13 NOTE — Assessment & Plan Note (Signed)
Encouraged cessation; see AVS

## 2018-04-14 ENCOUNTER — Other Ambulatory Visit: Payer: Self-pay | Admitting: Family Medicine

## 2018-04-14 LAB — COMPLETE METABOLIC PANEL WITH GFR
AG Ratio: 1.7 (calc) (ref 1.0–2.5)
ALT: 12 U/L (ref 6–29)
AST: 14 U/L (ref 10–35)
Albumin: 4.2 g/dL (ref 3.6–5.1)
Alkaline phosphatase (APISO): 89 U/L (ref 33–130)
BUN: 15 mg/dL (ref 7–25)
CO2: 27 mmol/L (ref 20–32)
Calcium: 9.5 mg/dL (ref 8.6–10.4)
Chloride: 109 mmol/L (ref 98–110)
Creat: 0.86 mg/dL (ref 0.50–1.05)
GFR, Est African American: 89 mL/min/{1.73_m2} (ref >=60)
GFR, Est Non African American: 77 mL/min/{1.73_m2} (ref >=60)
Globulin: 2.5 g/dL (calc) (ref 1.9–3.7)
Glucose, Bld: 85 mg/dL (ref 65–99)
Potassium: 4.7 mmol/L (ref 3.5–5.3)
Sodium: 143 mmol/L (ref 135–146)
Total Bilirubin: 0.4 mg/dL (ref 0.2–1.2)
Total Protein: 6.7 g/dL (ref 6.1–8.1)

## 2018-04-14 LAB — CBC
HCT: 38.9 % (ref 35.0–45.0)
Hemoglobin: 13 g/dL (ref 11.7–15.5)
MCH: 29.9 pg (ref 27.0–33.0)
MCHC: 33.4 g/dL (ref 32.0–36.0)
MCV: 89.4 fL (ref 80.0–100.0)
MPV: 11.3 fL (ref 7.5–12.5)
Platelets: 324 10*3/uL (ref 140–400)
RBC: 4.35 10*6/uL (ref 3.80–5.10)
RDW: 12.7 % (ref 11.0–15.0)
WBC: 10.3 10*3/uL (ref 3.8–10.8)

## 2018-04-14 LAB — LIPID PANEL
Cholesterol: 144 mg/dL (ref <200)
HDL: 32 mg/dL — ABNORMAL LOW (ref >50)
LDL Cholesterol (Calc): 90 mg/dL (calc)
Non-HDL Cholesterol (Calc): 112 mg/dL (calc) (ref <130)
Total CHOL/HDL Ratio: 4.5 (calc) (ref <5.0)
Triglycerides: 122 mg/dL (ref <150)

## 2018-04-14 LAB — HIV ANTIBODY (ROUTINE TESTING W REFLEX): HIV 1&2 Ab, 4th Generation: NONREACTIVE

## 2018-04-14 LAB — VITAMIN D 25 HYDROXY (VIT D DEFICIENCY, FRACTURES): Vit D, 25-Hydroxy: 18 ng/mL — ABNORMAL LOW (ref 30–100)

## 2018-04-14 LAB — TSH: TSH: 1.32 mIU/L

## 2018-04-14 MED ORDER — VITAMIN D (ERGOCALCIFEROL) 1.25 MG (50000 UNIT) PO CAPS: 50000.0000 [IU] | ORAL_CAPSULE | ORAL | 1 refills | Status: DC

## 2018-04-14 NOTE — Progress Notes (Signed)
Susan Snyder, please let the patient know that her other labs are back Cholesterol shows HDL is too low; she can bring that up on her own with exercise and weight loss HIV is negative Thyroid test is normal Vitamin D is quite low; start Rx vitamin D once a week for 8 weeks, then just take 1,000 iu of OTC vitamin D3 once a day if not getting enough sun outside

## 2018-04-14 NOTE — Progress Notes (Signed)
rx vit D

## 2018-05-01 ENCOUNTER — Encounter: Payer: Self-pay | Admitting: *Deleted

## 2018-05-10 ENCOUNTER — Other Ambulatory Visit: Payer: Self-pay | Admitting: Family Medicine

## 2018-06-26 ENCOUNTER — Encounter: Payer: Self-pay | Admitting: Nurse Practitioner

## 2018-06-26 ENCOUNTER — Ambulatory Visit (INDEPENDENT_AMBULATORY_CARE_PROVIDER_SITE_OTHER): Payer: BLUE CROSS/BLUE SHIELD | Admitting: Nurse Practitioner

## 2018-06-26 ENCOUNTER — Other Ambulatory Visit: Payer: Self-pay

## 2018-06-26 VITALS — BP 124/76 | HR 80 | Temp 98.3°F | Resp 14 | Ht 68.0 in | Wt 221.2 lb

## 2018-06-26 DIAGNOSIS — I10 Essential (primary) hypertension: Secondary | ICD-10-CM | POA: Diagnosis not present

## 2018-06-26 DIAGNOSIS — E669 Obesity, unspecified: Secondary | ICD-10-CM

## 2018-06-26 DIAGNOSIS — E559 Vitamin D deficiency, unspecified: Secondary | ICD-10-CM | POA: Diagnosis not present

## 2018-06-26 DIAGNOSIS — R399 Unspecified symptoms and signs involving the genitourinary system: Secondary | ICD-10-CM | POA: Diagnosis not present

## 2018-06-26 DIAGNOSIS — E538 Deficiency of other specified B group vitamins: Secondary | ICD-10-CM

## 2018-06-26 DIAGNOSIS — F419 Anxiety disorder, unspecified: Secondary | ICD-10-CM

## 2018-06-26 DIAGNOSIS — Z720 Tobacco use: Secondary | ICD-10-CM

## 2018-06-26 NOTE — Patient Instructions (Addendum)
-  If you have not heard anything from my staff in a week about any orders/referrals/studies from today, please contact us here to follow-up [phone].  -Good cholesterol, also called high-density lipoprotein (HDL) removes extra cholesterol and plaque buildup in your arteries and then sends it to your liver to get rid of and helps reduce your risk of heart disease, heart attack, and stroke.Foods that increase HDL: beans and legumes, whole grains, high-fiber fruits:prunes, apples, and pears; fatty fish- salmon, tuna, sardines; nuts, olive oil    Your symptoms should begin to improve within a day of starting antibiotics. But you should finish all the antibiotic pills you get to ensure the bacteria is killed and prevent antibiotic resistance.  If you are having pain when you pee, you can also take a medicine to numb your bladder. Look for Phenazopyridine (Pyridium or AZO) over the counter at your pharmacy. This medicine eases the pain caused by urinary tract infections. It also reduces the need to urinate.  If you frequently get UTI's here are some prevention tips:  - Avoiding spermicides  - Drinking more fluid - This can help prevent bladder infections. ?Urinating right after sex - Some doctors think this helps, because it helps flush out germs that might get into the bladder during sex. There is no proof it works, but it also cannot hurt. ?Vaginal estrogen - If you are a woman who has already been through menopause, your doctor might suggest this. Vaginal estrogen comes in a cream or a flexible ring that you put into your vagina. It can help prevent bladder infections. ?Antibiotics - If you get a lot of bladder infections, and the above methods have not helped, your doctor might give you antibiotics to help prevent infection. But taking antibiotics has downsides, so doctors usually suggest trying other things first   The studies suggesting that cranberry products prevent bladder infections are  not very good. Other studies suggest that cranberry products do not prevent bladder infections. But if you want to try cranberry products for this purpose, there is probably not much harm in doing so.

## 2018-06-26 NOTE — Progress Notes (Signed)
Name: Susan Snyder   MRN: [id number]    DOB: 01-06-1966   Date:06/26/2018       Progress Note  Subjective  Chief Complaint  Chief Complaint  Patient presents with  . Urinary Tract Infection    Onset 1 week ago. Low back pain, burning, frequency, urgency, decreased output.    HPI  UTI symptoms Almost a week ago patient noted that she had dysuria, suprapubic pain,urinary frequency and urgency, drinking plenty of water, cranberry supplements. States normally UTI symptoms resolve with drinking plenty of water but this time it did not.   Denies fevers, chills, nausea, vomiting.   Hyperlipidemia  rx lipitor 35m nightly, no issues.  Lab Results  Component Value Date   CHOL 144 04/13/2018   HDL 32 (L) 04/13/2018   LDLCALC 90 04/13/2018   TRIG 122 04/13/2018   CHOLHDL 4.5 04/13/2018    Hypertension rx metoprolol 225mdaily  BP Readings from Last 3 Encounters:  06/26/18 124/76  04/13/18 116/82  08/25/17 124/82   Vitamin D deficiency Was rx 8 week prescription strength vitamin D and instructed to take OTC after she had completed the course. Takes 1000 IU daily vitamin D.   Obesity States she feels fatter because she has been eating more, having more junk food.   Wt Readings from Last 3 Encounters:  06/26/18 221 lb 3.2 oz (100.3 kg)  04/13/18 221 lb 6.4 oz (100.4 kg)  08/25/17 223 lb 14.4 oz (101.6 kg)    Stress reaction and smoking cessation  Was rx buproprion 30032maily. Takes it daily.  Was smoking 1ppd and now its down to 0.5ppd.  States feels anxiety is well controlled overall but with pandemic she has increased worry specific to this not overall.    GAD 7 : Generalized Anxiety Score 06/26/2018  Nervous, Anxious, on Edge 3  Control/stop worrying 3  Worry too much - different things 0  Trouble relaxing 0  Restless 3  Easily annoyed or irritable 0  Afraid - awful might happen 1  Total GAD 7 Score 10  Anxiety Difficulty Not difficult at all      PHQ2/9: [facility] 06/26/2018 04/13/2018 08/25/2017 06/24/2016 05/20/2016  Decreased Interest 0 0 0 0 0  Down, Depressed, Hopeless 0 0 1 0 0  PHQ - 2 Score 0 0 1 0 0  Altered sleeping 0 0 0 - -  Tired, decreased energy 0 0 0 - -  Change in appetite 0 0 0 - -  Feeling bad or failure about yourself  0 0 0 - -  Trouble concentrating 0 0 0 - -  Moving slowly or fidgety/restless 0 0 0 - -  Suicidal thoughts 0 0 0 - -  PHQ-9 Score 0 0 1 - -  Difficult doing work/chores Not difficult at all Not difficult at all Not difficult at all - -     PHQ reviewed. Negative  Patient Active Problem List   Diagnosis Date Noted  . Preventative health care 05/20/2016  . Neoplasm of uncertain behavior of skin 05/20/2016  . 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 Vitamin B12 deficiency 01/14/2015  . Vitamin D deficiency 01/14/2015  . Obesity 01/14/2015  . Hx of iron deficiency anemia 01/14/2015  . Frequent headaches 12/28/2014  . Abnormal finding on MRI of brain 12/28/2014  . Tobacco abuse 12/23/2014  . Stress reaction 12/23/2014  . Hypertension   . Renal cyst   . Collagenous colitis   . H/O bilateral hip replacements 06/28/2014  .  Celiac disease 01/02/2014  . Primary localized osteoarthrosis, pelvic region and thigh 10/25/2013  . Cervical spondylosis 02/06/2013    Past Medical History:  Diagnosis Date  . Chronic diarrhea   . Chronic gastritis Sept. 2015   on EGD   . Collagenous colitis    colonoscopy biopsies 11/01/13  . Hyperplastic polyps of stomach    Aug 2015  . Hypertension   . Neoplasm of uncertain behavior of skin 05/20/2016   Hx of skin cancer, BCC  . Renal cyst     Past Surgical History:  Procedure Laterality Date  . ABDOMINAL HYSTERECTOMY  2005   partial, still has ovaries, due to fibroids.  Marland Kitchen [place]   x 4  . COLONOSCOPY W/ BIOPSIES  11/01/13   collagenous colitis, sigmoid polypectomy x 2; hyperplastic polyps  . ESOPHAGOGASTRODUODENOSCOPY   11/22/13   chronic gastritis  . HIP ARTHROPLASTY Bilateral 01/15/14,03/05/14   total hip, Dr. Nilsa Nutting  . NECK SURGERY  2015    Social History   Tobacco Use  . Smoking status: Current Every Day Smoker    Packs/day: 0.50    Types: Cigarettes  . Smokeless tobacco: Never Used  Substance Use Topics  . Alcohol use: No     Current Outpatient Medications:  .  aspirin EC 81 MG tablet, Take 1 tablet (81 mg total) by mouth daily., Disp: , Rfl:  .  atorvastatin (LIPITOR) 10 MG tablet, Take 1 tablet (10 mg total) by mouth at bedtime., Disp: 30 tablet, Rfl: 11 .  buPROPion (WELLBUTRIN XL) 300 MG 24 hr tablet, Take 1 tablet (300 mg total) by mouth daily., Disp: 90 tablet, Rfl: 3 .  loperamide (IMODIUM) 2 MG capsule, Take 2 mg by mouth as needed. , Disp: , Rfl:  .  metoprolol succinate (TOPROL-XL) 25 MG 24 hr tablet, Take 1 tablet (25 mg total) by mouth daily., Disp: 90 tablet, Rfl: 3 .  Vitamin D, Ergocalciferol, (DRISDOL) 1.25 MG (50000 UT) CAPS capsule, Take 1 capsule (50,000 Units total) by mouth every 7 (seven) days., Disp: 4 capsule, Rfl: 1  Allergies  Allergen Reactions  . Adhesive [Tape]   . Gabapentin Nausea Only    Higher Dose causes vomiting Higher Dose causes vomiting  . Keflex [Cephalexin]   . Penicillins   . Sulfa Antibiotics     ROS    No other specific complaints in a complete review of systems (except as listed in HPI above).  Objective  Vitals:   06/26/18 0947  BP: 124/76  Pulse: 80  Resp: 14  Temp: 98.3 F (36.8 C)  SpO2: 96%  Weight: 221 lb 3.2 oz (100.3 kg)  Height: 5' 8"  (1.727 m)    Body mass index is 33.63 kg/m.  Nursing Note and Vital Signs reviewed.  Physical Exam Vitals signs reviewed.  Constitutional:      Appearance: She is well-developed.  HENT:     Head: Normocephalic and atraumatic.  Neck:     Musculoskeletal: Normal range of motion and neck supple.     Vascular: No carotid bruit.  Cardiovascular:     Heart sounds: Normal  heart sounds.  Pulmonary:     Effort: Pulmonary effort is normal.     Breath sounds: Normal breath sounds.  Abdominal:     General: Bowel sounds are normal.     Palpations: Abdomen is soft.     Tenderness: There is no abdominal tenderness.  Musculoskeletal: Normal range of motion.  Skin:  General: Skin is warm and dry.     Capillary Refill: Capillary refill takes less than 2 seconds.  Neurological:     Mental Status: She is alert and oriented to person, place, and time.     GCS: GCS eye subscore is 4. GCS verbal subscore is 5. GCS motor subscore is 6.     Sensory: No sensory deficit.  Psychiatric:        Speech: Speech normal.        Behavior: Behavior normal.        Thought Content: Thought content normal.        Judgment: Judgment normal.        No results found for this or any previous visit (from the past 48 hour(s)).  Assessment & Plan 1. UTI symptoms Patient has been taking AZO unable to dipstick, will treat based off results.  - Urinalysis, Routine w reflex microscopic  2. Vitamin D deficiency Stable, continue OTC   3. Vitamin B12 deficiency Stable, was taken off supplements, no symptoms.   4. Essential hypertension Stable, continue BB  5. Anxiety States she take wellbutrin for this, did not help with smoking, states wellbutrin helps her with this, suspect underlying depression vs anxiety based on medication helping symptoms  6. Tobacco abuse Still smoking, cut down but has no desire to quit  7. Obesity (BMI 30-39.9) Discussed diet and exercise.    -Red flags and when to present for emergency care or RTC including fever >101.56F, chest pain, shortness of breath, new/worsening/un-resolving symptoms,  reviewed with patient at time of visit. Follow up and care instructions discussed and provided in AVS.

## 2018-06-27 ENCOUNTER — Other Ambulatory Visit: Payer: Self-pay | Admitting: Nurse Practitioner

## 2018-06-27 LAB — URINALYSIS, ROUTINE W REFLEX MICROSCOPIC
Bacteria, UA: NONE SEEN /HPF
Bilirubin Urine: NEGATIVE
Glucose, UA: NEGATIVE
Hgb urine dipstick: NEGATIVE
Hyaline Cast: NONE SEEN /LPF
Ketones, ur: NEGATIVE
Nitrite: POSITIVE — AB
Specific Gravity, Urine: 1.026 (ref 1.001–1.03)
pH: 5.5 (ref 5.0–8.0)

## 2018-06-27 MED ORDER — NITROFURANTOIN MONOHYD MACRO 100 MG PO CAPS: 100.0000 mg | ORAL_CAPSULE | Freq: Two times a day (BID) | ORAL | 0 refills | Status: DC

## 2018-06-28 ENCOUNTER — Other Ambulatory Visit: Payer: Self-pay | Admitting: Family Medicine

## 2018-06-29 NOTE — Telephone Encounter (Signed)
See last vit D instructions from jan 2020: Vitamin D is quite low; start Rx vitamin D once a week for 8 weeks, then just take 1,000 iu of OTC vitamin D3 once a day if not getting enough sun outside

## 2018-06-29 NOTE — Telephone Encounter (Signed)
Left voice mail

## 2018-07-07 ENCOUNTER — Other Ambulatory Visit: Payer: Self-pay

## 2018-07-07 ENCOUNTER — Ambulatory Visit (INDEPENDENT_AMBULATORY_CARE_PROVIDER_SITE_OTHER): Payer: BLUE CROSS/BLUE SHIELD | Admitting: Nurse Practitioner

## 2018-07-07 ENCOUNTER — Encounter: Payer: Self-pay | Admitting: Family Medicine

## 2018-07-07 DIAGNOSIS — N390 Urinary tract infection, site not specified: Secondary | ICD-10-CM | POA: Diagnosis not present

## 2018-07-07 MED ORDER — CIPROFLOXACIN HCL 500 MG PO TABS: 500.0000 mg | ORAL_TABLET | Freq: Two times a day (BID) | ORAL | 0 refills | Status: DC

## 2018-07-07 NOTE — Progress Notes (Signed)
Virtual Visit via Video Note  I connected with Susan Snyder on 07/07/18 at 11:20 AM EDT by a video enabled telemedicine application and verified that I am speaking with the correct person using two identifiers.   Staff discussed the limitations of evaluation and management by telemedicine and the availability of in person appointments. The patient expressed understanding and agreed to proceed.  Patient location: Work My location: Home office Other people present:  none HPI  Patient states took macrobid and completed course, symptoms improved but never completely resolved. Patient is having dysuria, dribbling, urgency. Has suprapubic pressure- worse with paints.   No fevers, chills, nausea, vomiting.  Has one cup of coffee and one tea a day. Drinks at least 3 bottles of water and 16 ounce glass of cranberry unsweetened.    PHQ2/9: Depression screen [facility] 2/9 06/26/2018 04/13/2018 08/25/2017 06/24/2016 05/20/2016  Decreased Interest 0 0 0 0 0  Down, Depressed, Hopeless 0 0 1 0 0  PHQ - 2 Score 0 0 1 0 0  Altered sleeping 0 0 0 - -  Tired, decreased energy 0 0 0 - -  Change in appetite 0 0 0 - -  Feeling bad or failure about yourself  0 0 0 - -  Trouble concentrating 0 0 0 - -  Moving slowly or fidgety/restless 0 0 0 - -  Suicidal thoughts 0 0 0 - -  PHQ-9 Score 0 0 1 - -  Difficult doing work/chores Not difficult at all Not difficult at all Not difficult at all - -    PHQ reviewed. Negative  Patient Active Problem List   Diagnosis Date Noted  . Preventative health care 05/20/2016  . Neoplasm of uncertain behavior of skin 05/20/2016  . 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 Vitamin B12 deficiency 01/14/2015  . Vitamin D deficiency 01/14/2015  . Obesity 01/14/2015  . Hx of iron deficiency anemia 01/14/2015  . Frequent headaches 12/28/2014  . Abnormal finding on MRI of brain 12/28/2014  . Tobacco abuse 12/23/2014  . Stress reaction 12/23/2014  . Hypertension   . Renal cyst   . Collagenous  colitis   . H/O bilateral hip replacements 06/28/2014  . Celiac disease 01/02/2014  . Primary localized osteoarthrosis, pelvic region and thigh 10/25/2013  . Cervical spondylosis 02/06/2013    Past Medical History:  Diagnosis Date  . Chronic diarrhea   . Chronic gastritis Sept. 2015   on EGD   . Collagenous colitis    colonoscopy biopsies 11/01/13  . Hyperplastic polyps of stomach    Aug 2015  . Hypertension   . Neoplasm of uncertain behavior of skin 05/20/2016   Hx of skin cancer, BCC  . Renal cyst     Past Surgical History:  Procedure Laterality Date  . ABDOMINAL HYSTERECTOMY  2005   partial, still has ovaries, due to fibroids.  Marland Kitchen [place]   x 4  . COLONOSCOPY W/ BIOPSIES  11/01/13   collagenous colitis, sigmoid polypectomy x 2; hyperplastic polyps  . ESOPHAGOGASTRODUODENOSCOPY  11/22/13   chronic gastritis  . HIP ARTHROPLASTY Bilateral 01/15/14,03/05/14   total hip, Dr. Nilsa Nutting  . NECK SURGERY  2015    Social History   Tobacco Use  . Smoking status: Current Every Day Smoker    Packs/day: 0.50    Types: Cigarettes  . Smokeless tobacco: Never Used  Substance Use Topics  . Alcohol use: No     Current Outpatient Medications:  .  aspirin EC 81  MG tablet, Take 1 tablet (81 mg total) by mouth daily., Disp: , Rfl:  .  atorvastatin (LIPITOR) 10 MG tablet, Take 1 tablet (10 mg total) by mouth at bedtime., Disp: 30 tablet, Rfl: 11 .  buPROPion (WELLBUTRIN XL) 300 MG 24 hr tablet, Take 1 tablet (300 mg total) by mouth daily., Disp: 90 tablet, Rfl: 3 .  loperamide (IMODIUM) 2 MG capsule, Take 2 mg by mouth as needed. , Disp: , Rfl:  .  metoprolol succinate (TOPROL-XL) 25 MG 24 hr tablet, Take 1 tablet (25 mg total) by mouth daily., Disp: 90 tablet, Rfl: 3 .  Vitamin D, Cholecalciferol, 25 MCG (1000 UT) TABS, Take 1 tablet by mouth daily., Disp: , Rfl:  .  nitrofurantoin, macrocrystal-monohydrate, (MACROBID) 100 MG capsule, Take 1 capsule (100 mg  total) by mouth 2 (two) times daily. (Patient not taking: Reported on 07/07/2018), Disp: 10 capsule, Rfl: 0 .  Vitamin D, Ergocalciferol, (DRISDOL) 1.25 MG (50000 UT) CAPS capsule, Take 1 capsule (50,000 Units total) by mouth every 7 (seven) days. (Patient not taking: Reported on 07/07/2018), Disp: 4 capsule, Rfl: 1  Allergies  Allergen Reactions  . Adhesive [Tape]   . Gabapentin Nausea Only    Higher Dose causes vomiting Higher Dose causes vomiting  . Keflex [Cephalexin]   . Penicillins   . Sulfa Antibiotics     ROS  No other specific complaints in a complete review of systems (except as listed in HPI above).  Objective  There were no vitals filed for this visit.   There is no height or weight on file to calculate BMI.  Nursing Note and Vital Signs reviewed.  Physical Exam  Constitutional: Patient appears well-developed and well-nourished. No distress.  HENT: Head: Normocephalic and atraumatic. Pulmonary/Chest: Effort normal  Musculoskeletal: Normal range of motion,  Abdominal: mild suprapubic tenderness with self palpation. Neurological: he is alert and oriented to person, place, and time. speech is normal.  Skin: No rash noted. No erythema.  Psychiatric: Patient has a normal mood and affect. behavior is normal. Judgment and thought content normal.    Assessment & Plan  1. Complicated UTI (urinary tract infection) Patients previous UTI symptoms never resolved, discussed risks of medications, patient has taken this before.  - ciprofloxacin (CIPRO) 500 MG tablet; Take 1 tablet (500 mg total) by mouth 2 (two) times daily.  Dispense: 14 tablet; Refill: 0   Follow Up Instructions: If unimproved by Monday let us know and drop off urine sample then.    I discussed the assessment and treatment plan with the patient. The patient was provided an opportunity to ask questions and all were answered. The patient agreed with the plan and demonstrated an understanding of the  instructions.   The patient was advised to call back or seek an in-person evaluation if the symptoms worsen or if the condition fails to improve as anticipated.  I provided 6 minutes of non-face-to-face time during this encounter. Additional time spent in chart review and orders.   Fredderick Severance, NP

## 2018-08-20 ENCOUNTER — Observation Stay
Admission: EM | Admit: 2018-08-20 | Discharge: 2018-08-21 | Disposition: A | Discharge status: Home / Self Care | Payer: BLUE CROSS/BLUE SHIELD | Attending: General Surgery | Admitting: General Surgery

## 2018-08-20 ENCOUNTER — Emergency Department: Payer: BLUE CROSS/BLUE SHIELD

## 2018-08-20 ENCOUNTER — Encounter
Admission: EM | Disposition: A | Discharge status: Home / Self Care | Payer: Self-pay | Source: Home / Self Care | Attending: Emergency Medicine

## 2018-08-20 ENCOUNTER — Observation Stay: Payer: BLUE CROSS/BLUE SHIELD | Admitting: Anesthesiology

## 2018-08-20 ENCOUNTER — Other Ambulatory Visit: Payer: Self-pay

## 2018-08-20 DIAGNOSIS — Z7982 Long term (current) use of aspirin: Secondary | ICD-10-CM | POA: Insufficient documentation

## 2018-08-20 DIAGNOSIS — I10 Essential (primary) hypertension: Secondary | ICD-10-CM | POA: Insufficient documentation

## 2018-08-20 DIAGNOSIS — Z880 Allergy status to penicillin: Secondary | ICD-10-CM | POA: Diagnosis not present

## 2018-08-20 DIAGNOSIS — F17210 Nicotine dependence, cigarettes, uncomplicated: Secondary | ICD-10-CM | POA: Diagnosis not present

## 2018-08-20 DIAGNOSIS — Z888 Allergy status to other drugs, medicaments and biological substances status: Secondary | ICD-10-CM | POA: Insufficient documentation

## 2018-08-20 DIAGNOSIS — Z882 Allergy status to sulfonamides status: Secondary | ICD-10-CM | POA: Insufficient documentation

## 2018-08-20 DIAGNOSIS — K3533 Acute appendicitis with perforation and localized peritonitis, with abscess: Secondary | ICD-10-CM | POA: Diagnosis not present

## 2018-08-20 DIAGNOSIS — Z881 Allergy status to other antibiotic agents status: Secondary | ICD-10-CM | POA: Insufficient documentation

## 2018-08-20 DIAGNOSIS — Z79899 Other long term (current) drug therapy: Secondary | ICD-10-CM | POA: Diagnosis not present

## 2018-08-20 DIAGNOSIS — K3580 Unspecified acute appendicitis: Secondary | ICD-10-CM | POA: Diagnosis not present

## 2018-08-20 DIAGNOSIS — R109 Unspecified abdominal pain: Secondary | ICD-10-CM | POA: Diagnosis present

## 2018-08-20 DIAGNOSIS — Z1159 Encounter for screening for other viral diseases: Secondary | ICD-10-CM | POA: Diagnosis not present

## 2018-08-20 HISTORY — PX: LAPAROSCOPIC APPENDECTOMY: SHX408

## 2018-08-20 LAB — URINALYSIS, COMPLETE (UACMP) WITH MICROSCOPIC
Bacteria, UA: NONE SEEN
Bilirubin Urine: NEGATIVE
Glucose, UA: NEGATIVE mg/dL
Hgb urine dipstick: NEGATIVE
Ketones, ur: 5 mg/dL — AB
Leukocytes,Ua: NEGATIVE
Nitrite: NEGATIVE
Protein, ur: NEGATIVE mg/dL
Specific Gravity, Urine: 1.016 (ref 1.005–1.030)
pH: 5 (ref 5.0–8.0)

## 2018-08-20 LAB — HEPATIC FUNCTION PANEL
ALT: 14 U/L (ref 0–44)
AST: 16 U/L (ref 15–41)
Albumin: 4 g/dL (ref 3.5–5.0)
Alkaline Phosphatase: 82 U/L (ref 38–126)
Bilirubin, Direct: 0.1 mg/dL (ref 0.0–0.2)
Indirect Bilirubin: 0.3 mg/dL (ref 0.3–0.9)
Total Bilirubin: 0.4 mg/dL (ref 0.3–1.2)
Total Protein: 6.9 g/dL (ref 6.5–8.1)

## 2018-08-20 LAB — CBC WITH DIFFERENTIAL/PLATELET
Abs Immature Granulocytes: 0.11 10*3/uL — ABNORMAL HIGH (ref 0.00–0.07)
Basophils Absolute: 0.1 10*3/uL (ref 0.0–0.1)
Basophils Relative: 0 %
Eosinophils Absolute: 0.4 10*3/uL (ref 0.0–0.5)
Eosinophils Relative: 2 %
HCT: 39.7 % (ref 36.0–46.0)
Hemoglobin: 12.7 g/dL (ref 12.0–15.0)
Immature Granulocytes: 1 %
Lymphocytes Relative: 14 %
Lymphs Abs: 2.5 10*3/uL (ref 0.7–4.0)
MCH: 29 pg (ref 26.0–34.0)
MCHC: 32 g/dL (ref 30.0–36.0)
MCV: 90.6 fL (ref 80.0–100.0)
Monocytes Absolute: 0.6 10*3/uL (ref 0.1–1.0)
Monocytes Relative: 4 %
Neutro Abs: 13.7 10*3/uL — ABNORMAL HIGH (ref 1.7–7.7)
Neutrophils Relative %: 79 %
Platelets: 323 10*3/uL (ref 150–400)
RBC: 4.38 MIL/uL (ref 3.87–5.11)
RDW: 14.4 % (ref 11.5–15.5)
WBC: 17.3 10*3/uL — ABNORMAL HIGH (ref 4.0–10.5)
nRBC: 0 % (ref 0.0–0.2)

## 2018-08-20 LAB — BASIC METABOLIC PANEL
Anion gap: 10 (ref 5–15)
BUN: 16 mg/dL (ref 6–20)
CO2: 22 mmol/L (ref 22–32)
Calcium: 8.8 mg/dL — ABNORMAL LOW (ref 8.9–10.3)
Chloride: 107 mmol/L (ref 98–111)
Creatinine, Ser: 0.82 mg/dL (ref 0.44–1.00)
GFR calc Af Amer: >60 mL/min (ref >60)
GFR calc non Af Amer: >60 mL/min (ref >60)
Glucose, Bld: 113 mg/dL — ABNORMAL HIGH (ref 70–99)
Potassium: 3.6 mmol/L (ref 3.5–5.1)
Sodium: 139 mmol/L (ref 135–145)

## 2018-08-20 LAB — SARS CORONAVIRUS 2 BY RT PCR (HOSPITAL ORDER, PERFORMED IN ~~LOC~~ HOSPITAL LAB): SARS Coronavirus 2: NEGATIVE

## 2018-08-20 LAB — LIPASE, BLOOD: Lipase: 36 U/L (ref 11–51)

## 2018-08-20 LAB — TROPONIN I: Troponin I: <0.03 ng/mL (ref <0.03)

## 2018-08-20 SURGERY — APPENDECTOMY, LAPAROSCOPIC
Anesthesia: General

## 2018-08-20 MED ORDER — LIDOCAINE-EPINEPHRINE (PF) 1 %-1:200000 IJ SOLN
INTRAMUSCULAR | Status: AC
  Filled 2018-08-20: qty 30

## 2018-08-20 MED ORDER — SUCCINYLCHOLINE CHLORIDE 20 MG/ML IJ SOLN
INTRAMUSCULAR | Status: DC | PRN
  Administered 2018-08-20: 120 mg via INTRAVENOUS

## 2018-08-20 MED ORDER — HYDROMORPHONE HCL 1 MG/ML IJ SOLN
0.2500 mg | INTRAMUSCULAR | Status: AC | PRN
  Administered 2018-08-20 (×7): 0.25 mg via INTRAVENOUS
  Administered 2018-08-20: 0.5 mg via INTRAVENOUS

## 2018-08-20 MED ORDER — PROPOFOL 10 MG/ML IV BOLUS
INTRAVENOUS | Status: AC
  Filled 2018-08-20: qty 20

## 2018-08-20 MED ORDER — ONDANSETRON HCL 4 MG/2ML IJ SOLN: 4.0000 mg | Freq: Four times a day (QID) | INTRAMUSCULAR | Status: DC | PRN

## 2018-08-20 MED ORDER — PROPOFOL 10 MG/ML IV BOLUS
INTRAVENOUS | Status: DC | PRN
  Administered 2018-08-20: 140 mg via INTRAVENOUS
  Administered 2018-08-20: 30 mg via INTRAVENOUS

## 2018-08-20 MED ORDER — MIDAZOLAM HCL 2 MG/2ML IJ SOLN
INTRAMUSCULAR | Status: DC | PRN
  Administered 2018-08-20: 2 mg via INTRAVENOUS

## 2018-08-20 MED ORDER — METOPROLOL SUCCINATE ER 25 MG PO TB24
25.0000 mg | ORAL_TABLET | Freq: Every day | ORAL | Status: DC
  Administered 2018-08-20: 16:00:00 25 mg via ORAL
  Filled 2018-08-20 (×2): qty 1

## 2018-08-20 MED ORDER — ONDANSETRON HCL 4 MG/2ML IJ SOLN
4.0000 mg | Freq: Once | INTRAMUSCULAR | Status: AC
  Administered 2018-08-20: 4 mg via INTRAVENOUS
  Filled 2018-08-20: qty 2

## 2018-08-20 MED ORDER — IBUPROFEN 400 MG PO TABS: 600.0000 mg | ORAL_TABLET | Freq: Four times a day (QID) | ORAL | Status: DC | PRN

## 2018-08-20 MED ORDER — PROMETHAZINE HCL 25 MG/ML IJ SOLN: 6.2500 mg | INTRAMUSCULAR | Status: DC | PRN

## 2018-08-20 MED ORDER — BUPROPION HCL ER (XL) 150 MG PO TB24
300.0000 mg | ORAL_TABLET | Freq: Every day | ORAL | Status: DC
  Administered 2018-08-21: 300 mg via ORAL
  Filled 2018-08-20: qty 1
  Filled 2018-08-20: qty 2

## 2018-08-20 MED ORDER — KETOROLAC TROMETHAMINE 30 MG/ML IJ SOLN: 30.0000 mg | Freq: Four times a day (QID) | INTRAMUSCULAR | Status: DC | PRN

## 2018-08-20 MED ORDER — METRONIDAZOLE IN NACL 5-0.79 MG/ML-% IV SOLN
500.0000 mg | Freq: Three times a day (TID) | INTRAVENOUS | Status: DC
  Administered 2018-08-21 (×2): 500 mg via INTRAVENOUS
  Filled 2018-08-20 (×6): qty 100

## 2018-08-20 MED ORDER — METRONIDAZOLE IN NACL 5-0.79 MG/ML-% IV SOLN
500.0000 mg | Freq: Once | INTRAVENOUS | Status: AC
  Administered 2018-08-20: 13:00:00 500 mg via INTRAVENOUS
  Filled 2018-08-20: qty 100

## 2018-08-20 MED ORDER — LIDOCAINE HCL (CARDIAC) PF 100 MG/5ML IV SOSY
PREFILLED_SYRINGE | INTRAVENOUS | Status: DC | PRN
  Administered 2018-08-20: 80 mg via INTRAVENOUS

## 2018-08-20 MED ORDER — SUGAMMADEX SODIUM 200 MG/2ML IV SOLN
INTRAVENOUS | Status: AC
  Filled 2018-08-20: qty 2

## 2018-08-20 MED ORDER — DEXAMETHASONE SODIUM PHOSPHATE 10 MG/ML IJ SOLN
INTRAMUSCULAR | Status: AC
  Filled 2018-08-20: qty 1

## 2018-08-20 MED ORDER — HYDROMORPHONE HCL 1 MG/ML IJ SOLN
1.0000 mg | Freq: Once | INTRAMUSCULAR | Status: AC
  Administered 2018-08-20: 1 mg via INTRAVENOUS
  Filled 2018-08-20: qty 1

## 2018-08-20 MED ORDER — LIDOCAINE-EPINEPHRINE (PF) 1 %-1:200000 IJ SOLN
INTRAMUSCULAR | Status: DC | PRN
  Administered 2018-08-20: 18 mL

## 2018-08-20 MED ORDER — SODIUM CHLORIDE 0.9 % IV BOLUS
1000.0000 mL | Freq: Once | INTRAVENOUS | Status: AC
  Administered 2018-08-20: 12:00:00 1000 mL via INTRAVENOUS

## 2018-08-20 MED ORDER — ROCURONIUM BROMIDE 50 MG/5ML IV SOLN
INTRAVENOUS | Status: AC
  Filled 2018-08-20: qty 1

## 2018-08-20 MED ORDER — CIPROFLOXACIN IN D5W 400 MG/200ML IV SOLN
400.0000 mg | Freq: Two times a day (BID) | INTRAVENOUS | Status: DC
  Administered 2018-08-20 – 2018-08-21 (×3): 400 mg via INTRAVENOUS
  Filled 2018-08-20 (×5): qty 200

## 2018-08-20 MED ORDER — ACETAMINOPHEN 10 MG/ML IV SOLN
INTRAVENOUS | Status: AC
  Filled 2018-08-20: qty 100

## 2018-08-20 MED ORDER — SUGAMMADEX SODIUM 500 MG/5ML IV SOLN
INTRAVENOUS | Status: DC | PRN
  Administered 2018-08-20: 199.6 mg via INTRAVENOUS

## 2018-08-20 MED ORDER — FENTANYL CITRATE (PF) 100 MCG/2ML IJ SOLN
INTRAMUSCULAR | Status: AC
  Filled 2018-08-20: qty 2

## 2018-08-20 MED ORDER — LACTATED RINGERS IV SOLN
INTRAVENOUS | Status: DC
  Administered 2018-08-20 – 2018-08-21 (×3): via INTRAVENOUS

## 2018-08-20 MED ORDER — MORPHINE SULFATE (PF) 4 MG/ML IV SOLN
4.0000 mg | Freq: Once | INTRAVENOUS | Status: AC
  Administered 2018-08-20: 12:00:00 4 mg via INTRAVENOUS
  Filled 2018-08-20: qty 1

## 2018-08-20 MED ORDER — ROCURONIUM BROMIDE 100 MG/10ML IV SOLN
INTRAVENOUS | Status: DC | PRN
  Administered 2018-08-20: 40 mg via INTRAVENOUS

## 2018-08-20 MED ORDER — IOHEXOL 300 MG/ML  SOLN
100.0000 mL | Freq: Once | INTRAMUSCULAR | Status: AC | PRN
  Administered 2018-08-20: 13:00:00 100 mL via INTRAVENOUS
  Filled 2018-08-20: qty 100

## 2018-08-20 MED ORDER — HYDROMORPHONE HCL 1 MG/ML IJ SOLN
INTRAMUSCULAR | Status: AC
  Filled 2018-08-20: qty 1

## 2018-08-20 MED ORDER — BUPIVACAINE HCL (PF) 0.25 % IJ SOLN
INTRAMUSCULAR | Status: AC
  Filled 2018-08-20: qty 30

## 2018-08-20 MED ORDER — ONDANSETRON 4 MG PO TBDP: 4.0000 mg | ORAL_TABLET | Freq: Four times a day (QID) | ORAL | Status: DC | PRN

## 2018-08-20 MED ORDER — MIDAZOLAM HCL 2 MG/2ML IJ SOLN
INTRAMUSCULAR | Status: AC
  Filled 2018-08-20: qty 2

## 2018-08-20 MED ORDER — ACETAMINOPHEN 10 MG/ML IV SOLN
INTRAVENOUS | Status: DC | PRN
  Administered 2018-08-20: 1000 mg via INTRAVENOUS

## 2018-08-20 MED ORDER — ACETAMINOPHEN 500 MG PO TABS
1000.0000 mg | ORAL_TABLET | Freq: Four times a day (QID) | ORAL | Status: DC
  Administered 2018-08-20 – 2018-08-21 (×3): 1000 mg via ORAL
  Filled 2018-08-20 (×3): qty 2

## 2018-08-20 MED ORDER — DEXAMETHASONE SODIUM PHOSPHATE 10 MG/ML IJ SOLN
INTRAMUSCULAR | Status: DC | PRN
  Administered 2018-08-20: 10 mg via INTRAVENOUS

## 2018-08-20 MED ORDER — HYDROMORPHONE HCL 1 MG/ML IJ SOLN
0.5000 mg | INTRAMUSCULAR | Status: DC | PRN
  Administered 2018-08-20: 15:00:00 0.5 mg via INTRAVENOUS
  Filled 2018-08-20: qty 1

## 2018-08-20 MED ORDER — FENTANYL CITRATE (PF) 100 MCG/2ML IJ SOLN
INTRAMUSCULAR | Status: DC | PRN
  Administered 2018-08-20 (×2): 50 ug via INTRAVENOUS

## 2018-08-20 MED ORDER — ONDANSETRON HCL 4 MG/2ML IJ SOLN
INTRAMUSCULAR | Status: DC | PRN
  Administered 2018-08-20: 4 mg via INTRAVENOUS

## 2018-08-20 MED ORDER — SUCCINYLCHOLINE CHLORIDE 20 MG/ML IJ SOLN
INTRAMUSCULAR | Status: AC
  Filled 2018-08-20: qty 1

## 2018-08-20 SURGICAL SUPPLY — 52 items
APPLICATOR COTTON TIP 6 STRL (MISCELLANEOUS) ×1 IMPLANT
APPLICATOR COTTON TIP 6IN STRL (MISCELLANEOUS) ×2
APPLIER CLIP 5 13 M/L LIGAMAX5 (MISCELLANEOUS)
BLADE SURG SZ11 CARB STEEL (BLADE) ×2 IMPLANT
CANISTER SUCT 1200ML W/VALVE (MISCELLANEOUS) ×2 IMPLANT
CHLORAPREP W/TINT 26 (MISCELLANEOUS) ×2 IMPLANT
CLIP APPLIE 5 13 M/L LIGAMAX5 (MISCELLANEOUS) IMPLANT
COVER WAND RF STERILE (DRAPES) ×2 IMPLANT
CUTTER FLEX LINEAR 45M (STAPLE) ×2 IMPLANT
DEFOGGER SCOPE WARMER CLEARIFY (MISCELLANEOUS) ×2 IMPLANT
DERMABOND ADVANCED (GAUZE/BANDAGES/DRESSINGS) ×1
DERMABOND ADVANCED .7 DNX12 (GAUZE/BANDAGES/DRESSINGS) ×1 IMPLANT
ELECT CAUTERY BLADE 6.4 (BLADE) ×2 IMPLANT
ELECT CAUTERY BLADE TIP 2.5 (TIP) ×2
ELECT REM PT RETURN 9FT ADLT (ELECTROSURGICAL) ×2
ELECTRODE CAUTERY BLDE TIP 2.5 (TIP) ×1 IMPLANT
ELECTRODE REM PT RTRN 9FT ADLT (ELECTROSURGICAL) ×1 IMPLANT
GLOVE BIO SURGEON STRL SZ 6.5 (GLOVE) ×2 IMPLANT
GLOVE INDICATOR 7.0 STRL GRN (GLOVE) ×4 IMPLANT
GOWN STRL REUS W/ TWL LRG LVL3 (GOWN DISPOSABLE) ×2 IMPLANT
GOWN STRL REUS W/TWL LRG LVL3 (GOWN DISPOSABLE) ×2
GRASPER SUT TROCAR 14GX15 (MISCELLANEOUS) ×2 IMPLANT
IRRIGATION STRYKERFLOW (MISCELLANEOUS) ×1 IMPLANT
IRRIGATOR STRYKERFLOW (MISCELLANEOUS) ×2
IV NS 1000ML (IV SOLUTION) ×1
IV NS 1000ML BAXH (IV SOLUTION) ×1 IMPLANT
KIT TURNOVER KIT A (KITS) ×2 IMPLANT
KITTNER LAPARASCOPIC 5X40 (MISCELLANEOUS) ×2 IMPLANT
LABEL OR SOLS (LABEL) ×2 IMPLANT
LIGASURE LAP MARYLAND 5MM 37CM (ELECTROSURGICAL) IMPLANT
NEEDLE HYPO 22GX1.5 SAFETY (NEEDLE) ×2 IMPLANT
NS IRRIG 500ML POUR BTL (IV SOLUTION) ×2 IMPLANT
PACK LAP CHOLECYSTECTOMY (MISCELLANEOUS) ×2 IMPLANT
PENCIL ELECTRO HAND CTR (MISCELLANEOUS) ×2 IMPLANT
POUCH SPECIMEN RETRIEVAL 10MM (ENDOMECHANICALS) ×2 IMPLANT
RELOAD 45 VASCULAR/THIN (ENDOMECHANICALS) IMPLANT
RELOAD STAPLE TA45 3.5 REG BLU (ENDOMECHANICALS) ×2 IMPLANT
SCISSORS METZENBAUM CVD 33 (INSTRUMENTS) ×2 IMPLANT
SET TUBE SMOKE EVAC HIGH FLOW (TUBING) ×2 IMPLANT
SHEARS HARMONIC ACE PLUS 36CM (ENDOMECHANICALS) ×2 IMPLANT
SLEEVE ADV FIXATION 5X100MM (TROCAR) ×4 IMPLANT
STRIP CLOSURE SKIN 1/2X4 (GAUZE/BANDAGES/DRESSINGS) ×2 IMPLANT
SUT MNCRL 4-0 (SUTURE) ×1
SUT MNCRL 4-0 27XMFL (SUTURE) ×1
SUT VIC AB 3-0 SH 27 (SUTURE) ×1
SUT VIC AB 3-0 SH 27X BRD (SUTURE) ×1 IMPLANT
SUT VICRYL 0 AB UR-6 (SUTURE) ×2 IMPLANT
SUTURE MNCRL 4-0 27XMF (SUTURE) ×1 IMPLANT
TRAY FOLEY MTR SLVR 16FR STAT (SET/KITS/TRAYS/PACK) ×2 IMPLANT
TROCAR 130MM GELPORT  DAV (MISCELLANEOUS) ×2 IMPLANT
TROCAR ADV FIXATION 12X100MM (TROCAR) IMPLANT
TROCAR Z-THREAD OPTICAL 5X100M (TROCAR) ×2 IMPLANT

## 2018-08-20 NOTE — Transfer of Care (Signed)
Immediate Anesthesia Transfer of Care Note  Patient: Susan Snyder  Procedure(s) Performed: APPENDECTOMY LAPAROSCOPIC (N/A )  Patient Location: PACU  Anesthesia Type:General  Level of Consciousness: awake and alert   Airway & Oxygen Therapy: Patient Spontanous Breathing and Patient connected to face mask oxygen  Post-op Assessment: Report given to RN and Post -op Vital signs reviewed and stable  Post vital signs: Reviewed and stable  Last Vitals:  Vitals Value Taken Time  BP 130/89 08/20/2018  7:23 PM  Temp    Pulse 105 08/20/2018  7:26 PM  Resp 19 08/20/2018  7:26 PM  SpO2 100 % 08/20/2018  7:26 PM  Vitals shown include unvalidated device data.  Last Pain:  Vitals:   08/20/18 1557  TempSrc: Oral  PainSc:          Complications: No apparent anesthesia complications

## 2018-08-20 NOTE — OR Nursing (Signed)
Dr. Cherylann Banas titled his H&P as a progress note when it is a H&P

## 2018-08-20 NOTE — ED Notes (Signed)
ED TO INPATIENT HANDOFF REPORT  ED Nurse Name and Phone #: Anderson Malta [phone]  S Name/Age/Gender Susan Snyder 53 y.o. female Room/Bed: ED32A/ED32A  Code Status   Code Status: Full Code  Home/SNF/Other Home Patient oriented to: self, place, time and situation Is this baseline? Yes   Triage Complete: Triage complete  Chief Complaint v/d  Triage Note Pt c/o abd pain, SOB, body aches with N/V/D since 4am this morning I thought it was something I ate.   Allergies Allergies  Allergen Reactions  . Adhesive [Tape]   . Gabapentin Nausea Only    Higher Dose causes vomiting Higher Dose causes vomiting  . Keflex [Cephalexin]   . Penicillins   . Sulfa Antibiotics     Level of Care/Admitting Diagnosis ED Disposition    ED Disposition Condition [facility] Area: [place] [100120]  Level of Care: Med-Surg [16]  Covid Evaluation: Screening Protocol (No Symptoms)  Diagnosis: Acute appendicitis [623762]  Admitting Physician: Darreld Mclean  Attending Physician: Fredirick Maudlin [[id number]]  PT Class (Do Not Modify): Observation [104]  PT Acc Code (Do Not Modify): Observation [10022]       B Medical/Surgery History Past Medical History:  Diagnosis Date  . Chronic diarrhea   . Chronic gastritis Sept. 2015   on EGD   . Collagenous colitis    colonoscopy biopsies 11/01/13  . Hyperplastic polyps of stomach    Aug 2015  . Hypertension   . Neoplasm of uncertain behavior of skin 05/20/2016   Hx of skin cancer, BCC  . Renal cyst    Past Surgical History:  Procedure Laterality Date  . ABDOMINAL HYSTERECTOMY  2005   partial, still has ovaries, due to fibroids.  Marland Kitchen [place]   x 4  . COLONOSCOPY W/ BIOPSIES  11/01/13   collagenous colitis, sigmoid polypectomy x 2; hyperplastic polyps  . ESOPHAGOGASTRODUODENOSCOPY  11/22/13   chronic gastritis  . HIP ARTHROPLASTY Bilateral 01/15/14,03/05/14   total hip, Dr.  Nilsa Nutting  . NECK SURGERY  2015     A IV Location/Drains/Wounds Patient Lines/Drains/Airways Status   Active Line/Drains/Airways    Name:   Placement date:   Placement time:   Site:   Days:   Peripheral IV 08/20/18 Right Antecubital   08/20/18    1143    Antecubital   less than 1          Intake/Output Last 24 hours No intake or output data in the 24 hours ending 08/20/18 1513  Labs/Imaging Results for orders placed or performed during the hospital encounter of 08/20/18 (from the past 48 hour(s))  Urinalysis, Complete w Microscopic     Status: Abnormal   Collection Time: 08/20/18 11:11 AM  Result Value Ref Range   Color, Urine YELLOW (A) YELLOW   APPearance HAZY (A) CLEAR   Specific Gravity, Urine 1.016 1.005 - 1.030   pH 5.0 5.0 - 8.0   Glucose, UA NEGATIVE NEGATIVE mg/dL   Hgb urine dipstick NEGATIVE NEGATIVE   Bilirubin Urine NEGATIVE NEGATIVE   Ketones, ur 5 (A) NEGATIVE mg/dL   Protein, ur NEGATIVE NEGATIVE mg/dL   Nitrite NEGATIVE NEGATIVE   Leukocytes,Ua NEGATIVE NEGATIVE   RBC / HPF 0-5 0 - 5 RBC/hpf   WBC, UA 0-5 0 - 5 WBC/hpf   Bacteria, UA NONE SEEN NONE SEEN   Squamous Epithelial / LPF 0-5 0 - 5   Mucus PRESENT    Hyaline Casts, UA  PRESENT     Comment: Performed at [facility], [address] [place], [place] [address]  Basic metabolic panel     Status: Abnormal   Collection Time: 08/20/18 11:45 AM  Result Value Ref Range   Sodium 139 135 - 145 mmol/L   Potassium 3.6 3.5 - 5.1 mmol/L   Chloride 107 98 - 111 mmol/L   CO2 22 22 - 32 mmol/L   Glucose, Bld 113 (H) 70 - 99 mg/dL   BUN 16 6 - 20 mg/dL   Creatinine, Ser 0.82 0.44 - 1.00 mg/dL   Calcium 8.8 (L) 8.9 - 10.3 mg/dL   GFR calc non Af Amer >60 >60 mL/min   GFR calc Af Amer >60 >60 mL/min   Anion gap 10 5 - 15    Comment: Performed at [facility], [address], [place], [place] [address]  Hepatic function panel     Status: None   Collection Time: 08/20/18 11:45 AM   Result Value Ref Range   Total Protein 6.9 6.5 - 8.1 g/dL   Albumin 4.0 3.5 - 5.0 g/dL   AST 16 15 - 41 U/L   ALT 14 0 - 44 U/L   Alkaline Phosphatase 82 38 - 126 U/L   Total Bilirubin 0.4 0.3 - 1.2 mg/dL   Bilirubin, Direct 0.1 0.0 - 0.2 mg/dL   Indirect Bilirubin 0.3 0.3 - 0.9 mg/dL    Comment: Performed at [facility], [address] [place], [place] [address]  Lipase, blood     Status: None   Collection Time: 08/20/18 11:45 AM  Result Value Ref Range   Lipase 36 11 - 51 U/L    Comment: Performed at [facility], [address] [place], [place] [address]  CBC with Differential     Status: Abnormal   Collection Time: 08/20/18 11:45 AM  Result Value Ref Range   WBC 17.3 (H) 4.0 - 10.5 K/uL   RBC 4.38 3.87 - 5.11 MIL/uL   Hemoglobin 12.7 12.0 - 15.0 g/dL   HCT 39.7 36.0 - 46.0 %   MCV 90.6 80.0 - 100.0 fL   MCH 29.0 26.0 - 34.0 pg   MCHC 32.0 30.0 - 36.0 g/dL   RDW 14.4 11.5 - 15.5 %   Platelets 323 150 - 400 K/uL   nRBC 0.0 0.0 - 0.2 %   Neutrophils Relative % 79 %   Neutro Abs 13.7 (H) 1.7 - 7.7 K/uL   Lymphocytes Relative 14 %   Lymphs Abs 2.5 0.7 - 4.0 K/uL   Monocytes Relative 4 %   Monocytes Absolute 0.6 0.1 - 1.0 K/uL   Eosinophils Relative 2 %   Eosinophils Absolute 0.4 0.0 - 0.5 K/uL   Basophils Relative 0 %   Basophils Absolute 0.1 0.0 - 0.1 K/uL   Immature Granulocytes 1 %   Abs Immature Granulocytes 0.11 (H) 0.00 - 0.07 K/uL    Comment: Performed at [facility], [address] [place], [place] [address]  Troponin I - Once     Status: None   Collection Time: 08/20/18 11:45 AM  Result Value Ref Range   Troponin I <0.03 <0.03 ng/mL    Comment: Performed at [facility], [address], [place], [place] [address]  SARS Coronavirus 2 (CEPHEID - Performed in [place] hospital lab), [facility] Order     Status: None   Collection Time: 08/20/18  1:25 PM  Result Value Ref Range   SARS Coronavirus  2 NEGATIVE NEGATIVE     Comment: (NOTE) If result is NEGATIVE SARS-CoV-2 target nucleic acids are NOT DETECTED. The SARS-CoV-2 RNA is generally detectable in upper and lower  respiratory specimens during the acute phase of infection. The lowest  concentration of SARS-CoV-2 viral copies this assay can detect is 250  copies / mL. A negative result does not preclude SARS-CoV-2 infection  and should not be used as the sole basis for treatment or other  patient management decisions.  A negative result [place] occur with  improper specimen collection / handling, submission of specimen other  than nasopharyngeal swab, presence of viral mutation(s) within the  areas targeted by this assay, and inadequate number of viral copies  (<250 copies / mL). A negative result must be combined with clinical  observations, patient history, and epidemiological information. If result is POSITIVE SARS-CoV-2 target nucleic acids are DETECTED. The SARS-CoV-2 RNA is generally detectable in upper and lower  respiratory specimens dur ing the acute phase of infection.  Positive  results are indicative of active infection with SARS-CoV-2.  Clinical  correlation with patient history and other diagnostic information is  necessary to determine patient infection status.  Positive results do  not rule out bacterial infection or co-infection with other viruses. If result is PRESUMPTIVE POSTIVE SARS-CoV-2 nucleic acids [place] BE PRESENT.   A presumptive positive result was obtained on the submitted specimen  and confirmed on repeat testing.  While 2019 novel coronavirus  (SARS-CoV-2) nucleic acids [place] be present in the submitted sample  additional confirmatory testing [place] be necessary for epidemiological  and / or clinical management purposes  to differentiate between  SARS-CoV-2 and other Sarbecovirus currently known to infect humans.  If clinically indicated additional testing with an alternate test  methodology [phone]) is advised. The SARS-CoV-2  RNA is generally  detectable in upper and lower respiratory sp ecimens during the acute  phase of infection. The expected result is Negative. Fact Sheet for Patients:  StrictlyIdeas.no Fact Sheet for Healthcare Providers: BankingDealers.co.za This test is not yet approved or cleared by the [place] FDA and has been authorized for detection and/or diagnosis of SARS-CoV-2 by FDA under an Emergency Use Authorization (EUA).  This EUA will remain in effect (meaning this test can be used) for the duration of the COVID-19 declaration under Section 564(b)(1) of the Act, 21 U.S.C. section 360bbb-3(b)(1), unless the authorization is terminated or revoked sooner. Performed at [facility], [address] [place], [place] [address]    Ct Abdomen Pelvis W Contrast  Result Date: 08/20/2018 CLINICAL DATA:  Abdominal pain EXAM: CT ABDOMEN AND PELVIS WITH CONTRAST TECHNIQUE: Multidetector CT imaging of the abdomen and pelvis was performed using the standard protocol following bolus administration of intravenous contrast. CONTRAST:  146m OMNIPAQUE IOHEXOL 300 MG/ML  SOLN COMPARISON:  None. FINDINGS: Lower chest: No acute abnormality. Hepatobiliary: Single lamellated gallstone is present. There is no evidence of inflammatory change. Tiny hypodensity in the inferior tip of the right lobe of the liver is noted on image 46. Pancreas: Unremarkable Spleen: Unremarkable Adrenals/Urinary Tract: Adrenal glands are within normal limits. Left kidney is unremarkable or. Simple cysts in the lower pole of the right kidney. Bladder is decompressed. Stomach/Bowel: The appendix is distended. There are inflammatory changes in the adjacent fat. And appendicoliths is present at the base of the appendix. These findings are consistent with acute appendicitis. There is no evidence of external bowel gas to suggest rupture. No abscess formation. Small bowel is decompressed.  Small hiatal hernia is suspected. Vascular/Lymphatic: No evidence of aortic aneurysm. Atherosclerotic vascular calcifications are noted. No abnormal retroperitoneal adenopathy. Small inflammatory nodes in the cecal mesentery. Reproductive: Uterus is absent. Other: No free fluid. Musculoskeletal: No vertebral compression deformity. Degenerative disc disease in the mid and lower lumbar spine. IMPRESSION: Acute appendicitis. Critical Value/emergent results were called by telephone at the time of interpretation on 08/20/2018 at 12:56 pm to Dr. Arta Silence , who verbally acknowledged these results. Cholelithiasis. Electronically Signed   By: Marybelle Killings M.D.   On: 08/20/2018 12:57    Pending Labs Unresulted Labs (From admission, onward)    Start     Ordered   08/21/18 8250  Basic metabolic panel  Tomorrow morning,   STAT     08/20/18 1313   08/21/18 0500  CBC  Tomorrow morning,   STAT     08/20/18 1313          Vitals/Pain Today's Vitals   08/20/18 1056 08/20/18 1057 08/20/18 1323  BP: 127/80  (!) 154/81  Pulse: 94  86  Resp: 17  18  Temp:  98.3 F (36.8 C)   TempSrc: Oral    SpO2: 99%  100%  Weight: 99.8 kg    Height: 5' 8"  (1.727 m)    PainSc: 10-Worst pain ever      Isolation Precautions No active isolations  Medications Medications  lactated ringers infusion (has no administration in time range)  acetaminophen (TYLENOL) tablet 1,000 mg (has no administration in time range)  ketorolac (TORADOL) 30 MG/ML injection 30 mg (has no administration in time range)  ibuprofen (ADVIL) tablet 600 mg (has no administration in time range)  HYDROmorphone (DILAUDID) injection 0.5 mg (has no administration in time range)  ondansetron (ZOFRAN-ODT) disintegrating tablet 4 mg (has no administration in time range)    Or  ondansetron (ZOFRAN) injection 4 mg (has no administration in time range)  ciprofloxacin (CIPRO) IVPB 400 mg (has no administration in time range)  metroNIDAZOLE  (FLAGYL) IVPB 500 mg (has no administration in time range)  buPROPion (WELLBUTRIN XL) 24 hr tablet 300 mg (has no administration in time range)  metoprolol succinate (TOPROL-XL) 24 hr tablet 25 mg (has no administration in time range)  sodium chloride 0.9 % bolus 1,000 mL (0 mLs Intravenous Stopped 08/20/18 1309)  morphine 4 MG/ML injection 4 mg (4 mg Intravenous Given 08/20/18 1144)  ondansetron (ZOFRAN) injection 4 mg (4 mg Intravenous Given 08/20/18 1144)  iohexol (OMNIPAQUE) 300 MG/ML solution 100 mL (100 mLs Intravenous Contrast Given 08/20/18 1237)  metroNIDAZOLE (FLAGYL) IVPB 500 mg (0 mg Intravenous Stopped 08/20/18 1513)  HYDROmorphone (DILAUDID) injection 1 mg (1 mg Intravenous Given 08/20/18 1315)    Mobility walks Low fall risk   Focused Assessments Pulmonary Assessment Handoff:  Lung sounds:   O2 Device: Room Air        R Recommendations: See Admitting Provider Note  Report given to:   Additional Notes:

## 2018-08-20 NOTE — ED Triage Notes (Signed)
Pt c/o abd pain, SOB, body aches with N/V/D since 4am this morning I thought it was something I ate.

## 2018-08-20 NOTE — ED Provider Notes (Signed)
[facility] Emergency Department Provider Note ____________________________________________   First MD Initiated Contact with Patient 08/20/18 1103     (approximate)  I have reviewed the triage vital signs and the nursing notes.   HISTORY  Chief Complaint Abdominal Pain    HPI Susan Snyder is a 53 y.o. female with PMH as noted below including collagenous colitis who presents with abdominal pain, acute onset this morning around 4 AM, persistent since then, mainly in the epigastric region and central abdomen, and associated initially with diarrhea and now with nausea and vomiting.  The patient also reports some body aches.  She states that the pain makes her feel short of breath at times, but otherwise does not feel short of breath independent of the pain.  She has no fever or cough.  She denies any blood in the stool.  She was recently treated for UTI but states the symptoms resolved.  Past Medical History:  Diagnosis Date   Chronic diarrhea    Chronic gastritis Sept. 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   Vitamin B12 deficiency 01/14/2015   Vitamin D deficiency 01/14/2015   Obesity 01/14/2015   Hx of iron deficiency anemia 01/14/2015   Frequent headaches 12/28/2014   Abnormal finding on MRI of brain 12/28/2014   Tobacco abuse 12/23/2014   Stress reaction 12/23/2014   Hypertension    Renal cyst    Collagenous colitis    H/O bilateral hip replacements 06/28/2014   Celiac disease 01/02/2014   Primary localized osteoarthrosis, pelvic  region and thigh 10/25/2013   Cervical spondylosis 02/06/2013    Past Surgical History:  Procedure Laterality Date   ABDOMINAL HYSTERECTOMY  2005   partial, still has ovaries, due to fibroids.   [place]   x 4   COLONOSCOPY W/ BIOPSIES  11/01/13   collagenous colitis, sigmoid polypectomy x 2; hyperplastic polyps   ESOPHAGOGASTRODUODENOSCOPY  11/22/13   chronic gastritis   HIP ARTHROPLASTY Bilateral 01/15/14,03/05/14   total hip, Dr. Nilsa Nutting   NECK SURGERY  2015    Prior to Admission medications   Medication Sig Start Date End Date Taking? Authorizing Provider  aspirin EC 81 MG tablet Take 1 tablet (81 mg total) by mouth daily. 04/13/18   Arnetha Courser, MD  atorvastatin (LIPITOR) 10 MG tablet Take 1 tablet (10 mg total) by mouth at bedtime. 10/28/17   Arnetha Courser, MD  buPROPion (WELLBUTRIN XL) 300 MG 24 hr tablet Take 1 tablet (300 mg total) by mouth daily. 08/25/17   Arnetha Courser, MD  ciprofloxacin (CIPRO) 500 MG tablet Take 1 tablet (500 mg total) by mouth 2 (two) times daily. Patient not taking: Reported on 08/20/2018 07/07/18   Fredderick Severance, NP  loperamide (IMODIUM) 2 MG capsule Take 2 mg by mouth as needed.     [provider]  metoprolol succinate (TOPROL-XL) 25 MG 24 hr tablet Take 1 tablet (25 mg total) by mouth daily. 08/25/17   Enid Derry  P, MD  nitrofurantoin, macrocrystal-monohydrate, (MACROBID) 100 MG capsule Take 1 capsule (100 mg total) by mouth 2 (two) times daily. Patient not taking: Reported on 07/07/2018 06/27/18   Fredderick Severance, NP  Vitamin D, Cholecalciferol, 25 MCG (1000 UT) TABS Take 1 tablet by mouth daily.    [provider]  Vitamin D, Ergocalciferol, (DRISDOL) 1.25 MG (50000 UT) CAPS capsule Take 1 capsule (50,000 Units total) by mouth every 7 (seven) days. Patient not taking: Reported on 07/07/2018 04/14/18   Arnetha Courser, MD    Allergies Adhesive [tape]; Gabapentin; Keflex [cephalexin];  Penicillins; and Sulfa antibiotics  Family History  Problem Relation Age of Onset   Stroke Mother    Glaucoma Mother    Heart disease Father    Cancer Father        prostate   Heart disease Sister    Diabetes Sister    Diabetes Paternal Aunt    Heart disease Paternal Aunt    Bleeding Disorder Paternal Uncle    Heart disease Sister        pace maker   Diabetes Paternal Aunt    Heart disease Paternal Aunt     Social History Social History   Tobacco Use   Smoking status: Current Every Day Smoker    Packs/day: 0.50    Types: Cigarettes   Smokeless tobacco: Never Used  Substance Use Topics   Alcohol use: No   Drug use: No    Review of Systems  Constitutional: No fever. Eyes: No redness. ENT: No sore throat. Cardiovascular: Denies chest pain. Respiratory: Positive for shortness of breath. Gastrointestinal: Positive for vomiting and resolved diarrhea Genitourinary: Negative for dysuria.  Musculoskeletal: Negative for back pain. Skin: Negative for rash. Neurological: Negative for headache.   ____________________________________________   PHYSICAL EXAM:  VITAL SIGNS: ED Triage Vitals  Enc Vitals Group     BP 08/20/18 1056 127/80     Pulse Rate 08/20/18 1056 94     Resp 08/20/18 1056 17     Temp 08/20/18 1057 98.3 F (36.8 C)     Temp Source 08/20/18 1056 Oral     SpO2 08/20/18 1056 99 %     Weight 08/20/18 1056 220 lb (99.8 kg)     Height 08/20/18 1056 5' 8"  (1.727 m)     Head Circumference --      Peak Flow --      Pain Score 08/20/18 1056 10     Pain Loc --      Pain Edu? --      Excl. in [place]? --     Constitutional: Alert and oriented.  Uncomfortable appearing but in no acute distress. Eyes: Conjunctivae are normal.  No scleral icterus. Head: Atraumatic. Nose: No congestion/rhinnorhea. Mouth/Throat: Mucous membranes are slightly dry.   Neck: Normal range of motion.  Cardiovascular: Normal rate, regular rhythm. Good peripheral  circulation. Respiratory: Normal respiratory effort.  No retractions.  Gastrointestinal: Soft with moderate epigastric and mild periumbilical tenderness.  No distention.  Genitourinary: No flank tenderness. Musculoskeletal: Extremities warm and well perfused.  Neurologic:  Normal speech and language. No gross focal neurologic deficits are appreciated.  Skin:  Skin is warm and dry. No rash noted. Psychiatric: Mood and affect are normal. Speech and behavior are normal.  ____________________________________________   LABS (all labs ordered are listed, but only abnormal results are displayed)  Labs Reviewed  BASIC METABOLIC PANEL - Abnormal; Notable for the following components:      Result Value  Glucose, Bld 113 (*)    Calcium 8.8 (*)    All other components within normal limits  CBC WITH DIFFERENTIAL/PLATELET - Abnormal; Notable for the following components:   WBC 17.3 (*)    Neutro Abs 13.7 (*)    Abs Immature Granulocytes 0.11 (*)    All other components within normal limits  URINALYSIS, COMPLETE (UACMP) WITH MICROSCOPIC - Abnormal; Notable for the following components:   Color, Urine YELLOW (*)    APPearance HAZY (*)    Ketones, ur 5 (*)    All other components within normal limits  SARS CORONAVIRUS 2 (HOSPITAL ORDER, [facility])  HEPATIC FUNCTION PANEL  LIPASE, BLOOD  TROPONIN I   ____________________________________________  EKG  ED ECG REPORT I, Arta Silence, the attending physician, personally viewed and interpreted this ECG.  Date: 08/20/2018 EKG Time: 1155 Rate: 72 Rhythm: normal sinus rhythm QRS Axis: normal Intervals: normal ST/T Wave abnormalities: normal Narrative Interpretation: no evidence of acute ischemia    ____________________________________________  RADIOLOGY  CT abdomen: Findings consistent with acute appendicitis  ____________________________________________   PROCEDURES  Procedure(s) performed:  No  Procedures  Critical Care performed: No ____________________________________________   INITIAL IMPRESSION / ASSESSMENT AND PLAN / ED COURSE  Pertinent labs & imaging results that were available during my care of the patient were reviewed by me and considered in my medical decision making (see chart for details).  53 year old female with PMH as noted above presents with abdominal pain for approximately the last 6 to 7 hours initially associated with nonbloody diarrhea, but now with nausea and vomiting.  I reviewed the past medical records in [place].  The patient was treated last month for a UTI with Cipro.  She states that the symptoms resolved.  On exam, the patient is uncomfortable but not acutely ill-appearing.  Her vital signs are normal.  The abdomen is soft.  She has epigastric and mid abdominal tenderness.  The remainder of the exam is as described above.  Differential includes gastroenteritis, gastritis, PUD, colitis, diverticulitis, or less likely appendicitis.  I have a low suspicion for hepatobiliary etiology.  We will obtain lab work-up, CT abdomen, and give fluids and medication for symptomatic treatment.  ----------------------------------------- 2:53 PM on 08/20/2018 -----------------------------------------  CT shows findings consistent with acute appendicitis.  I consulted Dr. Celine Ahr from general surgery who will evaluate the patient.  The patient has allergies to cephalosporins and penicillin so I will give Flagyl. ____________________________________________   FINAL CLINICAL IMPRESSION(S) / ED DIAGNOSES  Final diagnoses:  Acute appendicitis, unspecified acute appendicitis type      NEW MEDICATIONS STARTED DURING THIS VISIT:  New Prescriptions   No medications on file     Note:  This document was prepared using Dragon voice recognition software and [place] include unintentional dictation errors.    Arta Silence, MD 08/20/18 1454

## 2018-08-20 NOTE — Anesthesia Post-op Follow-up Note (Signed)
Anesthesia QCDR form completed.        

## 2018-08-20 NOTE — Anesthesia Procedure Notes (Signed)
Procedure Name: Intubation Date/Time: 08/20/2018 6:15 PM Performed by: Allean Found, CRNA Pre-anesthesia Checklist: Patient identified, Patient being monitored, Timeout performed, Emergency Drugs available and Suction available Patient Re-evaluated:Patient Re-evaluated prior to induction Oxygen Delivery Method: Circle system utilized Preoxygenation: Pre-oxygenation with 100% oxygen Induction Type: IV induction Ventilation: Mask ventilation without difficulty Laryngoscope Size: 3 and McGraph Grade View: Grade I Tube type: Oral Tube size: 7.0 mm Number of attempts: 1 Airway Equipment and Method: Stylet Placement Confirmation: ETT inserted through vocal cords under direct vision,  positive ETCO2 and breath sounds checked- equal and bilateral Secured at: 21 cm Tube secured with: Tape Dental Injury: Teeth and Oropharynx as per pre-operative assessment

## 2018-08-20 NOTE — Anesthesia Postprocedure Evaluation (Signed)
Anesthesia Post Note  Patient: Susan Snyder  Procedure(s) Performed: APPENDECTOMY LAPAROSCOPIC (N/A )  Patient location during evaluation: PACU Anesthesia Type: General Level of consciousness: awake and alert Pain management: pain level controlled Vital Signs Assessment: post-procedure vital signs reviewed and stable Respiratory status: spontaneous breathing, nonlabored ventilation and respiratory function stable Cardiovascular status: blood pressure returned to baseline and stable Postop Assessment: no apparent nausea or vomiting Anesthetic complications: no     Last Vitals:  Vitals:   08/20/18 2035 08/20/18 2144  BP: 126/78 102/68  Pulse: 82 93  Resp: 20 18  Temp: 36.8 C 36.8 C  SpO2: 98% 98%    Last Pain:  Vitals:   08/20/18 2144  TempSrc: Oral  PainSc:                  Durenda Hurt

## 2018-08-20 NOTE — Anesthesia Preprocedure Evaluation (Signed)
Anesthesia Evaluation  Patient identified by MRN, date of birth, ID band Patient awake    Reviewed: Allergy & Precautions, H&P , NPO status , Patient's Chart, lab work & pertinent test results  Airway Mallampati: III  TM Distance: >3 FB Neck ROM: limited    Dental  (+) Chipped   Pulmonary neg sleep apnea, neg COPD, Current Smoker,           Cardiovascular hypertension, (-) angina(-) Past MI, (-) Cardiac Stents and (-) CABG (-) dysrhythmias      Neuro/Psych  Headaches, H/o cervical spine surgery negative psych ROS   GI/Hepatic negative GI ROS, Neg liver ROS,   Endo/Other  negative endocrine ROS  Renal/GU      Musculoskeletal   Abdominal   Peds  Hematology negative hematology ROS (+)   Anesthesia Other Findings Past Medical History: No date: Chronic diarrhea Sept. 2015: Chronic gastritis     Comment:  on EGD  No date: Collagenous colitis     Comment:  colonoscopy biopsies 11/01/13 No date: Hyperplastic polyps of stomach     Comment:  Aug 2015 No date: Hypertension 05/20/2016: Neoplasm of uncertain behavior of skin     Comment:  Hx of skin cancer, BCC No date: Renal cyst  Past Surgical History: 2005: ABDOMINAL HYSTERECTOMY     Comment:  partial, still has ovaries, due to fibroids. [place]: BACK SURGERY     Comment:  x 4 11/01/13: COLONOSCOPY W/ BIOPSIES     Comment:  collagenous colitis, sigmoid polypectomy x 2;               hyperplastic polyps 11/22/13: ESOPHAGOGASTRODUODENOSCOPY     Comment:  chronic gastritis 01/15/14,03/05/14: HIP ARTHROPLASTY; Bilateral     Comment:  total hip, Dr. Nilsa Nutting 2015: NECK SURGERY  BMI    Body Mass Index:  33.45 kg/m      Reproductive/Obstetrics negative OB ROS                             Anesthesia Physical Anesthesia Plan  ASA: II and emergent  Anesthesia Plan: General ETT and Rapid Sequence   Post-op Pain Management:     Induction:   PONV Risk Score and Plan: Ondansetron, Dexamethasone, Midazolam and Treatment Rathe vary due to age or medical condition  Airway Management Planned:   Additional Equipment:   Intra-op Plan:   Post-operative Plan:   Informed Consent: I have reviewed the patients History and Physical, chart, labs and discussed the procedure including the risks, benefits and alternatives for the proposed anesthesia with the patient or authorized representative who has indicated his/her understanding and acceptance.     Dental Advisory Given  Plan Discussed with: Anesthesiologist and CRNA  Anesthesia Plan Comments:         Anesthesia Quick Evaluation

## 2018-08-20 NOTE — Op Note (Signed)
Operative Note  Laparoscopic Appendectomy   [name] Date of operation:  08/20/2018  Indications: The patient presented with a history of  abdominal pain. Workup has revealed findings consistent with acute appendicitis.  Pre-operative Diagnosis: Acute appendicitis without mention of peritonitis  Post-operative Diagnosis: Same  Surgeon: Fredirick Maudlin, MD  Anesthesia: GETA  Findings: Serositis and inflammation consistent with acute appendicitis.  No evidence of perforation or abscess.  Estimated Blood Loss: 10cc         Specimens: appendix         Complications: None immediately apparent  Procedure Details  The patient was seen again in the preop area. The options of surgery versus observation were reviewed with the patient and/or family. The risks of bleeding, infection, recurrence of symptoms, negative laparoscopy, potential for an open procedure, bowel injury, abscess or infection, were all reviewed as well. The patient was taken to Operating Room, identified as Susan Snyder and the procedure verified as laparoscopic appendectomy. A time out was performed and the above information confirmed.  The patient was placed in the supine position and general anesthesia was induced.  Antibiotic prophylaxis was administered and VT E prophylaxis was in place. A Foley catheter was placed by the nursing staff.   The abdomen was prepped and draped in a sterile fashion.  Using Optiview technique, a 5 mm trocar was introduced in the right upper quadrant.  The abdomen was insufflated to 15 mmHg with carbon dioxide gas an infraumbilical incision was made.  A 10 mm umbilical port and 2 5 mm working ports were then placed under direct vision.  The appendix was identified and found to be acutely inflamed, without evidence of perforation.  No abscess. The appendix was carefully dissected. The mesoappendix was divided withHarmonic scalpel. The base of the appendix was dissected out and divided with a  standard load Endo GIA.The appendix was placed in a Endo Catch bag and removed via the Hasson port. The right lower quadrant and pelvis was then irrigated with normal saline which was then aspirated. The right lower quadrant was inspected there was no sign of bleeding or bowel injury therefore pneumoperitoneum was released, all ports were removed.  The umbilical fascia was closed with 0 Vicryl interrupted sutures and the skin incisions were approximated with subcuticular 4-0 Monocryl. Dermabond was applied, followed by Steri-Strips. The patient tolerated the procedure well and there were no immediately apparent complications. The sponge lap and needle count were correct at the end of the procedure.  The patient was taken to the recovery room in stable condition to be admitted for continued care.   Fredirick Maudlin, MD, FACS

## 2018-08-20 NOTE — Consult Note (Signed)
Reason for Consult: Acute appendicitis Referring Physician: [facility], Emergency Medicine  Susan Snyder is an 53 y.o. female.  HPI: She came to the emergency department with acute onset of abdominal pain starting at 4:00 this morning.  She has a history of collagenous colitis and thought that perhaps something she ate exacerbated this.  She had diarrhea followed by nausea and vomiting.  Her emesis was nonbloody and nonbilious.  The pain is localized around her umbilicus with radiation to the right side of her body.  She denies any fevers or chills.  Work-up in the emergency department was concerning for acute appendicitis.  Past Medical History:  Diagnosis Date  . Chronic diarrhea   . Chronic gastritis Sept. 2015   on EGD   . Collagenous colitis    colonoscopy biopsies 11/01/13  . Hyperplastic polyps of stomach    Aug 2015  . Hypertension   . Neoplasm of uncertain behavior of skin 05/20/2016   Hx of skin cancer, BCC  . Renal cyst     Past Surgical History:  Procedure Laterality Date  . ABDOMINAL HYSTERECTOMY  2005   partial, still has ovaries, due to fibroids.  [place]   x 4  . COLONOSCOPY W/ BIOPSIES  11/01/13   collagenous colitis, sigmoid polypectomy x 2; hyperplastic polyps  . ESOPHAGOGASTRODUODENOSCOPY  11/22/13   chronic gastritis  . HIP ARTHROPLASTY Bilateral 01/15/14,03/05/14   total hip, Dr. Nilsa Nutting  . NECK SURGERY  2015    Family History  Problem Relation Age of Onset  . Stroke Mother   . Glaucoma Mother   . Heart disease Father   . Cancer Father        prostate  . Heart disease Sister   . Diabetes Sister   . Diabetes Paternal Aunt   . Heart disease Paternal Aunt   . Bleeding Disorder Paternal Uncle   . Heart disease Sister        Psychologist, forensic  . Diabetes Paternal Aunt   . Heart disease Paternal Aunt     Social History:  reports that she has been smoking cigarettes. She has been smoking about 0.50 packs per day. She has never used  smokeless tobacco. She reports that she does not drink alcohol or use drugs.  Allergies:  Allergies  Allergen Reactions  . Adhesive [Tape]   . Gabapentin Nausea Only    Higher Dose causes vomiting Higher Dose causes vomiting  . Keflex [Cephalexin]   . Penicillins   . Sulfa Antibiotics     Medications: I have reviewed the patient's current medications.  Results for orders placed or performed during the hospital encounter of 08/20/18 (from the past 48 hour(s))  Urinalysis, Complete w Microscopic     Status: Abnormal   Collection Time: 08/20/18 11:11 AM  Result Value Ref Range   Color, Urine YELLOW (A) YELLOW   APPearance HAZY (A) CLEAR   Specific Gravity, Urine 1.016 1.005 - 1.030   pH 5.0 5.0 - 8.0   Glucose, UA NEGATIVE NEGATIVE mg/dL   Hgb urine dipstick NEGATIVE NEGATIVE   Bilirubin Urine NEGATIVE NEGATIVE   Ketones, ur 5 (A) NEGATIVE mg/dL   Protein, ur NEGATIVE NEGATIVE mg/dL   Nitrite NEGATIVE NEGATIVE   Leukocytes,Ua NEGATIVE NEGATIVE   RBC / HPF 0-5 0 - 5 RBC/hpf   WBC, UA 0-5 0 - 5 WBC/hpf   Bacteria, UA NONE SEEN NONE SEEN   Squamous Epithelial / LPF 0-5 0 - 5  Mucus PRESENT    Hyaline Casts, UA PRESENT     Comment: Performed at [facility], [address] [place], [place] [address]  Basic metabolic panel     Status: Abnormal   Collection Time: 08/20/18 11:45 AM  Result Value Ref Range   Sodium 139 135 - 145 mmol/L   Potassium 3.6 3.5 - 5.1 mmol/L   Chloride 107 98 - 111 mmol/L   CO2 22 22 - 32 mmol/L   Glucose, Bld 113 (H) 70 - 99 mg/dL   BUN 16 6 - 20 mg/dL   Creatinine, Ser 0.82 0.44 - 1.00 mg/dL   Calcium 8.8 (L) 8.9 - 10.3 mg/dL   GFR calc non Af Amer >60 >60 mL/min   GFR calc Af Amer >60 >60 mL/min   Anion gap 10 5 - 15    Comment: Performed at [facility], [address] [place], [place] [address]  Hepatic function panel     Status: None   Collection Time: 08/20/18 11:45 AM  Result Value Ref Range   Total Protein 6.9 6.5  - 8.1 g/dL   Albumin 4.0 3.5 - 5.0 g/dL   AST 16 15 - 41 U/L   ALT 14 0 - 44 U/L   Alkaline Phosphatase 82 38 - 126 U/L   Total Bilirubin 0.4 0.3 - 1.2 mg/dL   Bilirubin, Direct 0.1 0.0 - 0.2 mg/dL   Indirect Bilirubin 0.3 0.3 - 0.9 mg/dL    Comment: Performed at [facility], [address] [place], [place] [address]  Lipase, blood     Status: None   Collection Time: 08/20/18 11:45 AM  Result Value Ref Range   Lipase 36 11 - 51 U/L    Comment: Performed at [facility], [address] [place], [place] [address]  CBC with Differential     Status: Abnormal   Collection Time: 08/20/18 11:45 AM  Result Value Ref Range   WBC 17.3 (H) 4.0 - 10.5 K/uL   RBC 4.38 3.87 - 5.11 MIL/uL   Hemoglobin 12.7 12.0 - 15.0 g/dL   HCT 39.7 36.0 - 46.0 %   MCV 90.6 80.0 - 100.0 fL   MCH 29.0 26.0 - 34.0 pg   MCHC 32.0 30.0 - 36.0 g/dL   RDW 14.4 11.5 - 15.5 %   Platelets 323 150 - 400 K/uL   nRBC 0.0 0.0 - 0.2 %   Neutrophils Relative % 79 %   Neutro Abs 13.7 (H) 1.7 - 7.7 K/uL   Lymphocytes Relative 14 %   Lymphs Abs 2.5 0.7 - 4.0 K/uL   Monocytes Relative 4 %   Monocytes Absolute 0.6 0.1 - 1.0 K/uL   Eosinophils Relative 2 %   Eosinophils Absolute 0.4 0.0 - 0.5 K/uL   Basophils Relative 0 %   Basophils Absolute 0.1 0.0 - 0.1 K/uL   Immature Granulocytes 1 %   Abs Immature Granulocytes 0.11 (H) 0.00 - 0.07 K/uL    Comment: Performed at [facility], [address] [place], [place] [address]  Troponin I - Once     Status: None   Collection Time: 08/20/18 11:45 AM  Result Value Ref Range   Troponin I <0.03 <0.03 ng/mL    Comment: Performed at [facility], [address] [place], [place] [address]    Ct Abdomen Pelvis W Contrast  Result Date: 08/20/2018 CLINICAL DATA:  Abdominal pain EXAM: CT ABDOMEN AND PELVIS WITH CONTRAST TECHNIQUE: Multidetector CT imaging of  the abdomen and pelvis was performed using the standard protocol following bolus  administration of intravenous contrast. CONTRAST:  180m OMNIPAQUE IOHEXOL 300 MG/ML  SOLN COMPARISON:  None. FINDINGS: Lower chest: No acute abnormality. Hepatobiliary: Single lamellated gallstone is present. There is no evidence of inflammatory change. Tiny hypodensity in the inferior tip of the right lobe of the liver is noted on image 46. Pancreas: Unremarkable Spleen: Unremarkable Adrenals/Urinary Tract: Adrenal glands are within normal limits. Left kidney is unremarkable or. Simple cysts in the lower pole of the right kidney. Bladder is decompressed. Stomach/Bowel: The appendix is distended. There are inflammatory changes in the adjacent fat. And appendicoliths is present at the base of the appendix. These findings are consistent with acute appendicitis. There is no evidence of external bowel gas to suggest rupture. No abscess formation. Small bowel is decompressed. Small hiatal hernia is suspected. Vascular/Lymphatic: No evidence of aortic aneurysm. Atherosclerotic vascular calcifications are noted. No abnormal retroperitoneal adenopathy. Small inflammatory nodes in the cecal mesentery. Reproductive: Uterus is absent. Other: No free fluid. Musculoskeletal: No vertebral compression deformity. Degenerative disc disease in the mid and lower lumbar spine. IMPRESSION: Acute appendicitis. Critical Value/emergent results were called by telephone at the time of interpretation on 08/20/2018 at 12:56 pm to Dr. SArta Silence, who verbally acknowledged these results. Cholelithiasis. Electronically Signed   By: AMarybelle KillingsM.D.   On: 08/20/2018 12:57    Review of Systems  All other systems reviewed and are negative.  Blood pressure (!) 154/81, pulse 86, temperature 98.3 F (36.8 C), resp. rate 18, height 5' 8"  (1.727 m), weight 99.8 kg, SpO2 100 %. Physical Exam  Constitutional: She is oriented to person, place, and time. She appears well-developed and well-nourished. No distress.  HENT:  Head:  Normocephalic and atraumatic.  Eyes: No scleral icterus.  Neck: No tracheal deviation present.  Cardiovascular: Normal rate and regular rhythm.  Respiratory: Effort normal. No stridor.  GI: Soft. She exhibits no distension. There is no rebound.  Tender to palpation around the umbilicus and into the right lower quadrant.  Genitourinary:    Genitourinary Comments: Deferred   Musculoskeletal:        General: No deformity.  Neurological: She is alert and oriented to person, place, and time.  Skin: Skin is warm and dry.  Psychiatric: She has a normal mood and affect. Her behavior is normal. Thought content normal.    Assessment/Plan: This is a 53year old woman with abdominal pain, nausea, vomiting, and labs and imaging as well as physical exam consistent with acute appendicitis.  I recommended that she under go appendectomy.  The risks of the procedure were discussed with her.  These include, but are not limited to, bleeding, infection, injury to surrounding structures, need to convert to an open procedure, abscess formation, need for additional procedures.  We will proceed to the operating room pending availability.  JFredirick Maudlin5/31/2020, 1:34 PM

## 2018-08-21 ENCOUNTER — Encounter: Payer: Self-pay | Admitting: General Surgery

## 2018-08-21 LAB — CBC
HCT: 37.4 % (ref 36.0–46.0)
Hemoglobin: 11.9 g/dL — ABNORMAL LOW (ref 12.0–15.0)
MCH: 29.7 pg (ref 26.0–34.0)
MCHC: 31.8 g/dL (ref 30.0–36.0)
MCV: 93.3 fL (ref 80.0–100.0)
Platelets: 280 10*3/uL (ref 150–400)
RBC: 4.01 MIL/uL (ref 3.87–5.11)
RDW: 14.6 % (ref 11.5–15.5)
WBC: 13.1 10*3/uL — ABNORMAL HIGH (ref 4.0–10.5)
nRBC: 0 % (ref 0.0–0.2)

## 2018-08-21 LAB — BASIC METABOLIC PANEL
Anion gap: 7 (ref 5–15)
BUN: 14 mg/dL (ref 6–20)
CO2: 25 mmol/L (ref 22–32)
Calcium: 8.5 mg/dL — ABNORMAL LOW (ref 8.9–10.3)
Chloride: 107 mmol/L (ref 98–111)
Creatinine, Ser: 0.84 mg/dL (ref 0.44–1.00)
GFR calc Af Amer: >60 mL/min (ref >60)
GFR calc non Af Amer: >60 mL/min (ref >60)
Glucose, Bld: 126 mg/dL — ABNORMAL HIGH (ref 70–99)
Potassium: 4.3 mmol/L (ref 3.5–5.1)
Sodium: 139 mmol/L (ref 135–145)

## 2018-08-21 MED ORDER — SODIUM CHLORIDE 0.9 % IV SOLN
INTRAVENOUS | Status: DC | PRN
  Administered 2018-08-21: 04:00:00 250 mL via INTRAVENOUS

## 2018-08-21 MED ORDER — OXYCODONE HCL 5 MG PO TABS: 5.0000 mg | ORAL_TABLET | Freq: Four times a day (QID) | ORAL | 0 refills | Status: DC | PRN

## 2018-08-21 NOTE — Discharge Instructions (Signed)
In addition to included general post-operative instructions for laparoscopic appendectomy,  Diet: Resume home diet.   Activity: No heavy lifting >20 pounds (children, pets, laundry, garbage) or strenuous activity until follow-up in 2 weeks, but light activity and walking are encouraged. Do not drive or drink alcohol if taking narcotic pain medications.  Wound care: 2 days after surgery (06/02), you [place] shower/get incision wet with soapy water and pat dry (do not rub incisions), but no baths or submerging incision underwater until follow-up.   Medications: Resume all home medications. For mild to moderate pain: acetaminophen (Tylenol) or ibuprofen/naproxen (if no kidney disease). Combining Tylenol with alcohol can substantially increase your risk of causing liver disease. Narcotic pain medications, if prescribed, can be used for severe pain, though Fuhr cause nausea, constipation, and drowsiness. Do not combine Tylenol and Percocet (or similar) within a 6 hour period as Percocet (and similar) contain(s) Tylenol. If you do not need the narcotic pain medication, you do not need to fill the prescription.  Call office [phone] / [phone]) at any time if any questions, worsening pain, fevers/chills, bleeding, drainage from incision site, or other concerns.

## 2018-08-21 NOTE — Discharge Summary (Signed)
[facility] SURGICAL DISCHARGE SUMMARY  [name] MRN: [id number] DOB/AGE: Dec 29, 1965 53 y.o.  Admit date: 08/20/2018 Discharge date: 08/21/2018  Discharge Diagnoses Acute Appendicitis  Consultants None  Procedures 05/31:  Laparoscopic Appendectomy  HPI: She came to the emergency department with acute onset of abdominal pain starting at 4:00 this morning.  She has a history of collagenous colitis and thought that perhaps something she ate exacerbated this.  She had diarrhea followed by nausea and vomiting.  Her emesis was nonbloody and nonbilious.  The pain is localized around her umbilicus with radiation to the right side of her body.  She denies any fevers or chills.  Work-up in the emergency department was concerning for acute appendicitis.  Hospital Course: Informed consent was obtained and documented, and patient underwent uneventful laparoscopic appendectomy (Dr Celine Ahr, 08/20/2018).  Post-operatively, patient's pain improved/resolved and advancement of patient's diet and ambulation were well-tolerated. The remainder of patient's hospital course was essentially unremarkable, and discharge planning was initiated accordingly with patient safely able to be discharged home with appropriate discharge instructions, pain control, and outpatient follow-up after all of her questions were answered to her expressed satisfaction.   Discharge Condition: Good   Physical Examination:  Constitutional: Well appearing female, NAD HEENT: No scleral icterus, EOM intact Pulmonary: Normal effort, no respiratory distress Gastrointestinal: Soft, incisional soreness, non-distended, no rebound/guarding Skin: Laparoscopic incisions are CDI with steri strips, no erythema or drainage.    Allergies as of 08/21/2018      Reactions   Adhesive [tape]    Gabapentin Nausea Only   Higher Dose causes vomiting Higher Dose causes vomiting   Keflex [cephalexin]    Penicillins    Sulfa Antibiotics       Medication List    STOP taking these medications   ciprofloxacin 500 MG tablet Commonly known as:  Cipro     TAKE these medications   aspirin EC 81 MG tablet Take 1 tablet (81 mg total) by mouth daily.   atorvastatin 10 MG tablet Commonly known as:  LIPITOR Take 1 tablet (10 mg total) by mouth at bedtime.   buPROPion 300 MG 24 hr tablet Commonly known as:  Wellbutrin XL Take 1 tablet (300 mg total) by mouth daily.   loperamide 2 MG capsule Commonly known as:  IMODIUM Take 2 mg by mouth as needed.   metoprolol succinate 25 MG 24 hr tablet Commonly known as:  TOPROL-XL Take 1 tablet (25 mg total) by mouth daily.   nitrofurantoin (macrocrystal-monohydrate) 100 MG capsule Commonly known as:  Macrobid Take 1 capsule (100 mg total) by mouth 2 (two) times daily.   oxyCODONE 5 MG immediate release tablet Commonly known as:  Oxy IR/ROXICODONE Take 1 tablet (5 mg total) by mouth every 6 (six) hours as needed for severe pain.   Vitamin D (Cholecalciferol) 25 MCG (1000 UT) Tabs Take 1 tablet by mouth daily.   Vitamin D (Ergocalciferol) 1.25 MG (50000 UT) Caps capsule Commonly known as:  DRISDOL Take 1 capsule (50,000 Units total) by mouth every 7 (seven) days.        Follow-up Information    Fredirick Maudlin, MD. Schedule an appointment as soon as possible for a visit in 2 week(s).   Specialty:  General Surgery Why:  s/p lap appy Contact information: [address] [place] [address] [phone]            Time spent on discharge management including discussion of hospital course, clinical condition, outpatient  instructions, prescriptions, and follow up with the patient and members of the medical team: >30 minutes  -- [place] Simon , PA-C [facility]  08/21/2018, 8:47 AM [phone] M-F: 7am - 4pm

## 2018-08-21 NOTE — Progress Notes (Signed)
Dejana Pugsley Smyth  A and O x 4. VSS. Pt tolerating diet well. No complaints of pain or nausea. IV removed intact, prescriptions given. Pt voiced understanding of discharge instructions with no further questions. Pt discharged via wheelchair.   Allergies as of 08/21/2018      Reactions   Adhesive [tape]    Gabapentin Nausea Only   Higher Dose causes vomiting Higher Dose causes vomiting   Keflex [cephalexin]    Penicillins    Sulfa Antibiotics       Medication List    STOP taking these medications   ciprofloxacin 500 MG tablet Commonly known as:  Cipro     TAKE these medications   aspirin EC 81 MG tablet Take 1 tablet (81 mg total) by mouth daily.   atorvastatin 10 MG tablet Commonly known as:  LIPITOR Take 1 tablet (10 mg total) by mouth at bedtime.   buPROPion 300 MG 24 hr tablet Commonly known as:  Wellbutrin XL Take 1 tablet (300 mg total) by mouth daily.   loperamide 2 MG capsule Commonly known as:  IMODIUM Take 2 mg by mouth as needed.   metoprolol succinate 25 MG 24 hr tablet Commonly known as:  TOPROL-XL Take 1 tablet (25 mg total) by mouth daily.   nitrofurantoin (macrocrystal-monohydrate) 100 MG capsule Commonly known as:  Macrobid Take 1 capsule (100 mg total) by mouth 2 (two) times daily.   oxyCODONE 5 MG immediate release tablet Commonly known as:  Oxy IR/ROXICODONE Take 1 tablet (5 mg total) by mouth every 6 (six) hours as needed for severe pain.   Vitamin D (Cholecalciferol) 25 MCG (1000 UT) Tabs Take 1 tablet by mouth daily.   Vitamin D (Ergocalciferol) 1.25 MG (50000 UT) Caps capsule Commonly known as:  DRISDOL Take 1 capsule (50,000 Units total) by mouth every 7 (seven) days.       Vitals:   08/20/18 2144 08/21/18 0524  BP: 102/68 111/61  Pulse: 93 75  Resp: 18 18  Temp: 98.2 F (36.8 C) 97.9 F (36.6 C)  SpO2: 98% 96%    Francesco Sor

## 2018-08-22 LAB — SURGICAL PATHOLOGY

## 2018-09-06 ENCOUNTER — Telehealth (INDEPENDENT_AMBULATORY_CARE_PROVIDER_SITE_OTHER): Payer: BLUE CROSS/BLUE SHIELD | Admitting: General Surgery

## 2018-09-06 ENCOUNTER — Other Ambulatory Visit: Payer: Self-pay

## 2018-09-06 DIAGNOSIS — K3580 Unspecified acute appendicitis: Secondary | ICD-10-CM

## 2018-09-06 NOTE — Progress Notes (Signed)
Virtual Visit via Telephone Note  I connected with Susan Snyder on 09/06/18 at  9:00 AM EDT by telephone and verified that I am speaking with the correct person using two identifiers.   I discussed the limitations, risks, security and privacy concerns of performing an evaluation and management service by telephone and the availability of in person appointments. I also discussed with the patient that there [place] be a patient responsible charge related to this service. The patient expressed understanding and agreed to proceed.   History of Present Illness: Susan Snyder is a 53 year old woman who presented to the emergency department on 31 Sanko 2020.  She was diagnosed with acute appendicitis.  She underwent an uncomplicated laparoscopic appendectomy and was discharged to home the following day.  Her final pathology was consistent with acute, nonperforated appendicitis.    Observations/Objective: She reports that for the first few days, she had some decreased appetite and discomfort in her abdomen, but did not require the use of narcotic pain medications.  She says that over the past several days, she has felt essentially normal.  She states that her incision sites all look good and she is not concerned with infection at any of them.  Assessment and Plan: This is a 53 year old woman who had acute appendicitis.  She had a straightforward laparoscopic appendectomy and has recovered well from this procedure.  Follow Up Instructions: No specific follow-up has been arranged.  She Wood[place] resume all of her usual activities, with the exception that she should continue to avoid lifting anything heavier than 10 pounds for another 2 weeks.   I discussed the assessment and treatment plan with the patient. The patient was provided an opportunity to ask questions and all were answered. The patient agreed with the plan and demonstrated an understanding of the instructions.   The patient was advised to call back or seek an  in-person evaluation if the symptoms worsen or if the condition fails to improve as anticipated.  I provided 5 minutes of non-face-to-face time during this encounter.   Fredirick Maudlin, MD

## 2018-09-14 ENCOUNTER — Other Ambulatory Visit: Payer: Self-pay | Admitting: Family Medicine

## 2018-09-14 NOTE — Telephone Encounter (Signed)
Medication: metoprolol succinate (TOPROL-XL) 25 MG 24 hr tablet  Has the patient contacted their pharmacy? no  Preferred Pharmacy (with phone number or street name):  [id number] [phone](Phone) [phone](Fax)   Agent: Please be advised that RX refills [place] take up to 3 business days. We ask that you follow-up with your pharmacy.

## 2018-11-16 ENCOUNTER — Other Ambulatory Visit: Payer: Self-pay | Admitting: Family Medicine

## 2018-11-17 NOTE — Telephone Encounter (Signed)
Needs routine appointment in 3 months

## 2019-01-30 ENCOUNTER — Ambulatory Visit (INDEPENDENT_AMBULATORY_CARE_PROVIDER_SITE_OTHER): Payer: BLUE CROSS/BLUE SHIELD | Admitting: Family Medicine

## 2019-01-30 ENCOUNTER — Encounter: Payer: Self-pay | Admitting: Family Medicine

## 2019-01-30 ENCOUNTER — Other Ambulatory Visit: Payer: Self-pay

## 2019-01-30 VITALS — Ht 69.0 in | Wt 220.0 lb

## 2019-01-30 DIAGNOSIS — I10 Essential (primary) hypertension: Secondary | ICD-10-CM

## 2019-01-30 DIAGNOSIS — E538 Deficiency of other specified B group vitamins: Secondary | ICD-10-CM | POA: Diagnosis not present

## 2019-01-30 DIAGNOSIS — K52831 Collagenous colitis: Secondary | ICD-10-CM

## 2019-01-30 DIAGNOSIS — F419 Anxiety disorder, unspecified: Secondary | ICD-10-CM

## 2019-01-30 DIAGNOSIS — Z5181 Encounter for therapeutic drug level monitoring: Secondary | ICD-10-CM

## 2019-01-30 DIAGNOSIS — E559 Vitamin D deficiency, unspecified: Secondary | ICD-10-CM

## 2019-01-30 DIAGNOSIS — E782 Mixed hyperlipidemia: Secondary | ICD-10-CM | POA: Diagnosis not present

## 2019-01-30 DIAGNOSIS — F329 Major depressive disorder, single episode, unspecified: Secondary | ICD-10-CM

## 2019-01-30 MED ORDER — BUPROPION HCL ER (XL) 150 MG PO TB24: 150.0000 mg | ORAL_TABLET | Freq: Every day | ORAL | 1 refills | Status: DC

## 2019-01-30 NOTE — Progress Notes (Signed)
Name: Susan Snyder   MRN: [id number]    DOB: 05/19/1965   Date:01/30/2019       Progress Note  Subjective:    Chief Complaint  Chief Complaint  Patient presents with  . Follow-up  . Medication Refill  . Depression    pt states the bupropion 346m hurts stomach, wants to see if she can take 1588mbid?  . Marland Kitchenypertension  . Hyperlipidemia    I connected with  SuAnissia Wessellsay  on 01/30/19 at  8:00 AM EST by a video enabled telemedicine application and verified that I am speaking with the correct person using two identifiers.  I discussed the limitations of evaluation and management by telemedicine and the availability of in person appointments. The patient expressed understanding and agreed to proceed. Staff also discussed with the patient that there Robak be a patient responsible charge related to this service. Patient Location: home Provider [facility] Additional Individuals present: none  HPI Patient presents for med refill for her anxiety and depression: Wellbutrin XL 300 mg for anxiety, but it hurts her stomach, she wants to switch back to 150 mg.  When she briefly ran out of 300 mg, she started taking left over 150 mg tablets and her stomach pain went away.  She previously had disregarded it as a SE because she has colitis/celiac disease.  Overall she feels her moods, anxiety and depression are well controlled with a 300 mg dose but she seems to tolerate 150 mg tablets much better with twice daily dosing    Hyperlipidemia:  Current Medication Regimen: lipitor 10 mg Last Lipids:   Lab Results  Component Value Date   CHOL 144 04/13/2018   HDL 32 (L) 04/13/2018   LDLCALC 90 04/13/2018   TRIG 122 04/13/2018   CHOLHDL 4.5 04/13/2018   - Current Diet:  Staying away from salt,  - Denies: Chest pain, shortness of breath, myalgias. - Documented aortic atherosclerosis? Yes - Risk factors for atherosclerosis: hypercholesterolemia, hypertension and smoking  Hasn't gained any  weight during COVID, work has been very busy so she's not exercising like she usually does  Shes working on [employer]ing more carbs chicken, fish, [place] Hx of collagenous colitis - has not had her scheduled GI colonoscopy  She does have a history of vitamin D deficiency and vitamin B12 deficiency, she is still supplementing with vitamin D over-the-counter.  Patient Active Problem List   Diagnosis Date Noted  . Acute appendicitis 08/20/2018  . Preventative health care 05/20/2016  . Neoplasm of uncertain behavior of skin 05/20/2016  . 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 Vitamin B12 deficiency 01/14/2015  . Vitamin D deficiency 01/14/2015  . Obesity 01/14/2015  . Hx of iron deficiency anemia 01/14/2015  . Frequent headaches 12/28/2014  . Abnormal finding on MRI of brain 12/28/2014  . Tobacco abuse 12/23/2014  . Stress reaction 12/23/2014  . Hypertension   . Renal cyst   . Collagenous colitis   . H/O bilateral hip replacements 06/28/2014  . Celiac disease 01/02/2014  . Primary localized osteoarthrosis, pelvic region and thigh 10/25/2013  . Cervical spondylosis 02/06/2013    Past Surgical History:  Procedure Laterality Date  . ABDOMINAL HYSTERECTOMY  2005   partial, still has ovaries, due to fibroids.  . [place] x [place]  . COLONOSCOPY W/ BIOPSIES  11/01/13   collagenous colitis, sigmoid polypectomy x 2; hyperplastic polyps  . ESOPHAGOGASTRODUODENOSCOPY  11/22/13   chronic gastritis  .  HIP ARTHROPLASTY Bilateral 01/15/14,03/05/14   total hip, Dr. Nilsa Nutting  . LAPAROSCOPIC APPENDECTOMY N/A 08/20/2018   Procedure: APPENDECTOMY LAPAROSCOPIC;  Surgeon: Fredirick Maudlin, MD;  Location: [facility] ORS;  Service: General;  Laterality: N/A;  . NECK SURGERY  2015    Family History  Problem Relation Age of Onset  . Stroke Mother   . Glaucoma Mother   . Heart disease Father   . Cancer Father        prostate  . Heart disease Sister   . Diabetes Sister   .  Diabetes Paternal Aunt   . Heart disease Paternal Aunt   . Bleeding Disorder Paternal Uncle   . Heart disease Sister        Psychologist, forensic  . Diabetes Paternal Aunt   . Heart disease Paternal Aunt     Social History   Socioeconomic History  . Marital status: Widowed    Spouse name: Not on file  . Number of children: 2  . Years of education: 41  . Highest education level: Some college, no degree  Occupational History  . Occupation: Barista  Social Needs  . Financial resource strain: Not hard at all  . Food insecurity    Worry: Never true    Inability: Never true  . Transportation needs    Medical: No    Non-medical: No  Tobacco Use  . Smoking status: Current Every Day Smoker    Packs/day: 0.50    Types: Cigarettes  . Smokeless tobacco: Never Used  Substance and Sexual Activity  . Alcohol use: No  . Drug use: No  . Sexual activity: Not Currently  Lifestyle  . Physical activity    Days per week: 2 days    Minutes per session: 30 min  . Stress: Very much  Relationships  . Social connections    Talks on phone: More than three times a week    Gets together: More than three times a week    Attends religious service: More than 4 times per year    Active member of club or organization: No    Attends meetings of clubs or organizations: Never    Relationship status: Widowed  . Intimate partner violence    Fear of current or ex partner: No    Emotionally abused: No    Physically abused: No    Forced sexual activity: No  Other Topics Concern  . Not on file  Social History Narrative  . Not on file     Current Outpatient Medications:  .  aspirin EC 81 MG tablet, Take 1 tablet (81 mg total) by mouth daily., Disp: , Rfl:  .  atorvastatin (LIPITOR) 10 MG tablet, TAKE 1 TABLET BY MOUTH EVERYDAY AT BEDTIME, Disp: 90 tablet, Rfl: 0 .  buPROPion (WELLBUTRIN XL) 300 MG 24 hr tablet, Take 1 tablet (300 mg total) by mouth daily., Disp: 90 tablet, Rfl: 3 .  loperamide  (IMODIUM) 2 MG capsule, Take 2 mg by mouth as needed. , Disp: , Rfl:  .  metoprolol succinate (TOPROL-XL) 25 MG 24 hr tablet, TAKE 1 TABLET BY MOUTH EVERY DAY**NEEDS OFFICE VISIT**, Disp: 90 tablet, Rfl: 1 .  Vitamin D, Cholecalciferol, 25 MCG (1000 UT) TABS, Take 1 tablet by mouth daily., Disp: , Rfl:  .  oxyCODONE (OXY IR/ROXICODONE) 5 MG immediate release tablet, Take 1 tablet (5 mg total) by mouth every 6 (six) hours as needed for severe pain. (Patient not taking: Reported on 01/30/2019), Disp: 20 tablet,  Rfl: 0  Allergies  Allergen Reactions  . Adhesive [Tape]   . Gabapentin Nausea Only    Higher Dose causes vomiting Higher Dose causes vomiting  . Keflex [Cephalexin]   . Penicillins   . Sulfa Antibiotics     I personally reviewed active problem list, medication list, allergies, family history, social history, health maintenance, notes from last encounter, lab results with the patient/caregiver today.  Review of Systems    Objective:    Virtual encounter, vitals limited, only able to obtain the following Today's Vitals   01/30/19 0803  Weight: 220 lb (99.8 kg)  Height: 5' 9"  (1.753 m)   Body mass index is 32.49 kg/m. Nursing Note and Vital Signs reviewed.  Physical Exam Vitals signs and nursing note reviewed.  Constitutional:      General: She is not in acute distress.    Appearance: She is well-developed. She is obese. She is not ill-appearing, toxic-appearing or diaphoretic.  HENT:     Head: Normocephalic and atraumatic.     Nose: Nose normal.  Eyes:     General:        Right eye: No discharge.        Left eye: No discharge.     Conjunctiva/sclera: Conjunctivae normal.  Neck:     Trachea: No tracheal deviation.  Pulmonary:     Effort: Pulmonary effort is normal. No respiratory distress.     Breath sounds: No stridor.  Skin:    Coloration: Skin is not jaundiced or pale.     Findings: No rash.  Neurological:     Mental Status: She is alert.     Motor: No  abnormal muscle tone.  Psychiatric:        Mood and Affect: Mood normal.        Behavior: Behavior normal.     PE limited by telephone encounter  No results found for this or any previous visit (from the past 72 hour(s)).  PHQ2/9: Depression screen [facility] 2/9 01/30/2019 06/26/2018 04/13/2018 08/25/2017 06/24/2016  Decreased Interest 0 0 0 0 0  Down, Depressed, Hopeless 0 0 0 1 0  PHQ - 2 Score 0 0 0 1 0  Altered sleeping 0 0 0 0 -  Tired, decreased energy 0 0 0 0 -  Change in appetite 0 0 0 0 -  Feeling bad or failure about yourself  0 0 0 0 -  Trouble concentrating 0 0 0 0 -  Moving slowly or fidgety/restless 0 0 0 0 -  Suicidal thoughts 0 0 0 0 -  PHQ-9 Score 0 0 0 1 -  Difficult doing work/chores Not difficult at all Not difficult at all Not difficult at all Not difficult at all -   PHQ-2/9 Result is negative, reviewed with pt today  Fall Risk: Fall Risk  01/30/2019 07/07/2018 06/26/2018 04/13/2018 08/25/2017  Falls in the past year? 0 0 0 1 No  Number falls in past yr: 0 - - 0 -  Injury with Fall? 0 - - 0 -     Assessment and Plan:     ICD-10-CM   1. Vitamin D deficiency  E55.9 Vit D   On over-the-counter supplement, very low in the past, recheck  2. Vitamin B12 deficiency  E53.8 CBC w/ Diff    Vitamin B12   History of B12 deficiency recheck labs  3. Essential hypertension  I10 CMP w GFR   Well-controlled recheck renal function  4. Mixed hyperlipidemia  E78.2 CMP w GFR  Lipid Panel   Recheck labs, compliant with medications  5. Collagenous colitis  K52.831    hx of chronic diarrhea, sx at baseline, frequent watery BM's 5-6 in the morning and every time she eats - recheck for nutritional deficiencies  6. Encounter for medication monitoring  Z51.81 CBC w/ Diff    CMP w GFR    Lipid Panel    Vit D    Vitamin B12  7. Anxiety disorder, unspecified type  F41.9    well controlled see below  8. Major depressive disorder with current active episode, unspecified depression  episode severity, unspecified whether recurrent  F32.9    well controlled with current meds, PHQ9 neg, change in meds today for BID dosing       I discussed the assessment and treatment plan with the patient. The patient was provided an opportunity to ask questions and all were answered. The patient agreed with the plan and demonstrated an understanding of the instructions.  The patient was advised to call back or seek an in-person evaluation if the symptoms worsen or if the condition fails to improve as anticipated.  I provided 22  minutes of non-face-to-face time during this encounter.  Delsa Grana, PA-C 11/10/208:21 AM

## 2019-01-31 ENCOUNTER — Encounter: Payer: Self-pay | Admitting: Family Medicine

## 2019-01-31 ENCOUNTER — Other Ambulatory Visit: Payer: Self-pay | Admitting: Family Medicine

## 2019-01-31 LAB — COMPLETE METABOLIC PANEL WITH GFR
AG Ratio: 1.8 (calc) (ref 1.0–2.5)
ALT: 12 U/L (ref 6–29)
AST: 12 U/L (ref 10–35)
Albumin: 4.1 g/dL (ref 3.6–5.1)
Alkaline phosphatase (APISO): 90 U/L (ref 37–153)
BUN: 17 mg/dL (ref 7–25)
CO2: 24 mmol/L (ref 20–32)
Calcium: 9.1 mg/dL (ref 8.6–10.4)
Chloride: 109 mmol/L (ref 98–110)
Creat: 0.86 mg/dL (ref 0.50–1.05)
GFR, Est African American: 89 mL/min/{1.73_m2} (ref >=60)
GFR, Est Non African American: 77 mL/min/{1.73_m2} (ref >=60)
Globulin: 2.3 g/dL (calc) (ref 1.9–3.7)
Glucose, Bld: 117 mg/dL — ABNORMAL HIGH (ref 65–99)
Potassium: 3.9 mmol/L (ref 3.5–5.3)
Sodium: 140 mmol/L (ref 135–146)
Total Bilirubin: 0.3 mg/dL (ref 0.2–1.2)
Total Protein: 6.4 g/dL (ref 6.1–8.1)

## 2019-01-31 LAB — CBC WITH DIFFERENTIAL/PLATELET
Absolute Monocytes: 357 cells/uL (ref 200–950)
Basophils Absolute: 32 cells/uL (ref 0–200)
Basophils Relative: 0.3 %
Eosinophils Absolute: 431 cells/uL (ref 15–500)
Eosinophils Relative: 4.1 %
HCT: 38.3 % (ref 35.0–45.0)
Hemoglobin: 12.6 g/dL (ref 11.7–15.5)
Lymphs Abs: 3570 cells/uL (ref 850–3900)
MCH: 29.5 pg (ref 27.0–33.0)
MCHC: 32.9 g/dL (ref 32.0–36.0)
MCV: 89.7 fL (ref 80.0–100.0)
MPV: 11.5 fL (ref 7.5–12.5)
Monocytes Relative: 3.4 %
Neutro Abs: 6111 cells/uL (ref 1500–7800)
Neutrophils Relative %: 58.2 %
Platelets: 282 10*3/uL (ref 140–400)
RBC: 4.27 10*6/uL (ref 3.80–5.10)
RDW: 12.8 % (ref 11.0–15.0)
Total Lymphocyte: 34 %
WBC: 10.5 10*3/uL (ref 3.8–10.8)

## 2019-01-31 LAB — VITAMIN B12: Vitamin B-12: 285 pg/mL (ref 200–1100)

## 2019-01-31 LAB — LIPID PANEL
Cholesterol: 146 mg/dL (ref <200)
HDL: 32 mg/dL — ABNORMAL LOW (ref >=50)
LDL Cholesterol (Calc): 89 mg/dL (calc)
Non-HDL Cholesterol (Calc): 114 mg/dL (calc) (ref <130)
Total CHOL/HDL Ratio: 4.6 (calc) (ref <5.0)
Triglycerides: 153 mg/dL — ABNORMAL HIGH (ref <150)

## 2019-01-31 LAB — VITAMIN D 25 HYDROXY (VIT D DEFICIENCY, FRACTURES): Vit D, 25-Hydroxy: 16 ng/mL — ABNORMAL LOW (ref 30–100)

## 2019-01-31 MED ORDER — VITAMIN D (ERGOCALCIFEROL) 1.25 MG (50000 UNIT) PO CAPS: 50000.0000 [IU] | ORAL_CAPSULE | ORAL | 0 refills | Status: DC

## 2019-02-23 ENCOUNTER — Other Ambulatory Visit: Payer: Self-pay | Admitting: Emergency Medicine

## 2019-02-23 MED ORDER — ATORVASTATIN CALCIUM 10 MG PO TABS: ORAL_TABLET | ORAL | 1 refills | Status: DC

## 2019-02-28 ENCOUNTER — Other Ambulatory Visit: Payer: Self-pay | Admitting: Family Medicine

## 2019-03-26 ENCOUNTER — Emergency Department
Admission: EM | Admit: 2019-03-26 | Discharge: 2019-03-26 | Disposition: A | Discharge status: Home / Self Care | Payer: 59 | Attending: Emergency Medicine | Admitting: Emergency Medicine

## 2019-03-26 ENCOUNTER — Other Ambulatory Visit: Payer: Self-pay

## 2019-03-26 ENCOUNTER — Encounter: Payer: Self-pay | Admitting: Medical Oncology

## 2019-03-26 ENCOUNTER — Emergency Department: Payer: 59

## 2019-03-26 DIAGNOSIS — Z79899 Other long term (current) drug therapy: Secondary | ICD-10-CM | POA: Diagnosis not present

## 2019-03-26 DIAGNOSIS — F17210 Nicotine dependence, cigarettes, uncomplicated: Secondary | ICD-10-CM | POA: Diagnosis not present

## 2019-03-26 DIAGNOSIS — R110 Nausea: Secondary | ICD-10-CM | POA: Diagnosis not present

## 2019-03-26 DIAGNOSIS — R1011 Right upper quadrant pain: Secondary | ICD-10-CM | POA: Insufficient documentation

## 2019-03-26 DIAGNOSIS — I10 Essential (primary) hypertension: Secondary | ICD-10-CM | POA: Insufficient documentation

## 2019-03-26 DIAGNOSIS — K8020 Calculus of gallbladder without cholecystitis without obstruction: Secondary | ICD-10-CM | POA: Insufficient documentation

## 2019-03-26 DIAGNOSIS — R109 Unspecified abdominal pain: Secondary | ICD-10-CM | POA: Diagnosis present

## 2019-03-26 LAB — LIPASE, BLOOD: Lipase: 41 U/L (ref 11–51)

## 2019-03-26 LAB — COMPREHENSIVE METABOLIC PANEL
ALT: 17 U/L (ref 0–44)
AST: 17 U/L (ref 15–41)
Albumin: 3.9 g/dL (ref 3.5–5.0)
Alkaline Phosphatase: 82 U/L (ref 38–126)
Anion gap: 9 (ref 5–15)
BUN: 12 mg/dL (ref 6–20)
CO2: 25 mmol/L (ref 22–32)
Calcium: 9.1 mg/dL (ref 8.9–10.3)
Chloride: 105 mmol/L (ref 98–111)
Creatinine, Ser: 0.77 mg/dL (ref 0.44–1.00)
GFR calc Af Amer: >60 mL/min (ref >60)
GFR calc non Af Amer: >60 mL/min (ref >60)
Glucose, Bld: 116 mg/dL — ABNORMAL HIGH (ref 70–99)
Potassium: 4 mmol/L (ref 3.5–5.1)
Sodium: 139 mmol/L (ref 135–145)
Total Bilirubin: 0.6 mg/dL (ref 0.3–1.2)
Total Protein: 7.1 g/dL (ref 6.5–8.1)

## 2019-03-26 LAB — URINALYSIS, COMPLETE (UACMP) WITH MICROSCOPIC
Bilirubin Urine: NEGATIVE
Glucose, UA: NEGATIVE mg/dL
Hgb urine dipstick: NEGATIVE
Ketones, ur: NEGATIVE mg/dL
Leukocytes,Ua: NEGATIVE
Nitrite: NEGATIVE
Protein, ur: NEGATIVE mg/dL
Specific Gravity, Urine: 1.021 (ref 1.005–1.030)
pH: 5 (ref 5.0–8.0)

## 2019-03-26 LAB — CBC
HCT: 41.1 % (ref 36.0–46.0)
Hemoglobin: 13.5 g/dL (ref 12.0–15.0)
MCH: 29.2 pg (ref 26.0–34.0)
MCHC: 32.8 g/dL (ref 30.0–36.0)
MCV: 88.8 fL (ref 80.0–100.0)
Platelets: 326 10*3/uL (ref 150–400)
RBC: 4.63 MIL/uL (ref 3.87–5.11)
RDW: 14.6 % (ref 11.5–15.5)
WBC: 10.9 10*3/uL — ABNORMAL HIGH (ref 4.0–10.5)
nRBC: 0 % (ref 0.0–0.2)

## 2019-03-26 MED ORDER — ONDANSETRON 4 MG PO TBDP: 4.0000 mg | ORAL_TABLET | Freq: Three times a day (TID) | ORAL | 0 refills | Status: DC | PRN

## 2019-03-26 MED ORDER — FENTANYL CITRATE (PF) 100 MCG/2ML IJ SOLN
50.0000 ug | Freq: Once | INTRAMUSCULAR | Status: AC
  Administered 2019-03-26: 09:00:00 50 ug via INTRAVENOUS
  Filled 2019-03-26: qty 2

## 2019-03-26 MED ORDER — HYDROCODONE-ACETAMINOPHEN 5-325 MG PO TABS: 1.0000 | ORAL_TABLET | Freq: Four times a day (QID) | ORAL | 0 refills | Status: DC | PRN

## 2019-03-26 NOTE — ED Provider Notes (Signed)
[facility] Emergency Department Provider Note  ____________________________________________   First MD Initiated Contact with Patient 03/26/19 [phone]     (approximate)  I have reviewed the triage vital signs and the nursing notes.   HISTORY  Chief Complaint Abdominal Pain, Flank Pain, and Nausea   HPI Susan Snyder is a 54 y.o. female presents to the ED with complaint of right upper quadrant pain radiating to her back that started yesterday when she was taking the Christmas tree down.  She states that the pain got worse during the night.  She also has had some nausea and indigestive type symptoms.  She denies any injury.  She has also had some right flank/right shoulder discomfort for the last 2 days but no urinary symptoms, fever, chills,  or vomiting.  Patient does report nausea.  She rates her pain as an 8 out of 10.      Past Medical History:  Diagnosis Date  . Chronic diarrhea   . Chronic gastritis Sept. 2015   on EGD   . Collagenous colitis    colonoscopy biopsies 11/01/13  . Hyperplastic polyps of stomach    Aug 2015  . Hypertension   . Neoplasm of uncertain behavior of skin 05/20/2016   Hx of skin cancer, BCC  . Renal cyst     Patient Active Problem List   Diagnosis Date Noted  . Acute appendicitis 08/20/2018  . Preventative health care 05/20/2016  . Neoplasm of uncertain behavior of skin 05/20/2016  . 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 Vitamin B12 deficiency 01/14/2015  . Vitamin D deficiency 01/14/2015  . Obesity 01/14/2015  . Hx of iron deficiency anemia 01/14/2015  . Frequent headaches 12/28/2014  . Abnormal finding on MRI of brain 12/28/2014  . Tobacco abuse 12/23/2014  . Stress reaction 12/23/2014  . Hypertension   . Renal cyst   . Collagenous colitis   . H/O bilateral hip replacements 06/28/2014  . Celiac disease 01/02/2014  . Primary localized osteoarthrosis, pelvic region and thigh 10/25/2013  . Cervical spondylosis 02/06/2013      Past Surgical History:  Procedure Laterality Date  . ABDOMINAL HYSTERECTOMY  2005   partial, still has ovaries, due to fibroids.  Marland Kitchen [place]   x 4  . COLONOSCOPY W/ BIOPSIES  11/01/13   collagenous colitis, sigmoid polypectomy x 2; hyperplastic polyps  . ESOPHAGOGASTRODUODENOSCOPY  11/22/13   chronic gastritis  . HIP ARTHROPLASTY Bilateral 01/15/14,03/05/14   total hip, Dr. Nilsa Nutting  . LAPAROSCOPIC APPENDECTOMY N/A 08/20/2018   Procedure: APPENDECTOMY LAPAROSCOPIC;  Surgeon: Fredirick Maudlin, MD;  Location: [facility] ORS;  Service: General;  Laterality: N/A;  . NECK SURGERY  2015    Prior to Admission medications   Medication Sig Start Date End Date Taking? Authorizing Provider  aspirin EC 81 MG tablet Take 1 tablet (81 mg total) by mouth daily. 04/13/18   Arnetha Courser, MD  atorvastatin (LIPITOR) 10 MG tablet TAKE 1 TABLET BY MOUTH EVERYDAY AT BEDTIME 02/23/19   Delsa Grana, PA-C  buPROPion (WELLBUTRIN XL) 150 MG 24 hr tablet Take 1 tablet (150 mg total) by mouth daily. 01/30/19   Delsa Grana, PA-C  HYDROcodone-acetaminophen (NORCO/VICODIN) 5-325 MG tablet Take 1 tablet by mouth every 6 (six) hours as needed for moderate pain. 03/26/19   Johnn Hai, PA-C  loperamide (IMODIUM) 2 MG capsule Take 2 mg by mouth as needed.     [provider]  metoprolol succinate (TOPROL-XL) 25 MG  24 hr tablet TAKE 1 TABLET BY MOUTH EVERY DAY**NEEDS OFFICE VISIT** 02/28/19   Hubbard Hartshorn, FNP  ondansetron (ZOFRAN ODT) 4 MG disintegrating tablet Take 1 tablet (4 mg total) by mouth every 8 (eight) hours as needed for nausea or vomiting. 03/26/19   Johnn Hai, PA-C  Vitamin D, Cholecalciferol, 25 MCG (1000 UT) TABS Take 1 tablet by mouth daily.    [provider]  Vitamin D, Ergocalciferol, (DRISDOL) 1.25 MG (50000 UT) CAPS capsule Take 1 capsule (50,000 Units total) by mouth every 7 (seven) days. x12 weeks. 01/31/19   Delsa Grana, PA-C     Allergies Adhesive [tape], Gabapentin, Keflex [cephalexin], Penicillins, and Sulfa antibiotics  Family History  Problem Relation Age of Onset  . Stroke Mother   . Glaucoma Mother   . Heart disease Father   . Cancer Father        prostate  . Heart disease Sister   . Diabetes Sister   . Diabetes Paternal Aunt   . Heart disease Paternal Aunt   . Bleeding Disorder Paternal Uncle   . Heart disease Sister        Psychologist, forensic  . Diabetes Paternal Aunt   . Heart disease Paternal Aunt     Social History Social History   Tobacco Use  . Smoking status: Current Every Day Smoker    Packs/day: 0.50    Types: Cigarettes  . Smokeless tobacco: Never Used  Substance Use Topics  . Alcohol use: No  . Drug use: No    Review of Systems Constitutional: No fever/chills Eyes: No visual changes. ENT: No sore throat. Cardiovascular: Denies chest pain. Respiratory: Denies shortness of breath. Gastrointestinal: Positive abdominal pain.  Positive nausea, no vomiting.  No diarrhea.  No constipation. Genitourinary: Negative for dysuria. Musculoskeletal: Negative for back pain. Skin: Negative for rash. Neurological: Negative for headaches, focal weakness or numbness. ___________________________________________   PHYSICAL EXAM:  VITAL SIGNS: ED Triage Vitals [03/26/19 0757]  Enc Vitals Group     BP (!) 155/77     Pulse Rate 78     Resp 16     Temp 98.2 F (36.8 C)     Temp Source Oral     SpO2 99 %     Weight 220 lb (99.8 kg)     Height 5' 9"  (1.753 m)     Head Circumference      Peak Flow      Pain Score 8     Pain Loc      Pain Edu?      Excl. in Opp?     Constitutional: Alert and oriented. Well appearing and in no acute distress. Eyes: Conjunctivae are normal.  Head: Atraumatic. Neck: No stridor.   Cardiovascular: Normal rate, regular rhythm. Grossly normal heart sounds.  Good peripheral circulation. Respiratory: Normal respiratory effort.  No retractions. Lungs  CTAB. Gastrointestinal: Soft with moderate right upper quadrant and epigastric tenderness.  Bowel sounds normoactive x4 quadrants. Musculoskeletal: Moves upper and lower extremities with any difficulty.  Normal gait was noted. Neurologic:  Normal speech and language. No gross focal neurologic deficits are appreciated. No gait instability. Skin:  Skin is warm, dry and intact. No rash noted. Psychiatric: Mood and affect are normal. Speech and behavior are normal.  ____________________________________________   LABS (all labs ordered are listed, but only abnormal results are displayed)  Labs Reviewed  COMPREHENSIVE METABOLIC PANEL - Abnormal; Notable for the following components:      Result Value  Glucose, Bld 116 (*)    All other components within normal limits  CBC - Abnormal; Notable for the following components:   WBC 10.9 (*)    All other components within normal limits  URINALYSIS, COMPLETE (UACMP) WITH MICROSCOPIC - Abnormal; Notable for the following components:   Color, Urine YELLOW (*)    APPearance HAZY (*)    Bacteria, UA RARE (*)    All other components within normal limits  LIPASE, BLOOD   RADIOLOGY   Official radiology report(s): US Abdomen Limited RUQ  Result Date: 03/26/2019 CLINICAL DATA:  Right upper quadrant abdominal pain for 2 days. EXAM: ULTRASOUND ABDOMEN LIMITED RIGHT UPPER QUADRANT COMPARISON:  08/20/2018 CT abdomen/pelvis. FINDINGS: Gallbladder: Multiple shadowing calcified gallstones in the nondistended gallbladder, largest 1.9 cm. No gallbladder wall thickening. No sonographic Murphy's sign reported. No pericholecystic fluid. Common bile duct: Diameter: 3 mm Liver: No focal lesion identified. Within normal limits in parenchymal echogenicity. Portal vein is patent on color Doppler imaging with normal direction of blood flow towards the liver. Other: None. IMPRESSION: 1. Cholelithiasis.  No evidence of acute cholecystitis. 2. No biliary ductal dilatation. 3.  Normal liver. Electronically Signed   By: Ilona Sorrel M.D.   On: 03/26/2019 10:22    ____________________________________________   PROCEDURES  Procedure(s) performed (including Critical Care):  Procedures   ____________________________________________   INITIAL IMPRESSION / ASSESSMENT AND PLAN / ED COURSE  As part of my medical decision making, I reviewed the following data within the electronic MEDICAL RECORD NUMBER Notes from prior ED visits and [place] Controlled Substance Database  54 year old female presents to the ED with complaint of right upper quadrant pain radiating through to her right shoulder.  She states that initially she thought that she had a shoulder injury due to taking the Christmas tree down.  She now has some nausea without vomiting.  Urinalysis sent lab work was reassuring.  Ultrasound of the abdomen is positive for multiple small stones within the gallbladder.  Patient was made aware.  We discussed consulting a surgeon and changing her diet to prevent a another attack.  Patient is aware that if she becomes febrile, irretractable nausea and vomiting or worsening of her pain she is to return to the emergency department.  Patient was discharged with a prescription for Zofran and Norco and given information about the surgeon on call.  ____________________________________________   FINAL CLINICAL IMPRESSION(S) / ED DIAGNOSES  Final diagnoses:  Calculus of gallbladder without cholecystitis without obstruction     ED Discharge Orders         Ordered    HYDROcodone-acetaminophen (NORCO/VICODIN) 5-325 MG tablet  Every 6 hours PRN     03/26/19 1045    ondansetron (ZOFRAN ODT) 4 MG disintegrating tablet  Every 8 hours PRN     03/26/19 1045           Note:  This document was prepared using Dragon voice recognition software and [employer] include unintentional dictation errors.    Johnn Hai, PA-C 03/26/19 1518    Arta Silence, MD 03/26/19 1529

## 2019-03-26 NOTE — ED Notes (Signed)
See triage note  Presents with pain to right mid back pain and abd pain  States pain started while taking down the Christmas tree yesterday    States pain is radiating up back  Did have some nausea

## 2019-03-26 NOTE — ED Triage Notes (Signed)
Pt ambulatory to triage with reports of RUQ abd pain that radiates around to rt flank pain x 2 days. Pt reports nausea. Denies fever, denies urinary sx's.

## 2019-03-26 NOTE — Discharge Instructions (Signed)
Follow-up with the surgeon listed on your discharge papers.  You have multiple gallstones on your ultrasound today.  Also stay with a low-fat diet to prevent an acute episode with increased pain.  For today a prescription for pain medication and nausea was sent to your pharmacy.  Do not drive or operate machinery while taking the pain medication.

## 2019-04-03 ENCOUNTER — Other Ambulatory Visit: Payer: Self-pay

## 2019-04-03 ENCOUNTER — Ambulatory Visit (INDEPENDENT_AMBULATORY_CARE_PROVIDER_SITE_OTHER): Payer: 59 | Admitting: Surgery

## 2019-04-03 ENCOUNTER — Ambulatory Visit: Payer: Self-pay | Admitting: Surgery

## 2019-04-03 ENCOUNTER — Encounter: Payer: Self-pay | Admitting: Surgery

## 2019-04-03 VITALS — BP 135/83 | HR 94 | Temp 97.5°F | Resp 12 | Ht 68.0 in | Wt 223.2 lb

## 2019-04-03 DIAGNOSIS — K8010 Calculus of gallbladder with chronic cholecystitis without obstruction: Secondary | ICD-10-CM | POA: Diagnosis not present

## 2019-04-03 DIAGNOSIS — Z9049 Acquired absence of other specified parts of digestive tract: Secondary | ICD-10-CM | POA: Diagnosis not present

## 2019-04-03 HISTORY — DX: Calculus of gallbladder with chronic cholecystitis without obstruction: K80.10

## 2019-04-03 NOTE — Progress Notes (Signed)
[name], female   DOB: Feb 09, 1966, 54 y.o.   MRN: [id number]  Chief Complaint: Right-sided back pain.  History of Present Illness Susan Snyder is a 54 y.o. female with a progressive history of right-sided back pain, becoming intolerable at a level of 6 out of 10 for which she presented to the emergency department on January 4.  No known association with her dietary intake, though she does report nausea.  She denies fevers and chills.  Reports right-sided pain that is primarily in the back.  No history of jaundice or hepatitis.  Although she has irritable bowel changes she denies any loss of coloration, though she has had some diarrheic stools.  She denies any melena or hematochezia.  She has abdominal pain lingering and intolerable.  She has been instructed by myself and she is being quite restricted with her diet at present which she is tolerating. Due to insurance card issues, she did not get her hydrocodone filled, but has recently obtained.  Past Medical History Past Medical History:  Diagnosis Date  . Chronic diarrhea   . Chronic gastritis Sept. 2015   on EGD   . Collagenous colitis    colonoscopy biopsies 11/01/13  . Hyperplastic polyps of stomach    Aug 2015  . Hypertension   . Neoplasm of uncertain behavior of skin 05/20/2016   Hx of skin cancer, BCC  . Renal cyst       Past Surgical History:  Procedure Laterality Date  . ABDOMINAL HYSTERECTOMY  2005   partial, still has ovaries, due to fibroids.  . APPENDECTOMY    . [place]   x 4  . COLONOSCOPY W/ BIOPSIES  11/01/13   collagenous colitis, sigmoid polypectomy x 2; hyperplastic polyps  . ESOPHAGOGASTRODUODENOSCOPY  11/22/13   chronic gastritis  . HIP ARTHROPLASTY Bilateral 01/15/14,03/05/14   total hip, Dr. Nilsa Nutting  . LAPAROSCOPIC APPENDECTOMY N/A 08/20/2018   Procedure: APPENDECTOMY LAPAROSCOPIC;  Surgeon: Fredirick Maudlin, MD;  Location: [facility] ORS;  Service: General;  Laterality: N/A;   . NECK SURGERY  2015    Allergies  Allergen Reactions  . Adhesive [Tape]   . Gabapentin Nausea Only    Higher Dose causes vomiting Higher Dose causes vomiting  . Keflex [Cephalexin]   . Penicillins   . Sulfa Antibiotics     Current Outpatient Medications  Medication Sig Dispense Refill  . aspirin EC 81 MG tablet Take 1 tablet (81 mg total) by mouth daily.    Marland Kitchen atorvastatin (LIPITOR) 10 MG tablet TAKE 1 TABLET BY MOUTH EVERYDAY AT BEDTIME 90 tablet 1  . buPROPion (WELLBUTRIN XL) 150 MG 24 hr tablet Take 1 tablet (150 mg total) by mouth daily. 180 tablet 1  . HYDROcodone-acetaminophen (NORCO/VICODIN) 5-325 MG tablet Take 1 tablet by mouth every 6 (six) hours as needed for moderate pain. 15 tablet 0  . loperamide (IMODIUM) 2 MG capsule Take 2 mg by mouth as needed.     . metoprolol succinate (TOPROL-XL) 25 MG 24 hr tablet TAKE 1 TABLET BY MOUTH EVERY DAY**NEEDS OFFICE VISIT** 90 tablet 1  . Vitamin D, Cholecalciferol, 25 MCG (1000 UT) TABS Take 1 tablet by mouth daily.    . Vitamin D, Ergocalciferol, (DRISDOL) 1.25 MG (50000 UT) CAPS capsule Take 1 capsule (50,000 Units total) by mouth every 7 (seven) days. x12 weeks. 12 capsule 0   No current facility-administered medications for this visit.    Family History Family History  Problem  Relation Age of Onset  . Stroke Mother   . Glaucoma Mother   . Heart disease Father   . Cancer Father        prostate  . Heart disease Sister   . Diabetes Sister   . Diabetes Paternal Aunt   . Heart disease Paternal Aunt   . Bleeding Disorder Paternal Uncle   . Heart disease Sister        Psychologist, forensic  . Diabetes Paternal Aunt   . Heart disease Paternal Aunt       Social History Social History   Tobacco Use  . Smoking status: Current Every Day Smoker    Packs/day: 0.50    Types: Cigarettes  . Smokeless tobacco: Never Used  Substance Use Topics  . Alcohol use: No  . Drug use: No        Review of Systems  Constitutional:  Negative for chills and fever.  HENT: Negative.   Eyes: Negative.   Respiratory: Negative.   Cardiovascular: Negative.   Gastrointestinal: Positive for abdominal pain, constipation, diarrhea and nausea. Negative for blood in stool and melena.  Genitourinary: Positive for flank pain.  Skin: Negative.   Neurological: Negative.   Endo/Heme/Allergies: Negative.       Physical Exam Blood pressure 135/83, pulse 94, temperature (!) 97.5 F (36.4 C), temperature source Temporal, resp. rate 12, height 5' 8"  (1.727 m), weight 223 lb 3.2 oz (101.2 kg), SpO2 98 %. Last Weight  Most recent update: 04/03/2019  9:07 AM   Weight  101.2 kg (223 lb 3.2 oz)            CONSTITUTIONAL: Well developed, and nourished, appropriately responsive and aware without distress.   EYES: Sclera non-icteric.   EARS, NOSE, MOUTH AND THROAT: Mask worn.     Hearing is intact to voice.  NECK: Trachea is midline, and there is no jugular venous distension.  LYMPH NODES:  Lymph nodes in the neck are not enlarged. RESPIRATORY:  Lungs are clear, and breath sounds are equal bilaterally. Normal respiratory effort without pathologic use of accessory muscles. CARDIOVASCULAR: Heart is regular in rate and rhythm. GI: The abdomen is soft, nontender, and nondistended. There were no palpable masses. I did not appreciate hepatosplenomegaly. MUSCULOSKELETAL:  Symmetrical muscle tone appreciated in all four extremities.    SKIN: Skin turgor is normal. No pathologic skin lesions appreciated.  NEUROLOGIC:  Motor and sensation appear grossly normal.  Cranial nerves are grossly without defect. PSYCH:  Alert and oriented to person, place and time. Affect is appropriate for situation.  Data Reviewed I have personally reviewed what is currently available of the patient's imaging, recent labs and medical records.   CBC:  Lab Results  Component Value Date   WBC 10.9 (H) 03/26/2019   RBC 4.63 03/26/2019   Radiology review: LFTs are all  within normal limits.  Ultrasound reveals 1.9 cm gallstones, calcified.  No ductal dilatation, pericholecystic fluid or gallbladder wall thickening   Assessment    Chronic calculus cholecystitis Patient Active Problem List   Diagnosis Date Noted  . Acute appendicitis 08/20/2018  . Preventative health care 05/20/2016  . Neoplasm of uncertain behavior of skin 05/20/2016  . 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 Vitamin B12 deficiency 01/14/2015  . Vitamin D deficiency 01/14/2015  . Obesity 01/14/2015  . Hx of iron deficiency anemia 01/14/2015  . Frequent headaches 12/28/2014  . Abnormal finding on MRI of brain 12/28/2014  . Tobacco abuse 12/23/2014  . Stress reaction 12/23/2014  .  Hypertension   . Renal cyst   . Collagenous colitis   . H/O bilateral hip replacements 06/28/2014  . Celiac disease 01/02/2014  . Primary localized osteoarthrosis, pelvic region and thigh 10/25/2013  . Cervical spondylosis 02/06/2013    Plan    Robotic assisted laparoscopic cholecystectomy. The risks, benefits, complications, treatment options, and expected outcomes were discussed with the patient. The possibilities of bleeding, recurrent infection, finding a normal gallbladder, perforation of viscus organs, damage to surrounding structures, bile leak, abscess formation, needing a drain placed, the need for additional procedures, reaction to medication, pulmonary aspiration,  failure to diagnose a condition, the possible need to convert to an open procedure, and creating a complication requiring transfusion or operation were discussed with the patient. The patient and/or family concurred with the proposed plan, giving informed consent.   Face-to-face time spent with the patient and accompanying care providers(if present) was 30 minutes, with more than 50% of the time spent counseling, educating, and coordinating care of the patient.      Ronny Bacon M.D., FACS 04/03/2019, 9:58 AM

## 2019-04-03 NOTE — Patient Instructions (Addendum)
Continue to avoid Fatty Foods.  Our Surgery Scheduler will contact you within 24-48 hours to schedule your surgery. Please have the [facility] Sheet available when she calls you.   Laparoscopic Cholecystectomy Laparoscopic cholecystectomy is surgery to remove the gallbladder. The gallbladder is a pear-shaped organ that lies beneath the liver on the right side of the body. The gallbladder stores bile, which is a fluid that helps the body to digest fats. Cholecystectomy is often done for inflammation of the gallbladder (cholecystitis). This condition is usually caused by a buildup of gallstones (cholelithiasis) in the gallbladder. Gallstones can block the flow of bile, which can result in inflammation and pain. In severe cases, emergency surgery Pring be required. This procedure is done though small incisions in your abdomen (laparoscopic surgery). A thin scope with a camera (laparoscope) is inserted through one incision. Thin surgical instruments are inserted through the other incisions. In some cases, a laparoscopic procedure Madril be turned into a type of surgery that is done through a larger incision (open surgery). Tell a health care provider about:  Any allergies you have.  All medicines you are taking, including vitamins, herbs, eye drops, creams, and over-the-counter medicines.  Any problems you or family members have had with anesthetic medicines.  Any blood disorders you have.  Any surgeries you have had.  Any medical conditions you have.  Whether you are pregnant or Suder be pregnant. What are the risks? Generally, this is a safe procedure. However, problems Buffkin occur, including:  Infection.  Bleeding.  Allergic reactions to medicines.  Damage to other structures or organs.  A stone remaining in the common bile duct. The common bile duct carries bile from the gallbladder into the small intestine.  A bile leak from the cyst duct that is clipped when your gallbladder is removed. What  happens before the procedure? Staying hydrated Follow instructions from your health care provider about hydration, which Spillman include:  Up to 2 hours before the procedure - you Kulig continue to drink clear liquids, such as water, clear fruit juice, black coffee, and plain tea. Eating and drinking restrictions Follow instructions from your health care provider about eating and drinking, which Schuchard include:  8 hours before the procedure - stop eating heavy meals or foods such as meat, fried foods, or fatty foods.  6 hours before the procedure - stop eating light meals or foods, such as toast or cereal.  6 hours before the procedure - stop drinking milk or drinks that contain milk.  2 hours before the procedure - stop drinking clear liquids. Medicines  Ask your health care provider about: ? Changing or stopping your regular medicines. This is especially important if you are taking diabetes medicines or blood thinners. ? Taking medicines such as aspirin and ibuprofen. These medicines can thin your blood. Do not take these medicines before your procedure if your health care provider instructs you not to.  You Helfman be given antibiotic medicine to help prevent infection. General instructions  Let your health care provider know if you develop a cold or an infection before surgery.  Plan to have someone take you home from the hospital or clinic.  Ask your health care provider how your surgical site will be marked or identified. What happens during the procedure?   To reduce your risk of infection: ? Your health care team will wash or sanitize their hands. ? Your skin will be washed with soap. ? Hair Sizemore be removed from the surgical area.  An  IV tube Peets be inserted into one of your veins.  You will be given one or more of the following: ? A medicine to help you relax (sedative). ? A medicine to make you fall asleep (general anesthetic).  A breathing tube will be placed in your mouth.   Your surgeon will make several small cuts (incisions) in your abdomen.  The laparoscope will be inserted through one of the small incisions. The camera on the laparoscope will send images to a TV screen (monitor) in the operating room. This lets your surgeon see inside your abdomen.  Air-like gas will be pumped into your abdomen. This will expand your abdomen to give the surgeon more room to perform the surgery.  Other tools that are needed for the procedure will be inserted through the other incisions. The gallbladder will be removed through one of the incisions.  Your common bile duct Ryden be examined. If stones are found in the common bile duct, they Herard be removed.  After your gallbladder has been removed, the incisions will be closed with stitches (sutures), staples, or skin glue.  Your incisions Frederic be covered with a bandage (dressing). The procedure Fleury vary among health care providers and hospitals. What happens after the procedure?  Your blood pressure, heart rate, breathing rate, and blood oxygen level will be monitored until the medicines you were given have worn off.  You will be given medicines as needed to control your pain.  Do not drive for 24 hours if you were given a sedative. This information is not intended to replace advice given to you by your health care provider. Make sure you discuss any questions you have with your health care provider. Document Revised: 02/18/2017 Document Reviewed: 08/25/2015 Elsevier Patient Education  [employer].

## 2019-04-03 NOTE — H&P (View-Only) (Signed)
[name], female   DOB: 12/09/1965, 54 y.o.   MRN: [id number]  Chief Complaint: Right-sided back pain.  History of Present Illness Susan Snyder is a 54 y.o. female with a progressive history of right-sided back pain, becoming intolerable at a level of 6 out of 10 for which she presented to the emergency department on January 4.  No known association with her dietary intake, though she does report nausea.  She denies fevers and chills.  Reports right-sided pain that is primarily in the back.  No history of jaundice or hepatitis.  Although she has irritable bowel changes she denies any loss of coloration, though she has had some diarrheic stools.  She denies any melena or hematochezia.  She has abdominal pain lingering and intolerable.  She has been instructed by myself and she is being quite restricted with her diet at present which she is tolerating. Due to insurance card issues, she did not get her hydrocodone filled, but has recently obtained.  Past Medical History Past Medical History:  Diagnosis Date  . Chronic diarrhea   . Chronic gastritis Sept. 2015   on EGD   . Collagenous colitis    colonoscopy biopsies 11/01/13  . Hyperplastic polyps of stomach    Aug 2015  . Hypertension   . Neoplasm of uncertain behavior of skin 05/20/2016   Hx of skin cancer, BCC  . Renal cyst       Past Surgical History:  Procedure Laterality Date  . ABDOMINAL HYSTERECTOMY  2005   partial, still has ovaries, due to fibroids.  . APPENDECTOMY    . [place]   x 4  . COLONOSCOPY W/ BIOPSIES  11/01/13   collagenous colitis, sigmoid polypectomy x 2; hyperplastic polyps  . ESOPHAGOGASTRODUODENOSCOPY  11/22/13   chronic gastritis  . HIP ARTHROPLASTY Bilateral 01/15/14,03/05/14   total hip, Dr. Nilsa Nutting  . LAPAROSCOPIC APPENDECTOMY N/A 08/20/2018   Procedure: APPENDECTOMY LAPAROSCOPIC;  Surgeon: Fredirick Maudlin, MD;  Location: [facility] ORS;  Service: General;  Laterality: N/A;   . NECK SURGERY  2015    Allergies  Allergen Reactions  . Adhesive [Tape]   . Gabapentin Nausea Only    Higher Dose causes vomiting Higher Dose causes vomiting  . Keflex [Cephalexin]   . Penicillins   . Sulfa Antibiotics     Current Outpatient Medications  Medication Sig Dispense Refill  . aspirin EC 81 MG tablet Take 1 tablet (81 mg total) by mouth daily.    Marland Kitchen atorvastatin (LIPITOR) 10 MG tablet TAKE 1 TABLET BY MOUTH EVERYDAY AT BEDTIME 90 tablet 1  . buPROPion (WELLBUTRIN XL) 150 MG 24 hr tablet Take 1 tablet (150 mg total) by mouth daily. 180 tablet 1  . HYDROcodone-acetaminophen (NORCO/VICODIN) 5-325 MG tablet Take 1 tablet by mouth every 6 (six) hours as needed for moderate pain. 15 tablet 0  . loperamide (IMODIUM) 2 MG capsule Take 2 mg by mouth as needed.     . metoprolol succinate (TOPROL-XL) 25 MG 24 hr tablet TAKE 1 TABLET BY MOUTH EVERY DAY**NEEDS OFFICE VISIT** 90 tablet 1  . Vitamin D, Cholecalciferol, 25 MCG (1000 UT) TABS Take 1 tablet by mouth daily.    . Vitamin D, Ergocalciferol, (DRISDOL) 1.25 MG (50000 UT) CAPS capsule Take 1 capsule (50,000 Units total) by mouth every 7 (seven) days. x12 weeks. 12 capsule 0   No current facility-administered medications for this visit.    Family History Family History  Problem  Relation Age of Onset  . Stroke Mother   . Glaucoma Mother   . Heart disease Father   . Cancer Father        prostate  . Heart disease Sister   . Diabetes Sister   . Diabetes Paternal Aunt   . Heart disease Paternal Aunt   . Bleeding Disorder Paternal Uncle   . Heart disease Sister        Psychologist, forensic  . Diabetes Paternal Aunt   . Heart disease Paternal Aunt       Social History Social History   Tobacco Use  . Smoking status: Current Every Day Smoker    Packs/day: 0.50    Types: Cigarettes  . Smokeless tobacco: Never Used  Substance Use Topics  . Alcohol use: No  . Drug use: No        Review of Systems  Constitutional:  Negative for chills and fever.  HENT: Negative.   Eyes: Negative.   Respiratory: Negative.   Cardiovascular: Negative.   Gastrointestinal: Positive for abdominal pain, constipation, diarrhea and nausea. Negative for blood in stool and melena.  Genitourinary: Positive for flank pain.  Skin: Negative.   Neurological: Negative.   Endo/Heme/Allergies: Negative.       Physical Exam Blood pressure 135/83, pulse 94, temperature (!) 97.5 F (36.4 C), temperature source Temporal, resp. rate 12, height 5' 8"  (1.727 m), weight 223 lb 3.2 oz (101.2 kg), SpO2 98 %. Last Weight  Most recent update: 04/03/2019  9:07 AM   Weight  101.2 kg (223 lb 3.2 oz)            CONSTITUTIONAL: Well developed, and nourished, appropriately responsive and aware without distress.   EYES: Sclera non-icteric.   EARS, NOSE, MOUTH AND THROAT: Mask worn.     Hearing is intact to voice.  NECK: Trachea is midline, and there is no jugular venous distension.  LYMPH NODES:  Lymph nodes in the neck are not enlarged. RESPIRATORY:  Lungs are clear, and breath sounds are equal bilaterally. Normal respiratory effort without pathologic use of accessory muscles. CARDIOVASCULAR: Heart is regular in rate and rhythm. GI: The abdomen is soft, nontender, and nondistended. There were no palpable masses. I did not appreciate hepatosplenomegaly. MUSCULOSKELETAL:  Symmetrical muscle tone appreciated in all four extremities.    SKIN: Skin turgor is normal. No pathologic skin lesions appreciated.  NEUROLOGIC:  Motor and sensation appear grossly normal.  Cranial nerves are grossly without defect. PSYCH:  Alert and oriented to person, place and time. Affect is appropriate for situation.  Data Reviewed I have personally reviewed what is currently available of the patient's imaging, recent labs and medical records.   CBC:  Lab Results  Component Value Date   WBC 10.9 (H) 03/26/2019   RBC 4.63 03/26/2019   Radiology review: LFTs are all  within normal limits.  Ultrasound reveals 1.9 cm gallstones, calcified.  No ductal dilatation, pericholecystic fluid or gallbladder wall thickening   Assessment    Chronic calculus cholecystitis Patient Active Problem List   Diagnosis Date Noted  . Acute appendicitis 08/20/2018  . Preventative health care 05/20/2016  . Neoplasm of uncertain behavior of skin 05/20/2016  . 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 Vitamin B12 deficiency 01/14/2015  . Vitamin D deficiency 01/14/2015  . Obesity 01/14/2015  . Hx of iron deficiency anemia 01/14/2015  . Frequent headaches 12/28/2014  . Abnormal finding on MRI of brain 12/28/2014  . Tobacco abuse 12/23/2014  . Stress reaction 12/23/2014  .  Hypertension   . Renal cyst   . Collagenous colitis   . H/O bilateral hip replacements 06/28/2014  . Celiac disease 01/02/2014  . Primary localized osteoarthrosis, pelvic region and thigh 10/25/2013  . Cervical spondylosis 02/06/2013    Plan    Robotic assisted laparoscopic cholecystectomy. The risks, benefits, complications, treatment options, and expected outcomes were discussed with the patient. The possibilities of bleeding, recurrent infection, finding a normal gallbladder, perforation of viscus organs, damage to surrounding structures, bile leak, abscess formation, needing a drain placed, the need for additional procedures, reaction to medication, pulmonary aspiration,  failure to diagnose a condition, the possible need to convert to an open procedure, and creating a complication requiring transfusion or operation were discussed with the patient. The patient and/or family concurred with the proposed plan, giving informed consent.   Face-to-face time spent with the patient and accompanying care providers(if present) was 30 minutes, with more than 50% of the time spent counseling, educating, and coordinating care of the patient.      Ronny Bacon M.D., FACS 04/03/2019, 9:58 AM

## 2019-04-06 ENCOUNTER — Telehealth: Payer: Self-pay | Admitting: Surgery

## 2019-04-06 NOTE — Telephone Encounter (Signed)
Outbound call made to patient & message left on voicemail requesting a call back to advise of pre admission date/time, Covid Testing date and Surgery date.  Surgery Date: 04/11/19 Preadmission Testing Date: 04/09/19 (pm) Covid Testing Date: 04/09/19 - patient advised to go to the [facility] ([place])  Patient should be made aware to call [phone], between 1-3:00pm the day before surgery, to find out what time to arrive, as indicated on the blue sheet provided @ her office visit.

## 2019-04-09 ENCOUNTER — Other Ambulatory Visit: Payer: Self-pay

## 2019-04-09 ENCOUNTER — Encounter
Admission: RE | Admit: 2019-04-09 | Discharge: 2019-04-09 | Disposition: A | Discharge status: Home / Self Care | Payer: 59 | Source: Ambulatory Visit | Attending: Surgery | Admitting: Surgery

## 2019-04-09 HISTORY — DX: Nausea with vomiting, unspecified: R11.2

## 2019-04-09 HISTORY — DX: Other complications of anesthesia, initial encounter: T88.59XA

## 2019-04-09 HISTORY — DX: Other specified postprocedural states: Z98.890

## 2019-04-09 HISTORY — DX: Personal history of urinary calculi: Z87.442

## 2019-04-09 HISTORY — DX: Gastro-esophageal reflux disease without esophagitis: K21.9

## 2019-04-09 NOTE — Patient Instructions (Signed)
Your procedure is scheduled on: Wednesday 04/11/19.  Report to DAY SURGERY DEPARTMENT LOCATED ON 2ND FLOOR MEDICAL MALL ENTRANCE. To find out your arrival time please call [phone] between 1PM - 3PM on Tuesday 04/09/18.   Remember: Instructions that are not followed completely Araiza result in serious medical risk, up to and including death, or upon the discretion of your surgeon and anesthesiologist your surgery Sweeting need to be rescheduled.      _X__ 1. Do not eat food after midnight the night before your procedure.                 No gum chewing or hard candies. You Kvamme drink clear liquids up to 2 hours                 before you are scheduled to arrive for your surgery- DO NOT drink clear                 liquids within 2 hours of the start of your surgery.                 Clear Liquids include:  water, apple juice without pulp, clear carbohydrate                 drink such as Clearfast or Gatorade, Black Coffee or Tea (Do not add                 anything to coffee or tea).   __X__2.  On the morning of surgery brush your teeth with toothpaste and water, you Masoud rinse your mouth with mouthwash if you wish.  Do not swallow any toothpaste or mouthwash.      _X__ 3.  No Alcohol for 24 hours before or after surgery.    _X__ 4.  Do Not Smoke or use e-cigarettes For 24 Hours Prior to Your Surgery.                 Do not use any chewable tobacco products for at least 6 hours prior to                 surgery.   __X__5.  Notify your doctor if there is any change in your medical condition      (cold, fever, infections).      Do not wear jewelry, make-up, hairpins, clips or nail polish. Do not wear lotions, powders, or perfumes.  Do not shave 48 hours prior to surgery. Men Monks shave face and neck. Do not bring valuables to the hospital.     [facility] is not responsible for any belongings or valuables.   Contacts, dentures/partials or body piercings Vaux not be worn into surgery.  Bring a case for your contacts, glasses or hearing aids, a denture cup will be supplied. Leave your suitcase in the car. After surgery it Mccallum be brought to your room. For patients admitted to the hospital, discharge time is determined by your treatment team.    Patients discharged the day of surgery will not be allowed to drive home.      __X__ Take these medicines the morning of surgery with A SIP OF WATER:     1. buPROPion (WELLBUTRIN XL) 300 MG 24 hr tablet  2. ondansetron (ZOFRAN-ODT) 4 MG disintegrating tablet if needed  3. loperamide (IMODIUM) 2 MG capsule if needed     __X__ Use CHG Soap as directed     __X__ Stop Blood Thinners Aspirin 3 days prior  to your procedure.   __X__ Stop Anti-inflammatories 7 days before surgery such as Advil, Ibuprofen, Motrin, BC or Goodies Powder, Naprosyn, Naproxen, Aleve, Aspirin, Meloxicam. Jalomo take Tylenol if needed for pain or discomfort.    __X__ Don't start taking any new herbal supplements before your procedure.

## 2019-04-10 ENCOUNTER — Other Ambulatory Visit
Admission: RE | Admit: 2019-04-10 | Discharge: 2019-04-10 | Disposition: A | Discharge status: Home / Self Care | Payer: 59 | Source: Ambulatory Visit | Attending: Surgery | Admitting: Surgery

## 2019-04-10 DIAGNOSIS — Z20822 Contact with and (suspected) exposure to covid-19: Secondary | ICD-10-CM | POA: Insufficient documentation

## 2019-04-10 DIAGNOSIS — Z01812 Encounter for preprocedural laboratory examination: Secondary | ICD-10-CM | POA: Diagnosis present

## 2019-04-10 LAB — SARS CORONAVIRUS 2 (TAT 6-24 HRS): SARS Coronavirus 2: NEGATIVE

## 2019-04-10 MED ORDER — CIPROFLOXACIN IN D5W 400 MG/200ML IV SOLN
400.0000 mg | INTRAVENOUS | Status: AC
  Administered 2019-04-11: 400 mg via INTRAVENOUS

## 2019-04-10 MED ORDER — INDOCYANINE GREEN 25 MG IV SOLR
1.2500 mg | Freq: Once | INTRAVENOUS | Status: AC
  Administered 2019-04-11: 1.25 mg via INTRAVENOUS
  Filled 2019-04-10: qty 1.25
  Filled 2019-04-10: qty 25

## 2019-04-11 ENCOUNTER — Ambulatory Visit: Payer: 59 | Admitting: Certified Registered Nurse Anesthetist

## 2019-04-11 ENCOUNTER — Other Ambulatory Visit: Payer: Self-pay

## 2019-04-11 ENCOUNTER — Encounter: Payer: Self-pay | Admitting: Surgery

## 2019-04-11 ENCOUNTER — Encounter
Admission: RE | Disposition: A | Discharge status: Home / Self Care | Payer: Self-pay | Source: Home / Self Care | Attending: Surgery

## 2019-04-11 ENCOUNTER — Ambulatory Visit
Admission: RE | Admit: 2019-04-11 | Discharge: 2019-04-11 | Disposition: A | Discharge status: Home / Self Care | Payer: 59 | Attending: Surgery | Admitting: Surgery

## 2019-04-11 DIAGNOSIS — Z7982 Long term (current) use of aspirin: Secondary | ICD-10-CM | POA: Diagnosis not present

## 2019-04-11 DIAGNOSIS — Z79899 Other long term (current) drug therapy: Secondary | ICD-10-CM | POA: Diagnosis not present

## 2019-04-11 DIAGNOSIS — Z85828 Personal history of other malignant neoplasm of skin: Secondary | ICD-10-CM | POA: Diagnosis not present

## 2019-04-11 DIAGNOSIS — F17210 Nicotine dependence, cigarettes, uncomplicated: Secondary | ICD-10-CM | POA: Insufficient documentation

## 2019-04-11 DIAGNOSIS — I10 Essential (primary) hypertension: Secondary | ICD-10-CM | POA: Diagnosis not present

## 2019-04-11 DIAGNOSIS — K8010 Calculus of gallbladder with chronic cholecystitis without obstruction: Secondary | ICD-10-CM | POA: Insufficient documentation

## 2019-04-11 HISTORY — PX: CHOLECYSTECTOMY: SHX55

## 2019-04-11 SURGERY — LAPAROSCOPIC CHOLECYSTECTOMY
Anesthesia: General

## 2019-04-11 MED ORDER — ONDANSETRON HCL 4 MG/2ML IJ SOLN
INTRAMUSCULAR | Status: AC
  Filled 2019-04-11: qty 2

## 2019-04-11 MED ORDER — LIDOCAINE HCL (PF) 2 % IJ SOLN
INTRAMUSCULAR | Status: AC
  Filled 2019-04-11: qty 10

## 2019-04-11 MED ORDER — SCOPOLAMINE 1 MG/3DAYS TD PT72
1.0000 | MEDICATED_PATCH | Freq: Once | TRANSDERMAL | Status: DC
  Administered 2019-04-11: 1.5 mg via TRANSDERMAL

## 2019-04-11 MED ORDER — KETOROLAC TROMETHAMINE 30 MG/ML IJ SOLN
INTRAMUSCULAR | Status: DC | PRN
  Administered 2019-04-11: 30 mg via INTRAVENOUS

## 2019-04-11 MED ORDER — DEXAMETHASONE SODIUM PHOSPHATE 10 MG/ML IJ SOLN
INTRAMUSCULAR | Status: AC
  Filled 2019-04-11: qty 1

## 2019-04-11 MED ORDER — CHLORHEXIDINE GLUCONATE CLOTH 2 % EX PADS
6.0000 | MEDICATED_PAD | Freq: Once | CUTANEOUS | Status: AC
  Administered 2019-04-11: 6 via TOPICAL

## 2019-04-11 MED ORDER — FENTANYL CITRATE (PF) 100 MCG/2ML IJ SOLN: 25.0000 ug | INTRAMUSCULAR | Status: DC | PRN

## 2019-04-11 MED ORDER — ONDANSETRON HCL 4 MG/2ML IJ SOLN
INTRAMUSCULAR | Status: DC | PRN
  Administered 2019-04-11: 4 mg via INTRAVENOUS

## 2019-04-11 MED ORDER — ROCURONIUM BROMIDE 50 MG/5ML IV SOLN
INTRAVENOUS | Status: AC
  Filled 2019-04-11: qty 1

## 2019-04-11 MED ORDER — FENTANYL CITRATE (PF) 100 MCG/2ML IJ SOLN
INTRAMUSCULAR | Status: AC
  Filled 2019-04-11: qty 2

## 2019-04-11 MED ORDER — CHLORHEXIDINE GLUCONATE CLOTH 2 % EX PADS: 6.0000 | MEDICATED_PAD | Freq: Once | CUTANEOUS | Status: DC

## 2019-04-11 MED ORDER — ACETAMINOPHEN 500 MG PO TABS
1000.0000 mg | ORAL_TABLET | ORAL | Status: AC
  Administered 2019-04-11: 1000 mg via ORAL

## 2019-04-11 MED ORDER — BUPIVACAINE HCL (PF) 0.25 % IJ SOLN
INTRAMUSCULAR | Status: AC
  Filled 2019-04-11: qty 30

## 2019-04-11 MED ORDER — DEXMEDETOMIDINE HCL 200 MCG/2ML IV SOLN
INTRAVENOUS | Status: DC | PRN
  Administered 2019-04-11: 8 ug via INTRAVENOUS

## 2019-04-11 MED ORDER — DEXAMETHASONE SODIUM PHOSPHATE 10 MG/ML IJ SOLN
INTRAMUSCULAR | Status: DC | PRN
  Administered 2019-04-11: 10 mg via INTRAVENOUS

## 2019-04-11 MED ORDER — PROPOFOL 10 MG/ML IV BOLUS
INTRAVENOUS | Status: DC | PRN
  Administered 2019-04-11: 200 mg via INTRAVENOUS

## 2019-04-11 MED ORDER — MIDAZOLAM HCL 2 MG/2ML IJ SOLN
INTRAMUSCULAR | Status: AC
  Filled 2019-04-11: qty 2

## 2019-04-11 MED ORDER — SUGAMMADEX SODIUM 500 MG/5ML IV SOLN
INTRAVENOUS | Status: DC | PRN
  Administered 2019-04-11: 400 mg via INTRAVENOUS

## 2019-04-11 MED ORDER — FAMOTIDINE 20 MG PO TABS
20.0000 mg | ORAL_TABLET | Freq: Once | ORAL | Status: AC
  Administered 2019-04-11: 20 mg via ORAL

## 2019-04-11 MED ORDER — LACTATED RINGERS IV SOLN: INTRAVENOUS | Status: DC

## 2019-04-11 MED ORDER — BUPIVACAINE-EPINEPHRINE 0.25% -1:200000 IJ SOLN: INTRAMUSCULAR | Status: DC | PRN

## 2019-04-11 MED ORDER — IBUPROFEN 800 MG PO TABS: 800.0000 mg | ORAL_TABLET | Freq: Three times a day (TID) | ORAL | 0 refills | Status: DC | PRN

## 2019-04-11 MED ORDER — EPINEPHRINE PF 1 MG/ML IJ SOLN
INTRAMUSCULAR | Status: AC
  Filled 2019-04-11: qty 1

## 2019-04-11 MED ORDER — LIDOCAINE HCL (CARDIAC) PF 100 MG/5ML IV SOSY
PREFILLED_SYRINGE | INTRAVENOUS | Status: DC | PRN
Start: 2019-04-11 — End: 2019-04-11
  Administered 2019-04-11: 100 mg via INTRAVENOUS

## 2019-04-11 MED ORDER — HYDROCODONE-ACETAMINOPHEN 5-325 MG PO TABS: 1.0000 | ORAL_TABLET | Freq: Four times a day (QID) | ORAL | 0 refills | Status: DC | PRN

## 2019-04-11 MED ORDER — BUPIVACAINE HCL 0.25 % IJ SOLN
INTRAMUSCULAR | Status: DC | PRN
  Administered 2019-04-11: 30 mL

## 2019-04-11 MED ORDER — MIDAZOLAM HCL 2 MG/2ML IJ SOLN
INTRAMUSCULAR | Status: DC | PRN
  Administered 2019-04-11: 2 mg via INTRAVENOUS

## 2019-04-11 MED ORDER — ROCURONIUM BROMIDE 100 MG/10ML IV SOLN
INTRAVENOUS | Status: DC | PRN
  Administered 2019-04-11 (×2): 10 mg via INTRAVENOUS
  Administered 2019-04-11: 40 mg via INTRAVENOUS

## 2019-04-11 MED ORDER — SUGAMMADEX SODIUM 200 MG/2ML IV SOLN
INTRAVENOUS | Status: AC
  Filled 2019-04-11: qty 4

## 2019-04-11 MED ORDER — PROMETHAZINE HCL 25 MG/ML IJ SOLN: 6.2500 mg | INTRAMUSCULAR | Status: DC | PRN

## 2019-04-11 MED ORDER — PROPOFOL 10 MG/ML IV BOLUS
INTRAVENOUS | Status: AC
  Filled 2019-04-11: qty 20

## 2019-04-11 MED ORDER — FENTANYL CITRATE (PF) 100 MCG/2ML IJ SOLN
INTRAMUSCULAR | Status: DC | PRN
  Administered 2019-04-11 (×4): 50 ug via INTRAVENOUS

## 2019-04-11 SURGICAL SUPPLY — 36 items
APPLIER CLIP 5 13 M/L LIGAMAX5 (MISCELLANEOUS) ×3
BLADE SURG SZ11 CARB STEEL (BLADE) ×1 IMPLANT
CANISTER SUCT 1200ML W/VALVE (MISCELLANEOUS) ×3 IMPLANT
CHLORAPREP W/TINT 26 (MISCELLANEOUS) ×3 IMPLANT
CLIP APPLIE 5 13 M/L LIGAMAX5 (MISCELLANEOUS) ×2 IMPLANT
COVER WAND RF STERILE (DRAPES) ×3 IMPLANT
DEFOGGER SCOPE WARMER CLEARIFY (MISCELLANEOUS) ×3 IMPLANT
DERMABOND ADVANCED (GAUZE/BANDAGES/DRESSINGS) ×1
DERMABOND ADVANCED .7 DNX12 (GAUZE/BANDAGES/DRESSINGS) ×2 IMPLANT
ELECT REM PT RETURN 9FT ADLT (ELECTROSURGICAL) ×3
ELECTRODE REM PT RTRN 9FT ADLT (ELECTROSURGICAL) ×2 IMPLANT
GLOVE ORTHO TXT STRL SZ7.5 (GLOVE) ×6 IMPLANT
GOWN STRL REUS W/ TWL LRG LVL3 (GOWN DISPOSABLE) ×6 IMPLANT
GOWN STRL REUS W/TWL LRG LVL3 (GOWN DISPOSABLE) ×3
GRASPER SUT TROCAR 14GX15 (MISCELLANEOUS) ×3 IMPLANT
IRRIGATION STRYKERFLOW (MISCELLANEOUS) ×2 IMPLANT
IRRIGATOR STRYKERFLOW (MISCELLANEOUS)
IV NS 1000ML (IV SOLUTION) ×1
IV NS 1000ML BAXH (IV SOLUTION) ×2 IMPLANT
KIT TURNOVER KIT A (KITS) ×3 IMPLANT
NEEDLE HYPO 22GX1.5 SAFETY (NEEDLE) ×3 IMPLANT
NEEDLE INSUFFLATION 150MM (ENDOMECHANICALS) ×3 IMPLANT
NS IRRIG 500ML POUR BTL (IV SOLUTION) ×3 IMPLANT
PACK LAP CHOLECYSTECTOMY (MISCELLANEOUS) ×3 IMPLANT
POUCH SPECIMEN RETRIEVAL 10MM (ENDOMECHANICALS) ×3 IMPLANT
SCISSORS METZENBAUM CVD 33 (INSTRUMENTS) ×3 IMPLANT
SET TRI-LUMEN FLTR TB AIRSEAL (TUBING) ×3 IMPLANT
SHEARS HARMONIC ACE PLUS 36CM (ENDOMECHANICALS) ×2 IMPLANT
SLEEVE ENDOPATH XCEL 5M (ENDOMECHANICALS) ×3 IMPLANT
SUT MNCRL 4-0 (SUTURE) ×2
SUT MNCRL 4-0 27XMFL (SUTURE) ×4
SUT VICRYL 0 AB UR-6 (SUTURE) ×3 IMPLANT
SUTURE MNCRL 4-0 27XMF (SUTURE) ×4 IMPLANT
TROCAR PORT AIRSEAL 5X120 (TROCAR) ×3 IMPLANT
TROCAR XCEL 12X100 BLDLESS (ENDOMECHANICALS) ×3 IMPLANT
TROCAR XCEL NON-BLD 5MMX100MML (ENDOMECHANICALS) ×3 IMPLANT

## 2019-04-11 NOTE — Anesthesia Procedure Notes (Signed)
Procedure Name: Intubation Date/Time: 04/11/2019 8:51 AM Performed by: Caryl Asp, CRNA Pre-anesthesia Checklist: Patient identified, Patient being monitored, Timeout performed, Emergency Drugs available and Suction available Patient Re-evaluated:Patient Re-evaluated prior to induction Oxygen Delivery Method: Circle system utilized Preoxygenation: Pre-oxygenation with 100% oxygen Induction Type: IV induction Ventilation: Mask ventilation without difficulty Laryngoscope Size: 3 and McGraph Grade View: Grade I Tube type: Oral Tube size: 7.0 mm Number of attempts: 1 Airway Equipment and Method: Stylet Placement Confirmation: ETT inserted through vocal cords under direct vision,  positive ETCO2 and breath sounds checked- equal and bilateral Secured at: 21 cm Tube secured with: Tape Dental Injury: Teeth and Oropharynx as per pre-operative assessment

## 2019-04-11 NOTE — Anesthesia Postprocedure Evaluation (Signed)
Anesthesia Post Note  Patient: Susan Snyder  Procedure(s) Performed: LAPAROSCOPIC CHOLECYSTECTOMY (N/A ) INDOCYANINE GREEN FLUORESCENCE IMAGING (ICG)  Patient location during evaluation: PACU Anesthesia Type: General Level of consciousness: awake and alert Pain management: pain level controlled Vital Signs Assessment: post-procedure vital signs reviewed and stable Respiratory status: spontaneous breathing, nonlabored ventilation, respiratory function stable and patient connected to nasal cannula oxygen Cardiovascular status: blood pressure returned to baseline and stable Postop Assessment: no apparent nausea or vomiting Anesthetic complications: no     Last Vitals:  Vitals:   04/11/19 1053 04/11/19 1101  BP: 104/62 135/77  Pulse: 64 67  Resp: 18 18  Temp:  (!) 36.2 C  SpO2: 94% 98%    Last Pain:  Vitals:   04/11/19 1101  TempSrc: Temporal  PainSc: 3                  Martha Clan

## 2019-04-11 NOTE — Op Note (Signed)
Laparoscopic Cholecystectomy  Pre-operative Diagnosis: Chronic calculus cholecystitis  Post-operative Diagnosis: Same  Procedure: Laparoscopic cholecystectomy with ICG/firefly imaging.  Surgeon: Ronny Bacon, M.D., FACS  Anesthesia: Gen. with endotracheal tube  Assistant: Jettie Booze, PAS.   Findings: Chronic calculus cholecystitis, well visualized cystic duct with ICG imaging.  Estimated Blood Loss: Minimal.         Drains: None         Specimens: Gallbladder           Complications: none  I used the various Procedure Details  The patient was seen again in the Holding Room. The benefits, complications, treatment options, and expected outcomes were discussed with the patient. The risks of bleeding, infection, recurrence of symptoms, failure to resolve symptoms, bile duct damage, bile duct leak, retained common bile duct stone, bowel injury, any of which could require further surgery and/or ERCP, stent, or papillotomy were reviewed with the patient. The likelihood of improving the patient's symptoms with return to their baseline status is good.  The patient and/or family concurred with the proposed plan, giving informed consent.  The patient was taken to Operating Room, identified as Susan Snyder and the procedure verified as Laparoscopic Cholecystectomy.  A Time Out was held and the above information confirmed.  Prior to the induction of general anesthesia, antibiotic prophylaxis was administered. VTE prophylaxis was in place. General endotracheal anesthesia was then administered and tolerated well. After the induction, the abdomen was prepped with Chloraprep and draped in the sterile fashion. The patient was positioned in the supine position. After local infiltration of quarter percent Marcaine with epinephrine, stab incision was made left upper quadrant.  Just below the costal margin approximately midclavicular line the Veress needle is passed with sensation of the layers to  penetrate the abdominal wall and into the peritoneum.  Saline drop test is confirmed peritoneal placement.  Insufflation is initiated with carbon dioxide to pressures of 15 mmHg. Cystic duct and Periumbilical local infiltration with quarter percent Marcaine with epinephrine is utilized.  Made a 5 mm incision on the right periumbilical site, utilizing a towel clip for countertraction, I advanced an optical 5 mm port under direct visualization into the peritoneal cavity.  Once the peritoneum was penetrated, insufflation was initiated.  The trocar was then advanced into the abdominal cavity under direct visualization. Pneumoperitoneum was then continued with CO2 at 14 mmHg or less and tolerated well without any adverse changes in the patient's vital signs.  Two 5-mm ports were placed in the right upper quadrant all under direct vision.  A 12 mm port is placed in the epigastrium off of midline.  All skin incisions  were infiltrated with a local anesthetic agent before making the incision and placing the trocars.   The patient was positioned  in reverse Trendelenburg, tilted slightly to the patient's left.  The gallbladder was identified, the fundus grasped and retracted cephalad. Adhesions were lysed bluntly. The infundibulum was grasped and retracted laterally, exposing the peritoneum overlying the triangle of Calot. This was then divided and exposed in a blunt fashion. An extended critical view of the cystic duct and cystic artery was obtained.  The cystic duct was clearly identified with ICG imaging or with irrigation and bluntly dissected.   Artery and duct were double clipped and divided.  The gallbladder was taken from the gallbladder fossa in a retrograde fashion with the electrocautery. The gallbladder was removed and placed in an Endocatch bag. The gallbladder and Endocatch sac were then removed through the  epigastric port site.  The epigastric port site was closed with interrumpted 0 Vicryl suture with  PMI under direct visualization. No bleeding, bile duct injury or leak, or bowel injury was noted. Pneumoperitoneum was released.  4-0 subcuticular Monocryl was used to close the skin. Dermabond was  applied.  The patient was then extubated and brought to the recovery room in stable condition. Sponge, lap, and needle counts were correct at closure and at the conclusion of the case.               Ronny Bacon, MD, FACS April 11, 2019 10:05 AM.

## 2019-04-11 NOTE — Discharge Instructions (Signed)
Cholelithiasis  Cholelithiasis is also called "gallstones." It is a kind of gallbladder disease. The gallbladder is an organ that stores a liquid (bile) that helps you digest fat. Gallstones Cullens not cause symptoms (Revelle be silent gallstones) until they cause a blockage, and then they can cause pain (gallbladder attack). Follow these instructions at home:  Take over-the-counter and prescription medicines only as told by your doctor.  Stay at a healthy weight.  Eat healthy foods. This includes: ? Eating fewer fatty foods, like fried foods. ? Eating fewer refined carbs (refined carbohydrates). Refined carbs are breads and grains that are highly processed, like white bread and white rice. Instead, choose whole grains like whole-wheat bread and brown rice. ? Eating more fiber. Almonds, fresh fruit, and beans are healthy sources of fiber.  Keep all follow-up visits as told by your doctor. This is important. Contact a doctor if:  You have sudden pain in the upper right side of your belly (abdomen). Pain might spread to your right shoulder or your chest. This Plocher be a sign of a gallbladder attack.  You feel sick to your stomach (are nauseous).  You throw up (vomit).  You have been diagnosed with gallstones that have no symptoms and you get: ? Belly pain. ? Discomfort, burning, or fullness in the upper part of your belly (indigestion). Get help right away if:  You have sudden pain in the upper right side of your belly, and it lasts for more than 2 hours.  You have belly pain that lasts for more than 5 hours.  You have a fever or chills.  You keep feeling sick to your stomach or you keep throwing up.  Your skin or the whites of your eyes turn yellow (jaundice).  You have dark-colored pee (urine).  You have light-colored poop (stool). Summary  Cholelithiasis is also called "gallstones."  The gallbladder is an organ that stores a liquid (bile) that helps you digest fat.  Silent  gallstones are gallstones that do not cause symptoms.  A gallbladder attack Debono cause sudden pain in the upper right side of your belly. Pain might spread to your right shoulder or your chest. If this happens, contact your doctor.  If you have sudden pain in the upper right side of your belly that lasts for more than 2 hours, get help right away. This information is not intended to replace advice given to you by your health care provider. Make sure you discuss any questions you have with your health care provider. Document Revised: 02/18/2017 Document Reviewed: 11/23/2015 Elsevier Patient Education  2020 [place]   1) The drugs that you were given will stay in your system until tomorrow so for the next 24 hours you should not:  A) Drive an automobile B) Make any legal decisions C) Drink any alcoholic beverage   2) You Hagger resume regular meals tomorrow.  Today it is better to start with liquids and gradually work up to solid foods.  You Hungate eat anything you prefer, but it is better to start with liquids, then soup and crackers, and gradually work up to solid foods.   3) Please notify your doctor immediately if you have any unusual bleeding, trouble breathing, redness and pain at the surgery site, drainage, fever, or pain not relieved by medication.    4) Additional Instructions:        Please contact your physician with any problems or Same Day Surgery at [phone], Monday through  Friday 6 am to 4 pm, or [place] at [facility] number at [phone].

## 2019-04-11 NOTE — Interval H&P Note (Signed)
History and Physical Interval Note:  04/11/2019 8:23 AM  Susan Snyder  has presented today for surgery, with the diagnosis of chronic calculous cholecystitis.  The various methods of treatment have been discussed with the patient and family. After consideration of risks, benefits and other options for treatment, the patient has consented to  Procedure(s): LAPAROSCOPIC CHOLECYSTECTOMY (N/A) as a surgical intervention.  The patient's history has been reviewed, patient examined, no change in status, stable for surgery.  I have reviewed the patient's chart and labs.  Questions were answered to the patient's satisfaction.     Ronny Bacon

## 2019-04-11 NOTE — Anesthesia Preprocedure Evaluation (Addendum)
Anesthesia Evaluation  Patient identified by MRN, date of birth, ID band Patient awake    Reviewed: Allergy & Precautions, H&P , NPO status , Patient's Chart, lab work & pertinent test results  History of Anesthesia Complications (+) PONV and history of anesthetic complications  Airway Mallampati: II  TM Distance: >3 FB Neck ROM: limited    Dental  (+) Chipped, Dental Advidsory Given, Missing   Pulmonary neg sleep apnea, neg COPD, Current Smoker,    Pulmonary exam normal        Cardiovascular hypertension, (-) angina(-) Past MI, (-) Cardiac Stents and (-) CABG Normal cardiovascular exam(-) dysrhythmias (-) Valvular Problems/Murmurs     Neuro/Psych  Headaches, neg Seizures PSYCHIATRIC DISORDERS Anxiety H/o cervical spine surgery    GI/Hepatic Neg liver ROS, GERD  ,  Endo/Other  negative endocrine ROS  Renal/GU negative Renal ROS     Musculoskeletal   Abdominal   Peds  Hematology negative hematology ROS (+)   Anesthesia Other Findings Past Medical History: No date: Chronic diarrhea Sept. 2015: Chronic gastritis     Comment:  on EGD  No date: Collagenous colitis     Comment:  colonoscopy biopsies 11/01/13 No date: Hyperplastic polyps of stomach     Comment:  Aug 2015 No date: Hypertension 05/20/2016: Neoplasm of uncertain behavior of skin     Comment:  Hx of skin cancer, BCC No date: Renal cyst  Past Surgical History: 2005: ABDOMINAL HYSTERECTOMY     Comment:  partial, still has ovaries, due to fibroids. [place]: BACK SURGERY     Comment:  x 4 11/01/13: COLONOSCOPY W/ BIOPSIES     Comment:  collagenous colitis, sigmoid polypectomy x 2;               hyperplastic polyps 11/22/13: ESOPHAGOGASTRODUODENOSCOPY     Comment:  chronic gastritis 01/15/14,03/05/14: HIP ARTHROPLASTY; Bilateral     Comment:  total hip, Dr. Nilsa Nutting 2015: NECK SURGERY  BMI    Body Mass Index:  33.45 kg/m       Reproductive/Obstetrics negative OB ROS                            Anesthesia Physical  Anesthesia Plan  ASA: II  Anesthesia Plan: General   Post-op Pain Management:    Induction: Intravenous  PONV Risk Score and Plan: Ondansetron, Dexamethasone, Midazolam and Treatment Cothron vary due to age or medical condition  Airway Management Planned: Oral ETT  Additional Equipment:   Intra-op Plan:   Post-operative Plan: Extubation in OR  Informed Consent: I have reviewed the patients History and Physical, chart, labs and discussed the procedure including the risks, benefits and alternatives for the proposed anesthesia with the patient or authorized representative who has indicated his/her understanding and acceptance.     Dental Advisory Given  Plan Discussed with: Anesthesiologist and CRNA  Anesthesia Plan Comments:       Anesthesia Quick Evaluation

## 2019-04-11 NOTE — Transfer of Care (Signed)
Immediate Anesthesia Transfer of Care Note  Patient: Susan Snyder  Procedure(s) Performed: LAPAROSCOPIC CHOLECYSTECTOMY (N/A ) INDOCYANINE GREEN FLUORESCENCE IMAGING (ICG)  Patient Location: PACU  Anesthesia Type:General  Level of Consciousness: sedated  Airway & Oxygen Therapy: Patient Spontanous Breathing and Patient connected to face mask oxygen  Post-op Assessment: Report given to RN and Post -op Vital signs reviewed and stable  Post vital signs: Reviewed and stable  Last Vitals:  Vitals Value Taken Time  BP 135/75 04/11/19 1001  Temp 36.5 C 04/11/19 1001  Pulse 75 04/11/19 1004  Resp 15 04/11/19 1004  SpO2 95 % 04/11/19 1004  Vitals shown include unvalidated device data.  Last Pain:  Vitals:   04/11/19 0718  TempSrc: Tympanic  PainSc: 1          Complications: No apparent anesthesia complications

## 2019-04-12 LAB — SURGICAL PATHOLOGY

## 2019-04-19 ENCOUNTER — Ambulatory Visit (INDEPENDENT_AMBULATORY_CARE_PROVIDER_SITE_OTHER): Payer: Self-pay | Admitting: Surgery

## 2019-04-19 ENCOUNTER — Other Ambulatory Visit: Payer: Self-pay

## 2019-04-19 ENCOUNTER — Encounter: Payer: Self-pay | Admitting: Surgery

## 2019-04-19 VITALS — BP 139/89 | HR 74 | Temp 97.7°F | Ht 69.0 in | Wt 219.8 lb

## 2019-04-19 DIAGNOSIS — Z9049 Acquired absence of other specified parts of digestive tract: Secondary | ICD-10-CM | POA: Insufficient documentation

## 2019-04-19 NOTE — Patient Instructions (Addendum)
Dr.Rodenberg recommends patient to try over the counter Antiacids such as Pepcid,  Prilosec or Tums and recommended patient to also try over the counter Fiber supplements to help with diarrhea.   Gallbladder Eating Plan If you have a gallbladder condition, you Koloski have trouble digesting fats. Eating a low-fat diet can help reduce your symptoms, and Arista be helpful before and after having surgery to remove your gallbladder (cholecystectomy). Your health care provider Ceasar recommend that you work with a diet and nutrition specialist (dietitian) to help you reduce the amount of fat in your diet. What are tips for following this plan? General guidelines  Limit your fat intake to less than 30% of your total daily calories. If you eat around 1,800 calories each day, this is less than 60 grams (g) of fat per day.  Fat is an important part of a healthy diet. Eating a low-fat diet can make it hard to maintain a healthy body weight. Ask your dietitian how much fat, calories, and other nutrients you need each day.  Eat small, frequent meals throughout the day instead of three large meals.  Drink at least 8-10 cups of fluid a day. Drink enough fluid to keep your urine clear or pale yellow.  Limit alcohol intake to no more than 1 drink a day for nonpregnant women and 2 drinks a day for men. One drink equals 12 oz of beer, 5 oz of wine, or 1 oz of hard liquor. Reading food labels  Check Nutrition Facts on food labels for the amount of fat per serving. Choose foods with less than 3 grams of fat per serving. Shopping  Choose nonfat and low-fat healthy foods. Look for the words "nonfat," "low fat," or "fat free."  Avoid buying processed or prepackaged foods. Cooking  Cook using low-fat methods, such as baking, broiling, grilling, or boiling.  Cook with small amounts of healthy fats, such as olive oil, grapeseed oil, canola oil, or sunflower oil. What foods are recommended?   All fresh, frozen, or  canned fruits and vegetables.  Whole grains.  Low-fat or non-fat (skim) milk and yogurt.  Lean meat, skinless poultry, fish, eggs, and beans.  Low-fat protein supplement powders or drinks.  Spices and herbs. What foods are not recommended?  High-fat foods. These include baked goods, fast food, fatty cuts of meat, ice cream, french toast, sweet rolls, pizza, cheese bread, foods covered with butter, creamy sauces, or cheese.  Fried foods. These include french fries, tempura, battered fish, breaded chicken, fried breads, and sweets.  Foods with strong odors.  Foods that cause bloating and gas. Summary  A low-fat diet can be helpful if you have a gallbladder condition, or before and after gallbladder surgery.  Limit your fat intake to less than 30% of your total daily calories. This is about 60 g of fat if you eat 1,800 calories each day.  Eat small, frequent meals throughout the day instead of three large meals. This information is not intended to replace advice given to you by your health care provider. Make sure you discuss any questions you have with your health care provider. Document Revised: 06/29/2018 Document Reviewed: 04/15/2016 Elsevier Patient Education  [place].

## 2019-04-19 NOTE — Progress Notes (Signed)
[facility] POST-OP OFFICE VISIT  04/19/2019  HPI: Susan Snyder is a 54 y.o. female approximately a week s/p laparoscopic cholecystectomy.  She continues to have a degree of reflux even when sitting in an upright position, she has not tried anything yet.  She reports it is significantly better than she was preop.  She had the expected incisional soreness.  Some loose stools.  Slightly diminished appetite.  Vital signs: BP 139/89   Pulse 74   Temp 97.7 F (36.5 C) (Temporal)   Ht 5' 9"  (1.753 m)   Wt 219 lb 12.8 oz (99.7 kg)   SpO2 97%   BMI 32.46 kg/m    Physical Exam: Constitutional: appears well  Abdomen: soft and NT Skin: incisions c/d/i.    Assessment/Plan: This is a 54 y.o. female 7 days s/p laparoscopic chole.   Patient Active Problem List   Diagnosis Date Noted  . CCC (chronic calculous cholecystitis) 04/03/2019  . Status post laparoscopic appendectomy 04/03/2019  . Preventative health care 05/20/2016  . Neoplasm of uncertain behavior of skin 05/20/2016  . 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 Vitamin B12 deficiency 01/14/2015  . Vitamin D deficiency 01/14/2015  . Obesity 01/14/2015  . Hx of iron deficiency anemia 01/14/2015  . Frequent headaches 12/28/2014  . Abnormal finding on MRI of brain 12/28/2014  . Tobacco abuse 12/23/2014  . Stress reaction 12/23/2014  . Hypertension   . Renal cyst   . Collagenous colitis   . H/O bilateral hip replacements 06/28/2014  . Celiac disease 01/02/2014  . Primary localized osteoarthrosis, pelvic region and thigh 10/25/2013  . Cervical spondylosis 02/06/2013    - reviewed diet, initation of TUMS, and possibly OTC PPI for her reflux symptoms. Fiber supplementation to assist with regularity.  Avoidance of NSAIDs Hebenstreit also help her GERD.  F/U as needed.     Ronny Bacon M.D., FACS 04/19/2019, 10:27 AM

## 2019-04-28 ENCOUNTER — Other Ambulatory Visit: Payer: Self-pay | Admitting: Family Medicine

## 2019-05-08 ENCOUNTER — Telehealth: Payer: Self-pay | Admitting: *Deleted

## 2019-05-08 NOTE — Telephone Encounter (Signed)
Mailed paperwork [facility] to patients address

## 2019-06-25 ENCOUNTER — Telehealth: Payer: Self-pay | Admitting: Family Medicine

## 2019-06-25 NOTE — Telephone Encounter (Signed)
Requested medication (s) are due for refill today: yes  Requested medication (s) are on the active medication list: yes  Last refill:  last ordered by historical provider  Future visit scheduled: no  Notes to clinic:  Last ordered by historical provider. Unable to refill per protocol    Requested Prescriptions  Pending Prescriptions Disp Refills   buPROPion (WELLBUTRIN XL) 300 MG 24 hr tablet       Sig: Take 1 tablet (300 mg total) by mouth daily.      Psychiatry: Antidepressants - bupropion Passed - 06/25/2019  4:58 PM      Passed - Last BP in normal range    BP Readings from Last 1 Encounters:  04/19/19 139/89          Passed - Valid encounter within last 6 months    Recent Outpatient Visits           4 months ago Vitamin D deficiency   [facility] Delsa Grana, PA-C   11 months ago Complicated UTI (urinary tract infection)   [facility], NP   12 months ago UTI symptoms   [facility], NP   1 year ago Preventative health care   [facility] Lada, Satira Anis, MD   1 year ago Chronic diarrhea   [facility] Lada, Satira Anis, MD

## 2019-06-25 NOTE — Telephone Encounter (Signed)
Copied from [place] [id number]. Topic: Quick Communication - Rx Refill/Question >> Jun 25, 2019  4:53 PM Izola Price, [place] A wrote: Medication: buPROPion (WELLBUTRIN XL) 300 MG 24 hr tablet   Has the patient contacted their pharmacy? Yes (Agent: If no, request that the patient contact the pharmacy for the refill.) (Agent: If yes, when and what did the pharmacy advise?)Contact PCP  Preferred Pharmacy (with phone number or street name): [id number] [address] [place] Phone:  [phone]:  [phone]    Agent: Please be advised that RX refills [place] take up to 3 business days. We ask that you follow-up with your pharmacy.

## 2019-07-24 ENCOUNTER — Ambulatory Visit: Payer: 59 | Admitting: Family Medicine

## 2019-07-24 ENCOUNTER — Other Ambulatory Visit: Payer: Self-pay

## 2019-07-24 ENCOUNTER — Encounter: Payer: Self-pay | Admitting: Family Medicine

## 2019-07-24 VITALS — BP 132/82 | HR 99 | Temp 98.3°F | Resp 14 | Ht 69.0 in | Wt 219.3 lb

## 2019-07-24 DIAGNOSIS — R350 Frequency of micturition: Secondary | ICD-10-CM

## 2019-07-24 DIAGNOSIS — K52831 Collagenous colitis: Secondary | ICD-10-CM

## 2019-07-24 DIAGNOSIS — R300 Dysuria: Secondary | ICD-10-CM | POA: Diagnosis not present

## 2019-07-24 DIAGNOSIS — R5383 Other fatigue: Secondary | ICD-10-CM

## 2019-07-24 DIAGNOSIS — Z87440 Personal history of urinary (tract) infections: Secondary | ICD-10-CM | POA: Diagnosis not present

## 2019-07-24 DIAGNOSIS — E559 Vitamin D deficiency, unspecified: Secondary | ICD-10-CM

## 2019-07-24 DIAGNOSIS — R109 Unspecified abdominal pain: Secondary | ICD-10-CM | POA: Diagnosis not present

## 2019-07-24 DIAGNOSIS — N390 Urinary tract infection, site not specified: Secondary | ICD-10-CM

## 2019-07-24 DIAGNOSIS — Z5181 Encounter for therapeutic drug level monitoring: Secondary | ICD-10-CM

## 2019-07-24 DIAGNOSIS — F329 Major depressive disorder, single episode, unspecified: Secondary | ICD-10-CM

## 2019-07-24 DIAGNOSIS — R1011 Right upper quadrant pain: Secondary | ICD-10-CM

## 2019-07-24 DIAGNOSIS — K9049 Malabsorption due to intolerance, not elsewhere classified: Secondary | ICD-10-CM

## 2019-07-24 DIAGNOSIS — R197 Diarrhea, unspecified: Secondary | ICD-10-CM

## 2019-07-24 DIAGNOSIS — R635 Abnormal weight gain: Secondary | ICD-10-CM

## 2019-07-24 DIAGNOSIS — E782 Mixed hyperlipidemia: Secondary | ICD-10-CM

## 2019-07-24 DIAGNOSIS — E538 Deficiency of other specified B group vitamins: Secondary | ICD-10-CM

## 2019-07-24 DIAGNOSIS — I10 Essential (primary) hypertension: Secondary | ICD-10-CM

## 2019-07-24 LAB — LIPID PANEL
Cholesterol: 141 mg/dL (ref <200)
HDL: 31 mg/dL — ABNORMAL LOW (ref >=50)
LDL Cholesterol (Calc): 80 mg/dL (calc)
Non-HDL Cholesterol (Calc): 110 mg/dL (calc) (ref <130)
Total CHOL/HDL Ratio: 4.5 (calc) (ref <5.0)
Triglycerides: 199 mg/dL — ABNORMAL HIGH (ref <150)

## 2019-07-24 LAB — CBC WITH DIFFERENTIAL/PLATELET
Absolute Monocytes: 517 cells/uL (ref 200–950)
Basophils Absolute: 25 cells/uL (ref 0–200)
Basophils Relative: 0.2 %
Eosinophils Absolute: 566 cells/uL — ABNORMAL HIGH (ref 15–500)
Eosinophils Relative: 4.6 %
HCT: 40.7 % (ref 35.0–45.0)
Hemoglobin: 13 g/dL (ref 11.7–15.5)
Lymphs Abs: 4256 cells/uL — ABNORMAL HIGH (ref 850–3900)
MCH: 29 pg (ref 27.0–33.0)
MCHC: 31.9 g/dL — ABNORMAL LOW (ref 32.0–36.0)
MCV: 90.8 fL (ref 80.0–100.0)
MPV: 11.2 fL (ref 7.5–12.5)
Monocytes Relative: 4.2 %
Neutro Abs: 6937 cells/uL (ref 1500–7800)
Neutrophils Relative %: 56.4 %
Platelets: 313 10*3/uL (ref 140–400)
RBC: 4.48 10*6/uL (ref 3.80–5.10)
RDW: 13 % (ref 11.0–15.0)
Total Lymphocyte: 34.6 %
WBC: 12.3 10*3/uL — ABNORMAL HIGH (ref 3.8–10.8)

## 2019-07-24 LAB — POCT URINALYSIS DIPSTICK
Bilirubin, UA: NEGATIVE
Glucose, UA: NEGATIVE
Ketones, UA: NEGATIVE
Nitrite, UA: NEGATIVE
Protein, UA: NEGATIVE
Spec Grav, UA: 1.02 (ref 1.010–1.025)
Urobilinogen, UA: 0.2 E.U./dL
pH, UA: 5.5 (ref 5.0–8.0)

## 2019-07-24 LAB — COMPLETE METABOLIC PANEL WITH GFR
AG Ratio: 1.8 (calc) (ref 1.0–2.5)
ALT: 13 U/L (ref 6–29)
AST: 15 U/L (ref 10–35)
Albumin: 4.1 g/dL (ref 3.6–5.1)
Alkaline phosphatase (APISO): 95 U/L (ref 37–153)
BUN/Creatinine Ratio: 9 (calc) (ref 6–22)
BUN: 11 mg/dL (ref 7–25)
CO2: 25 mmol/L (ref 20–32)
Calcium: 9.4 mg/dL (ref 8.6–10.4)
Chloride: 108 mmol/L (ref 98–110)
Creat: 1.21 mg/dL — ABNORMAL HIGH (ref 0.50–1.05)
GFR, Est African American: 58 mL/min/{1.73_m2} — ABNORMAL LOW (ref >=60)
GFR, Est Non African American: 50 mL/min/{1.73_m2} — ABNORMAL LOW (ref >=60)
Globulin: 2.3 g/dL (calc) (ref 1.9–3.7)
Glucose, Bld: 88 mg/dL (ref 65–99)
Potassium: 4.9 mmol/L (ref 3.5–5.3)
Sodium: 140 mmol/L (ref 135–146)
Total Bilirubin: 0.3 mg/dL (ref 0.2–1.2)
Total Protein: 6.4 g/dL (ref 6.1–8.1)

## 2019-07-24 LAB — VITAMIN D 25 HYDROXY (VIT D DEFICIENCY, FRACTURES): Vit D, 25-Hydroxy: 26 ng/mL — ABNORMAL LOW (ref 30–100)

## 2019-07-24 LAB — VITAMIN B12: Vitamin B-12: 295 pg/mL (ref 200–1100)

## 2019-07-24 LAB — TSH: TSH: 1 mIU/L

## 2019-07-24 MED ORDER — NITROFURANTOIN MONOHYD MACRO 100 MG PO CAPS: 100.0000 mg | ORAL_CAPSULE | Freq: Two times a day (BID) | ORAL | 0 refills | Status: DC

## 2019-07-24 NOTE — Progress Notes (Signed)
[name], female    DOB: Jun 19, 1965, 54 y.o.   MRN: [id number]  PCP: Delsa Grana, PA-C  Chief Complaint  Patient presents with  . Urinary Tract Infection    off and on for month, symptoms include: frequency and burning  . Flank Pain    right side where she had gallbladder removed    Subjective:   Susan Snyder is a 54 y.o. female, presents to clinic with CC of the following:  HPI  She is having urinary sx for months, recently worsened, urinary sx seemed to start after  Surgery in January Urinary sx frequency, urgency, waking her every night with pressure . She is also having right upper abdominal right flank pain that has been there since surgery as well but recently much more severe  Results for orders placed or performed in visit on 07/24/19  POCT urinalysis dipstick  Result Value Ref Range   Color, UA yellow    Clarity, UA clear    Glucose, UA Negative Negative   Bilirubin, UA neg    Ketones, UA neg    Spec Grav, UA 1.020 1.010 - 1.025   Blood, UA trace    pH, UA 5.5 5.0 - 8.0   Protein, UA Negative Negative   Urobilinogen, UA 0.2 0.2 or 1.0 E.U./dL   Nitrite, UA neg    Leukocytes, UA Moderate (2+) (A) Negative   Appearance clear    Odor nromal      She denies any change in bowel movements -no change from her normal with hx of collagenous colitis - her GI f/up was pushed back due to pandemic - Dr. Allen Norris.  Recent cholecystectomy - Jan this year, ever since surgery she has a lot of symptoms - esp urinary She is not sure if they did foley catheter - it was same day surgery  Urine done here showed leukocytes and trace blood.  Pt has not noticed odor, change in color or hematuria  She also feels she is bloated and endorses gaining weight although not eating very much, very fatigued as well.  She does have history of multiple deficiencies secondary to colitis, she has not been very good at taking some of her supplements.  History of B12, vitamin D  deficiency and iron deficiency anemia She is also starting to feel depressed Depression screen [facility] 2/9 07/24/2019 01/30/2019 06/26/2018  Decreased Interest 0 0 0  Down, Depressed, Hopeless 2 0 0  PHQ - 2 Score 2 0 0  Altered sleeping 2 0 0  Tired, decreased energy 2 0 0  Change in appetite 0 0 0  Feeling bad or failure about yourself  0 0 0  Trouble concentrating 0 0 0  Moving slowly or fidgety/restless 0 0 0  Suicidal thoughts 0 0 0  PHQ-9 Score 6 0 0  Difficult doing work/chores Somewhat difficult Not difficult at all Not difficult at all   She is on Wellbutrin 150 mg twice daily       Patient Active Problem List   Diagnosis Date Noted  . Status post laparoscopic cholecystectomy 04/19/2019  . CCC (chronic calculous cholecystitis) 04/03/2019  . Status post laparoscopic appendectomy 04/03/2019  . Preventative health care 05/20/2016  . Neoplasm of uncertain behavior of skin 05/20/2016  . 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 Vitamin B12 deficiency 01/14/2015  . Vitamin D deficiency 01/14/2015  . Obesity 01/14/2015  . Hx of iron deficiency anemia 01/14/2015  . Frequent headaches 12/28/2014  . Abnormal  finding on MRI of brain 12/28/2014  . Tobacco abuse 12/23/2014  . Stress reaction 12/23/2014  . Hypertension   . Renal cyst   . Collagenous colitis   . H/O bilateral hip replacements 06/28/2014  . Celiac disease 01/02/2014  . Primary localized osteoarthrosis, pelvic region and thigh 10/25/2013  . Cervical spondylosis 02/06/2013      Current Outpatient Medications:  .  aspirin EC 81 MG tablet, Take 1 tablet (81 mg total) by mouth daily., Disp: , Rfl:  .  atorvastatin (LIPITOR) 10 MG tablet, TAKE 1 TABLET BY MOUTH EVERYDAY AT BEDTIME (Patient taking differently: Take 10 mg by mouth at bedtime. TAKE 1 TABLET BY MOUTH EVERYDAY AT BEDTIME), Disp: 90 tablet, Rfl: 1 .  buPROPion (WELLBUTRIN XL) 300 MG 24 hr tablet, Take 150 mg by mouth 2 (two) times daily. , Disp: , Rfl:  .   loperamide (IMODIUM) 2 MG capsule, Take 2 mg by mouth as needed for diarrhea or loose stools. , Disp: , Rfl:  .  metoprolol succinate (TOPROL-XL) 25 MG 24 hr tablet, TAKE 1 TABLET BY MOUTH EVERY DAY**NEEDS OFFICE VISIT** (Patient taking differently: Take 25 mg by mouth daily. ), Disp: 90 tablet, Rfl: 1 .  Multiple Vitamin (MULTIVITAMIN WITH MINERALS) TABS tablet, Take 1 tablet by mouth daily., Disp: , Rfl:  .  ondansetron (ZOFRAN-ODT) 4 MG disintegrating tablet, Take 4 mg by mouth every 8 (eight) hours as needed for nausea/vomiting., Disp: , Rfl:  .  Vitamin D, Cholecalciferol, 25 MCG (1000 UT) TABS, Take 1,000 Units by mouth daily. , Disp: , Rfl:    Allergies  Allergen Reactions  . Keflex [Cephalexin] Shortness Of Breath and Palpitations  . Sulfa Antibiotics Shortness Of Breath and Palpitations  . Adhesive [Tape]     Blisters  . Gabapentin Nausea And Vomiting  . Penicillins Rash    Did it involve swelling of the face/tongue/throat, SOB, or low BP? No Did it involve sudden or severe rash/hives, skin peeling, or any reaction on the inside of your mouth or nose? No Did you need to seek medical attention at a hospital or doctor's office? No When did it last happen?Childhood If all above answers are "NO", [place] proceed with cephalosporin use.      Family History  Problem Relation Age of Onset  . Stroke Mother   . Glaucoma Mother   . Heart disease Father   . Cancer Father        prostate  . Heart disease Sister   . Diabetes Sister   . Diabetes Paternal Aunt   . Heart disease Paternal Aunt   . Bleeding Disorder Paternal Uncle   . Heart disease Sister        Psychologist, forensic  . Diabetes Paternal Aunt   . Heart disease Paternal Aunt      Social History   Socioeconomic History  . Marital status: Widowed    Spouse name: Not on file  . Number of children: 2  . Years of education: 28  . Highest education level: Some college, no degree  Occupational History  . Occupation:  Barista  Tobacco Use  . Smoking status: Current Every Day Smoker    Packs/day: 0.50    Types: Cigarettes  . Smokeless tobacco: Never Used  Substance and Sexual Activity  . Alcohol use: No  . Drug use: No  . Sexual activity: Not Currently  Other Topics Concern  . Not on file  Social History Narrative  . Not on file  Social Determinants of Health   Financial Resource Strain:   . Difficulty of Paying Living Expenses:   Food Insecurity:   . Worried About Charity fundraiser in the Last Year:   . Arboriculturist in the Last Year:   Transportation Needs:   . Film/video editor (Medical):   Marland Kitchen Lack of Transportation (Non-Medical):   Physical Activity:   . Days of Exercise per Week:   . Minutes of Exercise per Session:   Stress:   . Feeling of Stress :   Social Connections:   . Frequency of Communication with Friends and Family:   . Frequency of Social Gatherings with Friends and Family:   . Attends Religious Services:   . Active Member of Clubs or Organizations:   . Attends Archivist Meetings:   Marland Kitchen Marital Status:   Intimate Partner Violence:   . Fear of Current or Ex-Partner:   . Emotionally Abused:   Marland Kitchen Physically Abused:   . Sexually Abused:     Chart Review Today: I personally reviewed active problem list, medication list, allergies, family history, social history, health maintenance, notes from last encounter, lab results, imaging with the patient/caregiver today.   Review of Systems 10 Systems reviewed and are negative for acute change except as noted in the HPI.     Objective:   Vitals:   07/24/19 1350  BP: 132/82  Pulse: 99  Resp: 14  Temp: 98.3 F (36.8 C)  SpO2: 97%  Weight: 219 lb 4.8 oz (99.5 kg)  Height: 5' 9"  (1.753 m)    Body mass index is 32.38 kg/m.  Physical Exam Vitals and nursing note reviewed.  Constitutional:      General: She is not in acute distress.    Appearance: Normal appearance. She is well-developed. She  is obese. She is not ill-appearing, toxic-appearing or diaphoretic.     Interventions: Face mask in place.  HENT:     Head: Normocephalic and atraumatic.     Right Ear: External ear normal.     Left Ear: External ear normal.  Eyes:     General: Lids are normal. No scleral icterus.       Right eye: No discharge.        Left eye: No discharge.     Conjunctiva/sclera: Conjunctivae normal.  Neck:     Trachea: Phonation normal. No tracheal deviation.  Cardiovascular:     Rate and Rhythm: Normal rate and regular rhythm.     Pulses: Normal pulses.          Radial pulses are 2+ on the right side and 2+ on the left side.       Posterior tibial pulses are 2+ on the right side and 2+ on the left side.     Heart sounds: Normal heart sounds. No murmur. No friction rub. No gallop.   Pulmonary:     Effort: Pulmonary effort is normal. No respiratory distress.     Breath sounds: Normal breath sounds. No stridor. No wheezing, rhonchi or rales.  Chest:     Chest wall: No tenderness.  Abdominal:     General: Bowel sounds are normal. There is no distension.     Palpations: Abdomen is soft.     Tenderness: There is abdominal tenderness. There is no right CVA tenderness, left CVA tenderness, guarding or rebound.  Musculoskeletal:        General: No deformity. Normal range of motion.     Cervical back:  Normal range of motion and neck supple.     Right lower leg: No edema.     Left lower leg: No edema.  Lymphadenopathy:     Cervical: No cervical adenopathy.  Skin:    General: Skin is warm and dry.     Capillary Refill: Capillary refill takes less than 2 seconds.     Coloration: Skin is not jaundiced or pale.     Findings: No rash.  Neurological:     Mental Status: She is alert and oriented to person, place, and time.     Motor: No abnormal muscle tone.     Gait: Gait normal.  Psychiatric:        Mood and Affect: Mood normal.        Speech: Speech normal.        Behavior: Behavior normal.        Results for orders placed or performed during the hospital encounter of 04/11/19  Surgical pathology  Result Value Ref Range   SURGICAL PATHOLOGY      SURGICAL PATHOLOGY CASE: [id number] PATIENT: Laquashia Raimondo Surgical Pathology Report     Specimen Submitted: A. Gallbladder  Clinical History: Chronic calculous cholecystitis.      DIAGNOSIS: A.  GALLBLADDER; CHOLECYSTECTOMY: - CHRONIC CHOLECYSTITIS WITH CHOLELITHIASIS. - NEGATIVE FOR MALIGNANCY.  GROSS DESCRIPTION: A. Labeled: Gallbladder. Received: In formalin Size of specimen: 9.0 x 2.0 x 1.5 cm Specimen integrity: Intact External surface: Green smooth and glistering Wall thickness: 0.3 cm Mucosa: Green and trabeculated Cystic duct: Open, 0.2 cm in diameter Bile present: Green and viscous Stones present: 2 green firm stones are measuring 1.0 cm and 2.0 cm in greatest dimension Other findings: Not identified  Block summary: 1 -representative sections, including cystic duct margin   Final Diagnosis performed by Quay Burow, MD.   Electronically signed 04/12/2019 9:01:23AM The electronic signature indicates that the named Attending Pathologist  has evaluated the specimen Technical component performed at [facility], [address] [place], [place] [address] Lab: [phone] Dir: Rush Farmer, MD, MMM  Professional component performed at [facility], [facility], [place], [place], [place] [address] Lab: [phone] Dir: Dellia Nims. Reuel Derby, MD         Assessment & Plan:      ICD-10-CM   1. Urine frequency  R35.0 POCT urinalysis dipstick    Urine Culture    nitrofurantoin, macrocrystal-monohydrate, (MACROBID) 100 MG capsule   dip with +leuks and trace blood, with hx today and hx of recurrent UTI will start abx tx, she has alot of allergies to abx, macrobid will not cover pyelo  2. Burning with urination  R30.0 POCT urinalysis dipstick    nitrofurantoin, macrocrystal-monohydrate,  (MACROBID) 100 MG capsule  3. History of recurrent UTIs  Z87.440 Urine Culture   urine culture added  4. Flank pain  R10.9 Urine Culture   No concerning CVA tenderness on exam?  MSK? secondary to post op changes or other GI dx?  urine culture pending  5. Right upper quadrant abdominal pain  R10.11 CBC with Differential/Platelet    COMPLETE METABOLIC PANEL WITH GFR   White upper quadrant abdominal pain on exam without peritoneal signs patient well-appearing does not appear toxic, possibly postop changes  6. Vitamin D deficiency  E55.9 VITAMIN D 25 Hydroxy (Vit-D Deficiency, Fractures)   History of deficiency with malabsorption recheck not consistently supplementing  7. Vitamin B12 deficiency  E53.8 CBC with Differential/Platelet    Vitamin B12   History of deficiency with malabsorption recheck  not consistently supplementing  8. Essential hypertension  J57 COMPLETE METABOLIC PANEL WITH GFR   labs done today - pt will do virtual f/up on chronic conditions with next appt  9. Mixed hyperlipidemia  S17.7 COMPLETE METABOLIC PANEL WITH GFR    Lipid panel   labs done today - pt will do virtual f/up on chronic conditions with next appt  10. Collagenous colitis  K52.831 CBC with Differential/Platelet    COMPLETE METABOLIC PANEL WITH GFR    VITAMIN D 25 Hydroxy (Vit-D Deficiency, Fractures)    Vitamin B12   Encourage her to follow-up with Dr. Allen Norris  11. Major depressive disorder with current active episode, unspecified depression episode severity, unspecified whether recurrent  F32.9    on Wellbutrin twice a day discussed adding SSRI or SNRI vs monitoring until physical sx and fatigue are worked up and improve  12. Malabsorption due to intolerance, not elsewhere classified  K90.49 CBC with Differential/Platelet    COMPLETE METABOLIC PANEL WITH GFR    VITAMIN D 25 Hydroxy (Vit-D Deficiency, Fractures)    Vitamin B12   chronic diarrhea with dx of  collagenous colitis - not currently on tx, no sig  change with BMs after surgery, hx of deficiencies, recheck B12, vit D, anemia  13. Diarrhea, unspecified type  R19.7 CBC with Differential/Platelet    COMPLETE METABOLIC PANEL WITH GFR    TSH    VITAMIN D 25 Hydroxy (Vit-D Deficiency, Fractures)    Vitamin B12   not currently tx for collagenous colitis, past recommended treatment was too expensive to take budesonide PO -encourage patient to follow-up with GI  14. Other fatigue  R53.83 CBC with Differential/Platelet    COMPLETE METABOLIC PANEL WITH GFR    TSH    VITAMIN D 25 Hydroxy (Vit-D Deficiency, Fractures)    Vitamin B12   Fitterer be due to malabsorption?  Recent surgery and increased stress with work Reeb be secondary to stress and depression rule out anemia B12 deficiency hypothyroid  15. Unexplained weight gain  R63.5 Lipid panel    TSH    VITAMIN D 25 Hydroxy (Vit-D Deficiency, Fractures)    Vitamin B12   Check TSH  16. Encounter for medication monitoring  Z51.81 CBC with Differential/Platelet    COMPLETE METABOLIC PANEL WITH GFR    Lipid panel       Delsa Grana, PA-C 07/24/19 2:01 PM

## 2019-07-25 LAB — URINE CULTURE
MICRO NUMBER:: 10439909
SPECIMEN QUALITY:: ADEQUATE

## 2019-07-27 ENCOUNTER — Other Ambulatory Visit: Payer: Self-pay

## 2019-07-27 DIAGNOSIS — R300 Dysuria: Secondary | ICD-10-CM

## 2019-07-27 DIAGNOSIS — R350 Frequency of micturition: Secondary | ICD-10-CM

## 2019-07-27 DIAGNOSIS — Z87440 Personal history of urinary (tract) infections: Secondary | ICD-10-CM

## 2019-07-28 LAB — URINE CULTURE
MICRO NUMBER:: 10453553
SPECIMEN QUALITY:: ADEQUATE

## 2019-07-31 ENCOUNTER — Encounter: Payer: Self-pay | Admitting: Family Medicine

## 2019-08-01 DIAGNOSIS — F329 Major depressive disorder, single episode, unspecified: Secondary | ICD-10-CM | POA: Insufficient documentation

## 2019-08-01 DIAGNOSIS — Z87440 Personal history of urinary (tract) infections: Secondary | ICD-10-CM | POA: Insufficient documentation

## 2019-08-01 HISTORY — DX: Personal history of urinary (tract) infections: Z87.440

## 2019-08-26 ENCOUNTER — Other Ambulatory Visit: Payer: Self-pay | Admitting: Family Medicine

## 2019-08-28 ENCOUNTER — Ambulatory Visit: Payer: 59 | Admitting: Family Medicine

## 2019-08-29 ENCOUNTER — Ambulatory Visit: Payer: 59 | Admitting: Family Medicine

## 2019-08-29 ENCOUNTER — Encounter: Payer: Self-pay | Admitting: Family Medicine

## 2019-08-29 ENCOUNTER — Other Ambulatory Visit (HOSPITAL_COMMUNITY)
Admission: RE | Admit: 2019-08-29 | Discharge: 2019-08-29 | Disposition: A | Discharge status: Home / Self Care | Payer: 59 | Source: Ambulatory Visit | Attending: Family Medicine | Admitting: Family Medicine

## 2019-08-29 ENCOUNTER — Other Ambulatory Visit: Payer: Self-pay

## 2019-08-29 VITALS — BP 126/82 | HR 96 | Temp 97.0°F | Resp 14 | Ht 69.0 in | Wt 216.9 lb

## 2019-08-29 DIAGNOSIS — R309 Painful micturition, unspecified: Secondary | ICD-10-CM

## 2019-08-29 DIAGNOSIS — E559 Vitamin D deficiency, unspecified: Secondary | ICD-10-CM

## 2019-08-29 DIAGNOSIS — Z1159 Encounter for screening for other viral diseases: Secondary | ICD-10-CM

## 2019-08-29 DIAGNOSIS — Z87440 Personal history of urinary (tract) infections: Secondary | ICD-10-CM

## 2019-08-29 DIAGNOSIS — I10 Essential (primary) hypertension: Secondary | ICD-10-CM

## 2019-08-29 DIAGNOSIS — R350 Frequency of micturition: Secondary | ICD-10-CM | POA: Insufficient documentation

## 2019-08-29 DIAGNOSIS — Z1231 Encounter for screening mammogram for malignant neoplasm of breast: Secondary | ICD-10-CM

## 2019-08-29 DIAGNOSIS — Z1211 Encounter for screening for malignant neoplasm of colon: Secondary | ICD-10-CM

## 2019-08-29 DIAGNOSIS — E538 Deficiency of other specified B group vitamins: Secondary | ICD-10-CM

## 2019-08-29 DIAGNOSIS — K52831 Collagenous colitis: Secondary | ICD-10-CM

## 2019-08-29 DIAGNOSIS — N179 Acute kidney failure, unspecified: Secondary | ICD-10-CM

## 2019-08-29 DIAGNOSIS — D72829 Elevated white blood cell count, unspecified: Secondary | ICD-10-CM

## 2019-08-29 DIAGNOSIS — R351 Nocturia: Secondary | ICD-10-CM | POA: Diagnosis not present

## 2019-08-29 LAB — POCT URINALYSIS DIPSTICK
Bilirubin, UA: NEGATIVE
Glucose, UA: NEGATIVE
Ketones, UA: NEGATIVE
Nitrite, UA: NEGATIVE
Protein, UA: NEGATIVE
Spec Grav, UA: 1.02 (ref 1.010–1.025)
Urobilinogen, UA: 0.2 E.U./dL
pH, UA: 6 (ref 5.0–8.0)

## 2019-08-29 MED ORDER — MIRABEGRON ER 25 MG PO TB24: 25.0000 mg | ORAL_TABLET | Freq: Every day | ORAL | 5 refills | Status: DC

## 2019-08-29 MED ORDER — BUDESONIDE 3 MG PO CPEP: 9.0000 mg | ORAL_CAPSULE | Freq: Every day | ORAL | 3 refills | Status: DC

## 2019-08-29 MED ORDER — METOPROLOL SUCCINATE ER 25 MG PO TB24: 25.0000 mg | ORAL_TABLET | Freq: Every day | ORAL | 3 refills | Status: DC

## 2019-08-29 NOTE — Progress Notes (Signed)
Name: Susan Snyder   MRN: [id number]    DOB: Jun 25, 1965   Date:08/29/2019       Progress Note  Chief Complaint  Patient presents with  . Follow-up  . Hypertension  . Hyperlipidemia  . Urinary Tract Infection    Still having painful urination     Subjective:   Susan Snyder is a 54 y.o. female, presents to clinic for routine follow up on the conditions listed above.  Recurrent urinary sx, urine testing 2x last month had neg urine culture x2 showed mixed flora possible contamination in selected recollect.  She endorsed doing a clean-catch with her last 3 collection.  Today discussing her urine sample she states that she has urinary frequency, dysuria and nocturia.  When she does go to urinate she dribbles and has small amounts of urine and it is very difficult to get a midstream catch.  She endorses fairly constant and gradually worsening suprapubic/bladder pressure associated with her urinary frequency.  The dysuria fluctuates.  After the last antibiotic it did get little bit better and when it returned she tried Azo and this was also effective.  She has not been to urology or GYN in the past she has not done any pelvic floor therapy or tried any overactive bladder medications.  She reports that she has not been sexually active in several years.  Doubts any past STDs with monogamous relationship, low risk and she denies vaginal symptoms, is itching burning or vaginal discharge.  Symptoms did begin and worsened after surgeries earlier this year.  Vaginal swab and pelvic for further eval discussed with pt today -agreeable to allowing me to do a quick inspection and vaginal swab  Labs obtained at her last office visit and this visit was for routine follow-up Hypertension:  Currently managed on metoprolol -she takes at bedtime with her cholesterol medications has been on for a long time and does not any side effects or concerns Pt reports that med compliance and denies any SE.  No lightheadedness,  hypotension, syncope. Blood pressure today is well controlled BP Readings from Last 3 Encounters:  08/29/19 126/82  07/24/19 132/82  04/19/19 139/89  Pt denies CP, SOB, exertional sx, LE edema, palpitation, Ha's, visual disturbances  Hyperlipidemia: Current Medication Regimen: Lipitor 10 mg compliant with medications takes daily at bedtime she denies any myalgias or concerns Last Lipids: Lab Results  Component Value Date   CHOL 141 07/24/2019   HDL 31 (L) 07/24/2019   LDLCALC 80 07/24/2019   TRIG 199 (H) 07/24/2019   CHOLHDL 4.5 07/24/2019  At her last labs LDL and total cholesterol at goal history of low HDL - Current Diet: Reports poor diet overall - Denies: Chest pain, shortness of breath, myalgias.  The 10-year ASCVD risk score Mikey Bussing DC Brooke Bonito., et al., 2013) is: 7.5%   Values used to calculate the score:     Age: 37 years     Sex: Female     Is Non-Hispanic African American: No     Diabetic: No     Tobacco smoker: Yes     Systolic Blood Pressure: 353 mmHg     Is BP treated: Yes     HDL Cholesterol: 31 mg/dL     Total Cholesterol: 141 mg/dL   Obesity -she has been concerned with increasing weights, when reviewing weights over the past 6 months she has been gradually decreasing Wt Readings from Last 5 Encounters:  08/29/19 216 lb 14.4 oz (98.4 kg)  07/24/19 219  lb 4.8 oz (99.5 kg)  04/19/19 219 lb 12.8 oz (99.7 kg)  04/11/19 222 lb 10.6 oz (101 kg)  04/03/19 223 lb 3.2 oz (101.2 kg)   BMI Readings from Last 5 Encounters:  08/29/19 32.03 kg/m  07/24/19 32.38 kg/m  04/19/19 32.46 kg/m  04/11/19 33.86 kg/m  04/03/19 33.94 kg/m     She reports continued significant stress and high workload working about 60 hours a week this is going to continue to be significant source of stress for her with her boss restarting chemotherapy she does have to be available all the time for work with long hours and a lot of responsibility. Depression screen [facility] 2/9 08/29/2019 07/24/2019  01/30/2019  Decreased Interest 0 0 0  Down, Depressed, Hopeless 0 2 0  PHQ - 2 Score 0 2 0  Altered sleeping 0 2 0  Tired, decreased energy 0 2 0  Change in appetite 0 0 0  Feeling bad or failure about yourself  0 0 0  Trouble concentrating 0 0 0  Moving slowly or fidgety/restless 0 0 0  Suicidal thoughts 0 0 0  PHQ-9 Score 0 6 0  Difficult doing work/chores Not difficult at all Somewhat difficult Not difficult at all   GAD 7 : Generalized Anxiety Score 06/26/2018  Nervous, Anxious, on Edge 3  Control/stop worrying 3  Worry too much - different things 0  Trouble relaxing 0  Restless 3  Easily annoyed or irritable 0  Afraid - awful might happen 1  Total GAD 7 Score 10  Anxiety Difficulty Not difficult at all   no trouble relaxing, a little irritability, she endorses being able to relax and fall asleep easily but she does endorse being easily irritated with her coworkers her relationships and ability to function in relationships at home with other friends of the numbers are good   He has increased smoking was previously doing half pack per day now doing 1 pack/day Current smoker - 1ppd wellbutrin 150 mg BID   Discussed previously-chronic diarrhea with past medical history of collagenous colitis, has not seen GI in several years, is overdue for colonoscopy.  She was previously given budesonide p.o. to treat collagenous colitis but it was too expensive at that time, he does hope to get follow-up in about the next 3 months with GI to address both her colon cancer screening and her colitis Currently she is using Imodium about every other day.  On the days that she does not take Imodium she states that anything she eats just "runs right through her"  Reviewed her labs from last month she did have low vitamin D although it did improve with over-the-counter vitamin D supplement 1000 IU daily She had low vitamin B12, is not on a supplement has not had injections before      Patient  Active Problem List   Diagnosis Date Noted  . History of recurrent UTIs 08/01/2019  . Major depressive disorder with current active episode 08/01/2019  . Status post laparoscopic cholecystectomy 04/19/2019  . Status post laparoscopic appendectomy 04/03/2019  . Neoplasm of uncertain behavior of skin 05/20/2016  . 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 Vitamin B12 deficiency 01/14/2015  . Vitamin D deficiency 01/14/2015  . Obesity 01/14/2015  . Hx of iron deficiency anemia 01/14/2015  . Frequent headaches 12/28/2014  . Abnormal finding on MRI of brain 12/28/2014  . Tobacco abuse 12/23/2014  . Stress reaction 12/23/2014  . Hypertension   . Renal cyst   . Collagenous colitis   .  H/O bilateral hip replacements 06/28/2014  . Malabsorption due to intolerance, not elsewhere classified 01/02/2014  . Primary localized osteoarthrosis, pelvic region and thigh 10/25/2013  . Lumbar disc herniation with radiculopathy 05/22/2013  . Cervical spondylosis 02/06/2013  . Intervertebral cervical disc disorder with myelopathy, cervical region 02/06/2013    Past Surgical History:  Procedure Laterality Date  . ABDOMINAL HYSTERECTOMY  2005   partial, still has ovaries, due to fibroids.  . APPENDECTOMY    . [place]   x 4  . CHOLECYSTECTOMY N/A 04/11/2019   Procedure: LAPAROSCOPIC CHOLECYSTECTOMY;  Surgeon: Ronny Bacon, MD;  Location: [facility] ORS;  Service: General;  Laterality: N/A;  . COLONOSCOPY W/ BIOPSIES  11/01/13   collagenous colitis, sigmoid polypectomy x 2; hyperplastic polyps  . ESOPHAGOGASTRODUODENOSCOPY  11/22/13   chronic gastritis  . HIP ARTHROPLASTY Bilateral 01/15/14,03/05/14   total hip, Dr. Nilsa Nutting  . LAPAROSCOPIC APPENDECTOMY N/A 08/20/2018   Procedure: APPENDECTOMY LAPAROSCOPIC;  Surgeon: Fredirick Maudlin, MD;  Location: [facility] ORS;  Service: General;  Laterality: N/A;  . NECK SURGERY  2015    Family History  Problem Relation Age of Onset  . Stroke Mother   .  Glaucoma Mother   . Heart disease Father   . Cancer Father        prostate  . Heart disease Sister   . Diabetes Sister   . Diabetes Paternal Aunt   . Heart disease Paternal Aunt   . Bleeding Disorder Paternal Uncle   . Heart disease Sister        Psychologist, forensic  . Diabetes Paternal Aunt   . Heart disease Paternal Aunt     Social History   Tobacco Use  . Smoking status: Current Every Day Smoker    Packs/day: 0.50    Types: Cigarettes  . Smokeless tobacco: Never Used  Substance Use Topics  . Alcohol use: No  . Drug use: No      Current Outpatient Medications:  .  aspirin EC 81 MG tablet, Take 1 tablet (81 mg total) by mouth daily., Disp: , Rfl:  .  atorvastatin (LIPITOR) 10 MG tablet, TAKE 1 TABLET BY MOUTH EVERYDAY AT BEDTIME, Disp: 90 tablet, Rfl: 3 .  buPROPion (WELLBUTRIN XL) 300 MG 24 hr tablet, Take 150 mg by mouth 2 (two) times daily. , Disp: , Rfl:  .  loperamide (IMODIUM) 2 MG capsule, Take 2 mg by mouth as needed for diarrhea or loose stools. , Disp: , Rfl:  .  metoprolol succinate (TOPROL-XL) 25 MG 24 hr tablet, TAKE 1 TABLET BY MOUTH EVERY DAY**NEEDS OFFICE VISIT** (Patient taking differently: Take 25 mg by mouth daily. ), Disp: 90 tablet, Rfl: 1 .  Multiple Vitamin (MULTIVITAMIN WITH MINERALS) TABS tablet, Take 1 tablet by mouth daily., Disp: , Rfl:  .  ondansetron (ZOFRAN-ODT) 4 MG disintegrating tablet, Take 4 mg by mouth every 8 (eight) hours as needed for nausea/vomiting., Disp: , Rfl:  .  Vitamin D, Cholecalciferol, 25 MCG (1000 UT) TABS, Take 1,000 Units by mouth daily. , Disp: , Rfl:   Allergies  Allergen Reactions  . Keflex [Cephalexin] Shortness Of Breath and Palpitations  . Sulfa Antibiotics Shortness Of Breath and Palpitations  . Adhesive [Tape]     Blisters  . Gabapentin Nausea And Vomiting  . Penicillins Rash    Did it involve swelling of the face/tongue/throat, SOB, or low BP? No Did it involve sudden or severe rash/hives, skin peeling, or any  reaction on the  inside of your mouth or nose? No Did you need to seek medical attention at a hospital or doctor's office? No When did it last happen?Childhood If all above answers are "NO", [place] proceed with cephalosporin use.     Chart Review Today: I personally reviewed active problem list, medication list, allergies, family history, social history, health maintenance, notes from last encounter, lab results, imaging with the patient/caregiver today.   Review of Systems  10 Systems reviewed and are negative for acute change except as noted in the HPI.  Objective:    Vitals:   08/29/19 0949  BP: 126/82  Pulse: 96  Resp: 14  Temp: (!) 97 F (36.1 C)  SpO2: 97%  Weight: 216 lb 14.4 oz (98.4 kg)  Height: 5' 9"  (1.753 m)    Body mass index is 32.03 kg/m.  Physical Exam Vitals and nursing note reviewed.  Constitutional:      General: She is not in acute distress.    Appearance: Normal appearance. She is well-developed. She is obese. She is not ill-appearing, toxic-appearing or diaphoretic.  HENT:     Head: Normocephalic and atraumatic.  Eyes:     General:        Right eye: No discharge.        Left eye: No discharge.     Conjunctiva/sclera: Conjunctivae normal.  Neck:     Trachea: No tracheal deviation.  Cardiovascular:     Rate and Rhythm: Normal rate and regular rhythm.     Pulses: Normal pulses.     Heart sounds: Normal heart sounds. No murmur. No friction rub. No gallop.   Pulmonary:     Effort: Pulmonary effort is normal. No respiratory distress.     Breath sounds: Normal breath sounds. No stridor.  Abdominal:     General: Bowel sounds are normal. There is no distension.     Palpations: Abdomen is soft.     Tenderness: There is no abdominal tenderness.  Genitourinary:    Comments: Obtained vaginal swab and inspected vulvovaginal area without full speculum or pelvic exam Normal labia No urethral prolapse or abnormality noted Very weak vaginal walls,  visible collapsing noted with inspection aptima vaginal swab obtained No discharge, rash, lesions, tenderness Skin:    General: Skin is warm and dry.     Findings: No rash.  Neurological:     Mental Status: She is alert. Mental status is at baseline.     Motor: No abnormal muscle tone.     Coordination: Coordination normal.  Psychiatric:        Mood and Affect: Mood normal.        Behavior: Behavior normal.      PHQ2/9: Depression screen [facility] 2/9 08/29/2019 07/24/2019 01/30/2019 06/26/2018 04/13/2018  Decreased Interest 0 0 0 0 0  Down, Depressed, Hopeless 0 2 0 0 0  PHQ - 2 Score 0 2 0 0 0  Altered sleeping 0 2 0 0 0  Tired, decreased energy 0 2 0 0 0  Change in appetite 0 0 0 0 0  Feeling bad or failure about yourself  0 0 0 0 0  Trouble concentrating 0 0 0 0 0  Moving slowly or fidgety/restless 0 0 0 0 0  Suicidal thoughts 0 0 0 0 0  PHQ-9 Score 0 6 0 0 0  Difficult doing work/chores Not difficult at all Somewhat difficult Not difficult at all Not difficult at all Not difficult at all    phq 9 is neg, reviewed  today  Fall Risk: Fall Risk  08/29/2019 07/24/2019 04/19/2019 04/03/2019 01/30/2019  Falls in the past year? 0 0 0 0 0  Number falls in past yr: 0 0 0 - 0  Injury with Fall? 0 0 0 - 0    Functional Status Survey: Is the patient deaf or have difficulty hearing?: No Does the patient have difficulty seeing, even when wearing glasses/contacts?: No Does the patient have difficulty concentrating, remembering, or making decisions?: No Does the patient have difficulty walking or climbing stairs?: No Does the patient have difficulty dressing or bathing?: No Does the patient have difficulty doing errands alone such as visiting a doctor's office or shopping?: No   Assessment & Plan:     ICD-10-CM   1. Painful urination  R30.9 POCT urinalysis dipstick    Urine Culture    Cervicovaginal ancillary only    CANCELED: Urine Culture   Recurrent dysuria with associated urinary  frequency and nocturia, mixed flora in urine cultures recollect today  2. Urinary frequency  R35.0 mirabegron ER (MYRBETRIQ) 25 MG TB24 tablet    Urine Culture    Cervicovaginal ancillary only   [place] be structural?  Overactive bladder?  IC?  We will try overactive bladder medications but suspect that she will need a URO-GYN or urologist  3. Nocturia  R35.1 mirabegron ER (MYRBETRIQ) 25 MG TB24 tablet   see above  4. Encounter for hepatitis C screening test for low risk patient  Z11.59 Hepatitis C antibody    CANCELED: Hepatitis C Antibody   Agrees to screening  5. Screen for colon cancer  Z12.11 Ambulatory referral to Gastroenterology   Refer back to GI, previously established with Dr. Allen Norris  6. Encounter for screening mammogram for malignant neoplasm of breast  Z12.31 MM 3D SCREEN BREAST BILATERAL   Mammogram ordered patient encouraged to call when she has time  7. Leukocytosis, unspecified type  D72.829 CBC with Differential/Platelet    CANCELED: CBC with Differential/Platelet   Her labs show leukocytosis with increased lymphocytes and eosinophils-  history of intermittent elevated white blood cells in the past, recheck  8. Vitamin D deficiency  E55.9    Vitamin D deficiency improving on daily oral over-the-counter supplement encouraged her to double dose daily  9. Vitamin B12 deficiency  E53.8    low with last labs 295 - discussed B12 shots vs PO - with improved Vit D with PO, its hopefull that PO B12 will help improve, recheck 3 months or so  10. Collagenous colitis  K52.831 budesonide (ENTOCORT EC) 3 MG 24 hr capsule   Encouraged her to follow-up with GI refilled her prior GI medications to see if she can afford the medications now  11. Essential hypertension  I10 metoprolol succinate (TOPROL-XL) 25 MG 24 hr tablet    COMPLETE METABOLIC PANEL WITH GFR    CANCELED: COMPLETE METABOLIC PANEL WITH GFR   Well-controlled blood pressure, metoprolol not first-line but she has been on for some  time and it works for her, can continue same meds  12. History of recurrent UTIs  Z87.440 Urine Culture  13. AKI (acute kidney injury) ([place])  W10.2 COMPLETE METABOLIC PANEL WITH GFR  Increased sCr and decreased GFR - recheck labs  CANCELED: COMPLETE METABOLIC PANEL WITH GFR   Patient returning for follow-up of routine conditions after getting labs done last office visit when she was presenting for urinary symptoms and GI symptoms  Overall her routine conditions, hypertension hyperlipidemia are well controlled She has deficiencies of B12  and vitamin D which I hope will respond to oral supplementation, she had a history of iron deficiency but had no anemia and was normocytic She needs to have severe urinary frequency and nocturia even waking her up at night every hour and a half, she is getting poor sleep, and feels fatigued all day, is smoking more and drinking a lot of caffeine feels very exhausted and has increased work stress.  We have done urinalysis and urine cultures several times with mixed flora resulting, today she states that her urine stream is only a dribble with persistent bladder pressure and intermittent dysuria.  I have told her in the past and again explained to her that I do feel she needs a specialist to further evaluate her bladder emptying or possibly in assessing the structure.  Did offer to recheck her urine and try an overactive bladder medication but if no improvement she needs to see a specialist or subspecialist  We did do a quick GU exam today she does have what appears to be very weak vaginal walls and some prolapsing her urethra was normal-appearing, did get a vaginal swab to rule out any other infectious causes of urinary symptoms which I doubt based on her low risk history She Leach need pelvic floor therapy, pessary etc. to improve pelvic floor dysfunction which Apsey be contributory to her urinary symptoms  We discussed finding a URO-GYN specialists near her in the [place]  area -you Ghrist possibly try to find one at [facility] or [place]  Other diagnoses and plan as noted above  Return for 3-4 month f/up GI sx, B12, VitD, fatigue, urinary sx, HTN HLD.   Delsa Grana, PA-C 08/29/19 9:52 AM

## 2019-08-29 NOTE — Patient Instructions (Addendum)
Past urine testing showed "Growth of mixed flora was isolated, suggesting probable contamination"  Both times in [place]     Urinary Frequency, Adult Urinary frequency means urinating more often than usual. You Pitkin urinate every 1-2 hours even though you drink a normal amount of fluid and do not have a bladder infection or condition. Although you urinate more often than normal, the total amount of urine produced in a day is normal. With urinary frequency, you [place] have an urgent need to urinate often. The stress and anxiety of needing to find a bathroom quickly can make this urge worse. This condition Musial go away on its own or you Rede need treatment at home. Home treatment [place] include bladder training, exercises, taking medicines, or making changes to your diet. Follow these instructions at home: Bladder health   Keep a bladder diary if told by your health care provider. Keep track of: ? What you eat and drink. ? How often you urinate. ? How much you urinate.  Follow a bladder training program if told by your health care provider. This Mione include: ? Learning to delay going to the bathroom. ? Double urinating (voiding). This helps if you are not completely emptying your bladder. ? Scheduled voiding.  Do Kegel exercises as told by your health care provider. Kegel exercises strengthen the muscles that help control urination, which Reppert help the condition. Eating and drinking  If told by your health care provider, make diet changes, such as: ? Avoiding caffeine. ? Drinking fewer fluids, especially alcohol. ? Not drinking in the evening. ? Avoiding foods or drinks that Reagor irritate the bladder. These include coffee, tea, soda, artificial sweeteners, citrus, tomato-based foods, and chocolate. ? Eating foods that help prevent or ease constipation. Constipation can make this condition worse. Your health care provider Heringer recommend that you:  Drink enough fluid to keep your urine pale  yellow.  Take over-the-counter or prescription medicines.  Eat foods that are high in fiber, such as beans, whole grains, and fresh fruits and vegetables.  Limit foods that are high in fat and processed sugars, such as fried or sweet foods.  General instructions  Take over-the-counter and prescription medicines only as told by your health care provider.  Keep all follow-up visits as told by your health care provider. This is important. Contact a health care provider if:  You start urinating more often.  You feel pain or irritation when you urinate.  You notice blood in your urine.  Your urine looks cloudy.  You develop a fever.  You begin vomiting. Get help right away if:  You are unable to urinate. Summary  Urinary frequency means urinating more often than usual. With urinary frequency, you Nickle urinate every 1-2 hours even though you drink a normal amount of fluid and do not have a bladder infection or other bladder condition.  Your health care provider Dierolf recommend that you keep a bladder diary, follow a bladder training program, or make dietary changes.  If told by your health care provider, do Kegel exercises to strengthen the muscles that help control urination.  Take over-the-counter and prescription medicines only as told by your health care provider.  Contact a health care provider if your symptoms do not improve or get worse. This information is not intended to replace advice given to you by your health care provider. Make sure you discuss any questions you have with your health care provider. Document Revised: 09/15/2017 Document Reviewed: 09/15/2017 Elsevier Patient Education  [phone]  [place].   Follow up with GI please ASAP  Increase Vit D daily to 1000 IU twice a day or higher daily dose  Start an over the counter B12 supplement

## 2019-08-30 LAB — URINE CULTURE
MICRO NUMBER:: 10572019
SPECIMEN QUALITY:: ADEQUATE

## 2019-08-30 LAB — COMPLETE METABOLIC PANEL WITH GFR
AG Ratio: 1.8 (calc) (ref 1.0–2.5)
ALT: 14 U/L (ref 6–29)
AST: 13 U/L (ref 10–35)
Albumin: 4.2 g/dL (ref 3.6–5.1)
Alkaline phosphatase (APISO): 96 U/L (ref 37–153)
BUN: 17 mg/dL (ref 7–25)
CO2: 26 mmol/L (ref 20–32)
Calcium: 9.2 mg/dL (ref 8.6–10.4)
Chloride: 109 mmol/L (ref 98–110)
Creat: 0.83 mg/dL (ref 0.50–1.05)
GFR, Est African American: 92 mL/min/{1.73_m2} (ref >=60)
GFR, Est Non African American: 79 mL/min/{1.73_m2} (ref >=60)
Globulin: 2.3 g/dL (calc) (ref 1.9–3.7)
Glucose, Bld: 80 mg/dL (ref 65–99)
Potassium: 4.1 mmol/L (ref 3.5–5.3)
Sodium: 141 mmol/L (ref 135–146)
Total Bilirubin: 0.3 mg/dL (ref 0.2–1.2)
Total Protein: 6.5 g/dL (ref 6.1–8.1)

## 2019-08-30 LAB — CBC WITH DIFFERENTIAL/PLATELET
Absolute Monocytes: 448 cells/uL (ref 200–950)
Basophils Absolute: 34 cells/uL (ref 0–200)
Basophils Relative: 0.3 %
Eosinophils Absolute: 549 cells/uL — ABNORMAL HIGH (ref 15–500)
Eosinophils Relative: 4.9 %
HCT: 41.3 % (ref 35.0–45.0)
Hemoglobin: 13.2 g/dL (ref 11.7–15.5)
Lymphs Abs: 3853 cells/uL (ref 850–3900)
MCH: 29.2 pg (ref 27.0–33.0)
MCHC: 32 g/dL (ref 32.0–36.0)
MCV: 91.4 fL (ref 80.0–100.0)
MPV: 11.1 fL (ref 7.5–12.5)
Monocytes Relative: 4 %
Neutro Abs: 6317 cells/uL (ref 1500–7800)
Neutrophils Relative %: 56.4 %
Platelets: 336 10*3/uL (ref 140–400)
RBC: 4.52 10*6/uL (ref 3.80–5.10)
RDW: 12.6 % (ref 11.0–15.0)
Total Lymphocyte: 34.4 %
WBC: 11.2 10*3/uL — ABNORMAL HIGH (ref 3.8–10.8)

## 2019-08-30 LAB — CERVICOVAGINAL ANCILLARY ONLY
Bacterial Vaginitis (gardnerella): NEGATIVE
Candida Glabrata: POSITIVE — AB
Candida Vaginitis: NEGATIVE
Chlamydia: NEGATIVE
Comment: NEGATIVE
Comment: NEGATIVE
Comment: NEGATIVE
Comment: NEGATIVE
Comment: NEGATIVE
Comment: NORMAL
Neisseria Gonorrhea: NEGATIVE
Trichomonas: NEGATIVE

## 2019-08-30 LAB — HEPATITIS C ANTIBODY
Hepatitis C Ab: NONREACTIVE
SIGNAL TO CUT-OFF: 0.01 (ref <1.00)

## 2019-08-30 MED ORDER — FLUCONAZOLE 150 MG PO TABS
150.0000 mg | ORAL_TABLET | ORAL | 0 refills | Status: DC | PRN
Start: 2019-08-30 — End: 2019-10-12

## 2019-08-30 NOTE — Addendum Note (Signed)
Addended by: Delsa Grana on: 08/30/2019 05:53 PM   Modules accepted: Orders

## 2019-09-04 ENCOUNTER — Telehealth: Payer: Self-pay

## 2019-09-04 DIAGNOSIS — R350 Frequency of micturition: Secondary | ICD-10-CM

## 2019-09-04 DIAGNOSIS — R309 Painful micturition, unspecified: Secondary | ICD-10-CM

## 2019-09-04 DIAGNOSIS — R351 Nocturia: Secondary | ICD-10-CM

## 2019-09-04 NOTE — Telephone Encounter (Signed)
-----   Message from Delsa Grana, [place] sent at 09/03/2019  5:03 PM EDT ----- Urine culture shows gram-positive organism which is suspected to be colonization of the urinary system by microbes that are not normally found in the urinary system  Usually do not treat colonizing bacteria do feel it would be beneficial to see a urologist please let me know if she would like a referral

## 2019-09-07 ENCOUNTER — Encounter: Payer: Self-pay | Admitting: Urology

## 2019-09-07 ENCOUNTER — Ambulatory Visit (INDEPENDENT_AMBULATORY_CARE_PROVIDER_SITE_OTHER): Payer: 59 | Admitting: Urology

## 2019-09-07 ENCOUNTER — Other Ambulatory Visit: Payer: Self-pay

## 2019-09-07 VITALS — BP 155/85 | HR 96 | Ht 69.0 in | Wt 219.0 lb

## 2019-09-07 DIAGNOSIS — R350 Frequency of micturition: Secondary | ICD-10-CM | POA: Diagnosis not present

## 2019-09-07 LAB — BLADDER SCAN AMB NON-IMAGING: Scan Result: 11

## 2019-09-07 NOTE — Progress Notes (Signed)
09/07/2019 2:26 PM   Lind[place] Dolin Janice 1966-01-07 [id number]  Referring provider: Delsa Grana, PA-C [address] [place],   [address]  Chief Complaint  Patient presents with  . Urinary Frequency  . Dysuria  . Nocturia    HPI:  Neela was referred over for frequency, urgency and nocturia. She decreased tea and increased water. She had cholecystectomy in Jan 2021 and symptoms worsened. She was rx'd Myrbetriq but it was expensive. No gross hematuria. Abx didn't help. Then she had yeast infection. Standing can trigger. No urge incon. Mild with sneezing.   NG risk includes neck and back surgery with paresthesia in arms and legs. She has colitis and loose stools. She is status post abdominal hysterectomy. No prolapse symptoms.   PVR 11 ml today. UA pending. CT scan was done [place] 2020 which was benign.  Normal urinary tract.  I reviewed all the images.   PMH: Past Medical History:  Diagnosis Date  . CCC (chronic calculous cholecystitis) 04/03/2019  . Chronic diarrhea   . Chronic gastritis Sept. 2015   on EGD   . Collagenous colitis    colonoscopy biopsies 11/01/13  . Complication of anesthesia   . GERD (gastroesophageal reflux disease)   . History of kidney stones   . Hyperplastic polyps of stomach    Aug 2015  . Hypertension   . Neoplasm of uncertain behavior of skin 05/20/2016   Hx of skin cancer, BCC  . PONV (postoperative nausea and vomiting)   . Renal cyst     Surgical History: Past Surgical History:  Procedure Laterality Date  . ABDOMINAL HYSTERECTOMY  2005   partial, still has ovaries, due to fibroids.  . APPENDECTOMY    . [place]   x 4  . CHOLECYSTECTOMY N/A 04/11/2019   Procedure: LAPAROSCOPIC CHOLECYSTECTOMY;  Surgeon: Ronny Bacon, MD;  Location: [facility] ORS;  Service: General;  Laterality: N/A;  . COLONOSCOPY W/ BIOPSIES  11/01/13   collagenous colitis, sigmoid polypectomy x 2; hyperplastic polyps  .  ESOPHAGOGASTRODUODENOSCOPY  11/22/13   chronic gastritis  . HIP ARTHROPLASTY Bilateral 01/15/14,03/05/14   total hip, Dr. Nilsa Nutting  . LAPAROSCOPIC APPENDECTOMY N/A 08/20/2018   Procedure: APPENDECTOMY LAPAROSCOPIC;  Surgeon: Fredirick Maudlin, MD;  Location: [facility] ORS;  Service: General;  Laterality: N/A;  . NECK SURGERY  2015    Home Medications:  Allergies as of 09/07/2019      Reactions   Keflex [cephalexin] Shortness Of Breath, Palpitations   Sulfa Antibiotics Shortness Of Breath, Palpitations   Adhesive [tape]    Blisters   Gabapentin Nausea And Vomiting   Penicillins Rash   Did it involve swelling of the face/tongue/throat, SOB, or low BP? No Did it involve sudden or severe rash/hives, skin peeling, or any reaction on the inside of your mouth or nose? No Did you need to seek medical attention at a hospital or doctor's office? No When did it last happen?Childhood If all above answers are "NO", Cifelli proceed with cephalosporin use.      Medication List       Accurate as of September 07, 2019  2:26 PM. If you have any questions, ask your nurse or doctor.        STOP taking these medications   loperamide 2 MG capsule Commonly known as: IMODIUM Stopped by: Festus Aloe, MD     TAKE these medications   aspirin EC 81 MG tablet Take 1 tablet (81 mg total) by mouth daily.  atorvastatin 10 MG tablet Commonly known as: LIPITOR TAKE 1 TABLET BY MOUTH EVERYDAY AT BEDTIME   B-12 [id number] MCG Subl   budesonide 3 MG 24 hr capsule Commonly known as: ENTOCORT EC Take 3 capsules (9 mg total) by mouth daily.   buPROPion 300 MG 24 hr tablet Commonly known as: WELLBUTRIN XL Take 150 mg by mouth 2 (two) times daily.   fluconazole 150 MG tablet Commonly known as: DIFLUCAN Take 1 tablet (150 mg total) by mouth every 3 (three) days as needed (for vaginal itching/yeast infection sx).   metoprolol succinate 25 MG 24 hr tablet Commonly known as: TOPROL-XL Take 1 tablet (25 mg  total) by mouth daily with breakfast.   mirabegron ER 25 MG Tb24 tablet Commonly known as: MYRBETRIQ Take 1 tablet (25 mg total) by mouth daily.   multivitamin with minerals Tabs tablet Take 1 tablet by mouth daily.   Vitamin D (Cholecalciferol) 25 MCG (1000 UT) Tabs Take 1,000 Units by mouth daily.       Allergies:  Allergies  Allergen Reactions  . Keflex [Cephalexin] Shortness Of Breath and Palpitations  . Sulfa Antibiotics Shortness Of Breath and Palpitations  . Adhesive [Tape]     Blisters  . Gabapentin Nausea And Vomiting  . Penicillins Rash    Did it involve swelling of the face/tongue/throat, SOB, or low BP? No Did it involve sudden or severe rash/hives, skin peeling, or any reaction on the inside of your mouth or nose? No Did you need to seek medical attention at a hospital or doctor's office? No When did it last happen?Childhood If all above answers are "NO", Dickerson proceed with cephalosporin use.     Family History: Family History  Problem Relation Age of Onset  . Stroke Mother   . Glaucoma Mother   . Heart disease Father   . Cancer Father        prostate  . Heart disease Sister   . Diabetes Sister   . Diabetes Paternal Aunt   . Heart disease Paternal Aunt   . Bleeding Disorder Paternal Uncle   . Heart disease Sister        Psychologist, forensic  . Diabetes Paternal Aunt   . Heart disease Paternal Aunt     Social History:  reports that she has been smoking cigarettes. She has been smoking about 1.00 pack per day. She has never used smokeless tobacco. She reports that she does not drink alcohol and does not use drugs.   Physical Exam: BP (!) 155/85   Pulse 96   Ht 5' 9"  (1.753 m)   Wt 219 lb (99.3 kg)   BMI 32.34 kg/m   Constitutional:  Alert and oriented, No acute distress. HEENT: [place] AT, moist mucus membranes.  Trachea midline, no masses. Cardiovascular: No clubbing, cyanosis, or edema. Respiratory: Normal respiratory effort, no increased work of  breathing. GI: Abdomen is soft, nontender, nondistended, no abdominal masses GU: No CVA tenderness Skin: No rashes, bruises or suspicious lesions. Neurologic: Grossly intact, no focal deficits, moving all 4 extremities. Psychiatric: Normal mood and affect.  Laboratory Data: Lab Results  Component Value Date   WBC 11.2 (H) 08/29/2019   HGB 13.2 08/29/2019   HCT 41.3 08/29/2019   MCV 91.4 08/29/2019   PLT 336 08/29/2019    Lab Results  Component Value Date   CREATININE 0.83 08/29/2019    No results found for: PSA  No results found for: TESTOSTERONE  No results found for: HGBA1C  Urinalysis  Component Value Date/Time   COLORURINE YELLOW (A) 03/26/2019 0802   APPEARANCEUR HAZY (A) 03/26/2019 0802   APPEARANCEUR Clear 11/07/2012 2004   LABSPEC 1.021 03/26/2019 0802   LABSPEC 1.016 11/07/2012 2004   PHURINE 5.0 03/26/2019 0802   GLUCOSEU NEGATIVE 03/26/2019 0802   GLUCOSEU Negative 11/07/2012 2004   HGBUR NEGATIVE 03/26/2019 0802   BILIRUBINUR neg 08/29/2019 0959   BILIRUBINUR Negative 11/07/2012 2004   KETONESUR NEGATIVE 03/26/2019 0802   PROTEINUR Negative 08/29/2019 0959   PROTEINUR NEGATIVE 03/26/2019 0802   UROBILINOGEN 0.2 08/29/2019 0959   NITRITE neg 08/29/2019 0959   NITRITE NEGATIVE 03/26/2019 0802   LEUKOCYTESUR Trace (A) 08/29/2019 0959   LEUKOCYTESUR NEGATIVE 03/26/2019 0802   LEUKOCYTESUR Trace 11/07/2012 2004    Lab Results  Component Value Date   BACTERIA RARE (A) 03/26/2019    Pertinent Imaging: n/a No results found for this or any previous visit.  No results found for this or any previous visit.  No results found for this or any previous visit.  No results found for this or any previous visit.  No results found for this or any previous visit.  No results found for this or any previous visit.  No results found for this or any previous visit.  No results found for this or any previous visit.   Assessment & Plan:    1. Urinary  frequency Start trial of solifenacin. F/u for exam and cystoscopy.  - Urinalysis, Complete - BLADDER SCAN AMB NON-IMAGING   No follow-ups on file.  Festus Aloe, MD  [facility] [address] [place], [place] [address] [phone]

## 2019-09-07 NOTE — Patient Instructions (Signed)
Overactive Bladder, Adult  Overactive bladder refers to a condition in which a person has a sudden need to pass urine. The person Thalman leak urine if he or she cannot get to the bathroom fast enough (urinary incontinence). A person with this condition Ekstrom also wake up several times in the night to go to the bathroom. Overactive bladder is associated with poor nerve signals between your bladder and your brain. Your bladder Cadle get the signal to empty before it is full. You Pacifico also have very sensitive muscles that make your bladder squeeze too soon. These symptoms might interfere with daily work or social activities. What are the causes? This condition Simkins be associated with or caused by:  Urinary tract infection.  Infection of nearby tissues, such as the prostate.  Prostate enlargement.  Surgery on the uterus or urethra.  Bladder stones, inflammation, or tumors.  Drinking too much caffeine or alcohol.  Certain medicines, especially medicines that get rid of extra fluid in the body (diuretics).  Muscle or nerve weakness, especially from: ? A spinal cord injury. ? Stroke. ? Multiple sclerosis. ? Parkinson's disease.  Diabetes.  Constipation. What increases the risk? You Ding be at greater risk for overactive bladder if you:  Are an older adult.  Smoke.  Are going through menopause.  Have prostate problems.  Have a neurological disease, such as stroke, dementia, Parkinson's disease, or multiple sclerosis (MS).  Eat or drink things that irritate the bladder. These include alcohol, spicy food, and caffeine.  Are overweight or obese. What are the signs or symptoms? Symptoms of this condition include:  Sudden, strong urge to urinate.  Leaking urine.  Urinating 8 or more times a day.  Waking up to urinate 2 or more times a night. How is this diagnosed? Your health care provider Betts suspect overactive bladder based on your symptoms. He or she will diagnose this condition  by:  A physical exam and medical history.  Blood or urine tests. You might need bladder or urine tests to help determine what is causing your overactive bladder. You might also need to see a health care provider who specializes in urinary tract problems (urologist). How is this treated? Treatment for overactive bladder depends on the cause of your condition and whether it is mild or severe. You can also make lifestyle changes at home. Options include:  Bladder training. This Vandervort include: ? Learning to control the urge to urinate by following a schedule that directs you to urinate at regular intervals (timed voiding). ? Doing Kegel exercises to strengthen your pelvic floor muscles, which support your bladder. Toning these muscles can help you control urination, even if your bladder muscles are overactive.  Special devices. This Bickert include: ? Biofeedback, which uses sensors to help you become aware of your body's signals. ? Electrical stimulation, which uses electrodes placed inside the body (implanted) or outside the body. These electrodes send gentle pulses of electricity to strengthen the nerves or muscles that control the bladder. ? Women Sutcliffe use a plastic device that fits into the vagina and supports the bladder (pessary).  Medicines. ? Antibiotics to treat bladder infection. ? Antispasmodics to stop the bladder from releasing urine at the wrong time. ? Tricyclic antidepressants to relax bladder muscles. ? Injections of botulinum toxin type A directly into the bladder tissue to relax bladder muscles.  Lifestyle changes. This Bol[place] include: ? Weight loss. Talk to your health care provider about weight loss methods that would work best for you. ?   Diet changes. This Everton include reducing how much alcohol and caffeine you consume, or drinking fluids at different times of the day. ? Not smoking. Do not use any products that contain nicotine or tobacco, such as cigarettes and e-cigarettes. If  you need help quitting, ask your health care provider.  Surgery. ? A device Mooty be implanted to help manage the nerve signals that control urination. ? An electrode Renaud be implanted to stimulate electrical signals in the bladder. ? A procedure Tangonan be done to change the shape of the bladder. This is done only in very severe cases. Follow these instructions at home: Lifestyle  Make any diet or lifestyle changes that are recommended by your health care provider. These Dillard include: ? Drinking less fluid or drinking fluids at different times of the day. ? Cutting down on caffeine or alcohol. ? Doing Kegel exercises. ? Losing weight if needed. ? Eating a healthy and balanced diet to prevent constipation. This Brickhouse include:  Eating foods that are high in fiber, such as fresh fruits and vegetables, whole grains, and beans.  Limiting foods that are high in fat and processed sugars, such as fried and sweet foods. General instructions  Take over-the-counter and prescription medicines only as told by your health care provider.  If you were prescribed an antibiotic medicine, take it as told by your health care provider. Do not stop taking the antibiotic even if you start to feel better.  Use any implants or pessary as told by your health care provider.  If needed, wear pads to absorb urine leakage.  Keep a journal or log to track how much and when you drink and when you feel the need to urinate. This will help your health care provider monitor your condition.  Keep all follow-up visits as told by your health care provider. This is important. Contact a health care provider if:  You have a fever.  Your symptoms do not get better with treatment.  Your pain and discomfort get worse.  You have more frequent urges to urinate. Get help right away if:  You are not able to control your bladder. Summary  Overactive bladder refers to a condition in which a person has a sudden need to pass  urine.  Several conditions Duer lead to an overactive bladder.  Treatment for overactive bladder depends on the cause and severity of your condition.  Follow your health care provider's instructions about lifestyle changes, doing Kegel exercises, keeping a journal, and taking medicines. This information is not intended to replace advice given to you by your health care provider. Make sure you discuss any questions you have with your health care provider. Document Revised: 06/29/2018 Document Reviewed: 03/24/2017 Elsevier Patient Education  2020 Elsevier Inc.  

## 2019-09-10 ENCOUNTER — Encounter: Payer: Self-pay | Admitting: Family Medicine

## 2019-09-10 LAB — URINALYSIS, COMPLETE
Bilirubin, UA: NEGATIVE
Glucose, UA: NEGATIVE
Ketones, UA: NEGATIVE
Leukocytes,UA: NEGATIVE
Nitrite, UA: NEGATIVE
Protein,UA: NEGATIVE
Specific Gravity, UA: 1.025 (ref 1.005–1.030)
Urobilinogen, Ur: 0.2 mg/dL (ref 0.2–1.0)
pH, UA: 5.5 (ref 5.0–7.5)

## 2019-09-10 LAB — MICROSCOPIC EXAMINATION

## 2019-09-11 ENCOUNTER — Encounter: Payer: Self-pay | Admitting: *Deleted

## 2019-09-11 MED ORDER — OXYBUTYNIN CHLORIDE ER 10 MG PO TB24
10.0000 mg | ORAL_TABLET | Freq: Every day | ORAL | 2 refills | Status: DC
Start: 2019-09-11 — End: 2019-11-19

## 2019-10-12 ENCOUNTER — Encounter: Payer: Self-pay | Admitting: Urology

## 2019-10-12 ENCOUNTER — Ambulatory Visit (INDEPENDENT_AMBULATORY_CARE_PROVIDER_SITE_OTHER): Payer: 59 | Admitting: Urology

## 2019-10-12 ENCOUNTER — Other Ambulatory Visit: Payer: Self-pay | Admitting: Radiology

## 2019-10-12 ENCOUNTER — Other Ambulatory Visit: Payer: Self-pay

## 2019-10-12 VITALS — BP 146/81 | HR 80 | Ht 69.0 in | Wt 222.6 lb

## 2019-10-12 DIAGNOSIS — R350 Frequency of micturition: Secondary | ICD-10-CM | POA: Diagnosis not present

## 2019-10-12 DIAGNOSIS — D414 Neoplasm of uncertain behavior of bladder: Secondary | ICD-10-CM | POA: Diagnosis not present

## 2019-10-12 LAB — URINALYSIS, COMPLETE
Bilirubin, UA: NEGATIVE
Glucose, UA: NEGATIVE
Ketones, UA: NEGATIVE
Nitrite, UA: NEGATIVE
Protein,UA: NEGATIVE
Specific Gravity, UA: 1.015 (ref 1.005–1.030)
Urobilinogen, Ur: 0.2 mg/dL (ref 0.2–1.0)
pH, UA: 5 (ref 5.0–7.5)

## 2019-10-12 LAB — MICROSCOPIC EXAMINATION

## 2019-10-12 MED ORDER — LIDOCAINE HCL URETHRAL/MUCOSAL 2 % EX GEL
1.0000 "application " | Freq: Once | CUTANEOUS | Status: AC
  Administered 2019-10-12: 1 via URETHRAL

## 2019-10-12 MED ORDER — GEMCITABINE CHEMO FOR BLADDER INSTILLATION 2000 MG: 2000.0000 mg | Freq: Once | INTRAVENOUS | Status: DC

## 2019-10-12 NOTE — H&P (View-Only) (Signed)
   10/12/19  CC: No chief complaint on file.   HPI:  F/u -   1) frequency, urgency and nocturia. She decreased tea and increased water. She had cholecystectomy in Jan 2021 and symptoms worsened. She was rx'd Myrbetriq but it was expensive. No gross hematuria. Abx didn't help. Then she had yeast infection. Standing can trigger. No urge incon. Mild with sneezing.   NG risk includes neck and back surgery with paresthesia in arms and legs. She has colitis and loose stools. She is status post abdominal hysterectomy. No prolapse symptoms.   PVR 11 ml today. UA with no rbc, few bacteria. CT scan was done [place] 2020 which was benign.  Normal urinary tract.  She returns for cystoscopy. She started solifenacin. UA with no rbc's. Still voiding q 15 minutes and bladder pain.   There were no vitals taken for this visit. NED. A&Ox3.   No respiratory distress   Abd soft, NT, ND Normal external genitalia with patent urethral meatus  Cystoscopy Procedure Note  Patient identification was confirmed, informed consent was obtained, and patient was prepped using Betadine solution.  Lidocaine jelly was administered per urethral meatus.    Procedure: - Flexible cystoscope introduced, without any difficulty.   - Thorough search of the bladder revealed:    normal urethral meatus    normal urothelium    no stones    no ulcers     A posterior wall tumor, a cluster of dome to anterior tumors     no urethral polyps    no trabeculation  - Ureteral orifices were normal in position and appearance.  Charlott Rakes was chaperone   Post-Procedure: - Patient tolerated the procedure well  Assessment/ Plan:  Bladder neoplasm-patient was shown the tumors on the monitor during the flexible cystoscopy.  We discussed the nature risk benefits and alternatives to TURBT with postop instillation of gemcitabine.  All questions answered.  She elects to proceed.  Discussed one of my colleagues would be doing the procedure. Also,  it's been over a year since she had a CT , so will set up a new one.    No follow-ups on file.  Festus Aloe, MD

## 2019-10-12 NOTE — Patient Instructions (Signed)
   Cystoscopy  Cystoscopy is a procedure that is used to help diagnose and sometimes treat conditions that affect the lower urinary tract. The lower urinary tract includes the bladder and the urethra. The urethra is the tube that drains urine from the bladder. Cystoscopy is done using a thin, tube-shaped instrument with a light and camera at the end (cystoscope). The cystoscope Liebler be hard or flexible, depending on the goal of the procedure. The cystoscope is inserted through the urethra, into the bladder.  Cystoscopy Suleiman be recommended if you have:  · Urinary tract infections that keep coming back.  · Blood in the urine (hematuria).  · An inability to control when you urinate (urinary incontinence) or an overactive bladder.  · Unusual cells found in a urine sample.  · A blockage in the urethra, such as a urinary stone.  · Painful urination.  · An abnormality in the bladder found during an intravenous pyelogram (IVP) or CT scan.  Cystoscopy Garceau also be done to remove a sample of tissue to be examined under a microscope (biopsy).  Tell a health care provider about:  · Any allergies you have.  · All medicines you are taking, including vitamins, herbs, eye drops, creams, and over-the-counter medicines.  · Any problems you or family members have had with anesthetic medicines.  · Any blood disorders you have.  · Any surgeries you have had.  · Any medical conditions you have.  · Whether you are pregnant or Cimmino be pregnant.  What are the risks?  Generally, this is a safe procedure. However, problems Bommarito occur, including:  · Infection.  · Bleeding.  · Allergic reactions to medicines.  · Damage to other structures or organs.  What happens before the procedure?  · Ask your health care provider about:  ? Changing or stopping your regular medicines. This is especially important if you are taking diabetes medicines or blood thinners.  ? Taking medicines such as aspirin and ibuprofen. These medicines can thin your blood. Do not  take these medicines unless your health care provider tells you to take them.  ? Taking over-the-counter medicines, vitamins, herbs, and supplements.  · Follow instructions from your health care provider about eating or drinking restrictions.  · Ask your health care provider what steps will be taken to help prevent infection. These Shaheen include:  ? Washing skin with a germ-killing soap.  ? Taking antibiotic medicine.  · You Everett have an exam or testing, such as:  ? X-rays of the bladder, urethra, or kidneys.  ? Urine tests to check for signs of infection.  · Plan to have someone take you home from the hospital or clinic.  What happens during the procedure?       · You will be given one or more of the following:  ? A medicine to help you relax (sedative).  ? A medicine to numb the area (local anesthetic).  · The area around the opening of your urethra will be cleaned.  · The cystoscope will be passed through your urethra into your bladder.  · Germ-free (sterile) fluid will flow through the cystoscope to fill your bladder. The fluid will stretch your bladder so that your health care provider can clearly examine your bladder walls.  · Your doctor will look at the urethra and bladder. Your doctor Menees take a biopsy or remove stones.  · The cystoscope will be removed, and your bladder will be emptied.  The procedure Sandner vary among health care   providers and hospitals.  What can I expect after the procedure?  After the procedure, it is common to have:  · Some soreness or pain in your abdomen and urethra.  · Urinary symptoms. These include:  ? Mild pain or burning when you urinate. Pain should stop within a few minutes after you urinate. This Paxson last for up to 1 week.  ? A small amount of blood in your urine for several days.  ? Feeling like you need to urinate but producing only a small amount of urine.  Follow these instructions at home:  Medicines  · Take over-the-counter and prescription medicines only as told by your  health care provider.  · If you were prescribed an antibiotic medicine, take it as told by your health care provider. Do not stop taking the antibiotic even if you start to feel better.  General instructions  · Return to your normal activities as told by your health care provider. Ask your health care provider what activities are safe for you.  · Do not drive for 24 hours if you were given a sedative during your procedure.  · Watch for any blood in your urine. If the amount of blood in your urine increases, call your health care provider.  · Follow instructions from your health care provider about eating or drinking restrictions.  · If a tissue sample was removed for testing (biopsy) during your procedure, it is up to you to get your test results. Ask your health care provider, or the department that is doing the test, when your results will be ready.  · Drink enough fluid to keep your urine pale yellow.  · Keep all follow-up visits as told by your health care provider. This is important.  Contact a health care provider if you:  · Have pain that gets worse or does not get better with medicine, especially pain when you urinate.  · Have trouble urinating.  · Have more blood in your urine.  Get help right away if you:  · Have blood clots in your urine.  · Have abdominal pain.  · Have a fever or chills.  · Are unable to urinate.  Summary  · Cystoscopy is a procedure that is used to help diagnose and sometimes treat conditions that affect the lower urinary tract.  · Cystoscopy is done using a thin, tube-shaped instrument with a light and camera at the end.  · After the procedure, it is common to have some soreness or pain in your abdomen and urethra.  · Watch for any blood in your urine. If the amount of blood in your urine increases, call your health care provider.  · If you were prescribed an antibiotic medicine, take it as told by your health care provider. Do not stop taking the antibiotic even if you start to feel  better.  This information is not intended to replace advice given to you by your health care provider. Make sure you discuss any questions you have with your health care provider.  Document Revised: 02/28/2018 Document Reviewed: 02/28/2018  Elsevier Patient Education © 2020 Elsevier Inc.   

## 2019-10-12 NOTE — Progress Notes (Signed)
   10/12/19  CC: No chief complaint on file.   HPI:  F/u -   1) frequency, urgency and nocturia. She decreased tea and increased water. She had cholecystectomy in Jan 2021 and symptoms worsened. She was rx'd Myrbetriq but it was expensive. No gross hematuria. Abx didn't help. Then she had yeast infection. Standing can trigger. No urge incon. Mild with sneezing.   NG risk includes neck and back surgery with paresthesia in arms and legs. She has colitis and loose stools. She is status post abdominal hysterectomy. No prolapse symptoms.   PVR 11 ml today. UA with no rbc, few bacteria. CT scan was done Boehm 2020 which was benign.  Normal urinary tract.  She returns for cystoscopy. She started solifenacin. UA with no rbc's. Still voiding q 15 minutes and bladder pain.   There were no vitals taken for this visit. NED. A&Ox3.   No respiratory distress   Abd soft, NT, ND Normal external genitalia with patent urethral meatus  Cystoscopy Procedure Note  Patient identification was confirmed, informed consent was obtained, and patient was prepped using Betadine solution.  Lidocaine jelly was administered per urethral meatus.    Procedure: - Flexible cystoscope introduced, without any difficulty.   - Thorough search of the bladder revealed:    normal urethral meatus    normal urothelium    no stones    no ulcers     A posterior wall tumor, a cluster of dome to anterior tumors     no urethral polyps    no trabeculation  - Ureteral orifices were normal in position and appearance.  Charlott Rakes was chaperone   Post-Procedure: - Patient tolerated the procedure well  Assessment/ Plan:  Bladder neoplasm-patient was shown the tumors on the monitor during the flexible cystoscopy.  We discussed the nature risk benefits and alternatives to TURBT with postop instillation of gemcitabine.  All questions answered.  She elects to proceed.  Discussed one of my colleagues would be doing the procedure. Also,  it's been over a year since she had a CT , so will set up a new one.    No follow-ups on file.  Festus Aloe, MD

## 2019-10-17 ENCOUNTER — Other Ambulatory Visit: Payer: Self-pay

## 2019-10-17 ENCOUNTER — Other Ambulatory Visit: Payer: 59

## 2019-10-17 DIAGNOSIS — D414 Neoplasm of uncertain behavior of bladder: Secondary | ICD-10-CM

## 2019-10-17 LAB — CULTURE, URINE COMPREHENSIVE

## 2019-10-17 NOTE — Addendum Note (Signed)
Addended by: Alvera Novel on: 10/17/2019 04:04 PM   Modules accepted: Orders

## 2019-10-19 ENCOUNTER — Other Ambulatory Visit: Payer: Self-pay

## 2019-10-19 ENCOUNTER — Other Ambulatory Visit: Payer: 59

## 2019-10-19 DIAGNOSIS — D414 Neoplasm of uncertain behavior of bladder: Secondary | ICD-10-CM

## 2019-10-19 LAB — MICROSCOPIC EXAMINATION

## 2019-10-19 LAB — URINALYSIS, COMPLETE
Bilirubin, UA: NEGATIVE
Glucose, UA: NEGATIVE
Ketones, UA: NEGATIVE
Nitrite, UA: NEGATIVE
Protein,UA: NEGATIVE
Specific Gravity, UA: >1.03 — ABNORMAL HIGH (ref 1.005–1.030)
Urobilinogen, Ur: 0.2 mg/dL (ref 0.2–1.0)
pH, UA: 5 (ref 5.0–7.5)

## 2019-10-23 ENCOUNTER — Encounter
Admission: RE | Admit: 2019-10-23 | Discharge: 2019-10-23 | Disposition: A | Discharge status: Home / Self Care | Payer: 59 | Source: Ambulatory Visit | Attending: Urology | Admitting: Urology

## 2019-10-23 ENCOUNTER — Other Ambulatory Visit: Payer: Self-pay

## 2019-10-23 HISTORY — DX: Other cervical disc degeneration, unspecified cervical region: M50.30

## 2019-10-23 HISTORY — DX: Anemia, unspecified: D64.9

## 2019-10-23 HISTORY — DX: Malignant (primary) neoplasm, unspecified: C80.1

## 2019-10-23 NOTE — Patient Instructions (Signed)
Your procedure is scheduled on: 10-29-19 MONDAY Report to Same Day Surgery 2nd floor medical mall [facility] Entrance-take elevator on left to 2nd floor.  Check in with surgery information desk.) To find out your arrival time please call [phone] between 1PM - 3PM on 10-26-19 FRIDAY  Remember: Instructions that are not followed completely [place] result in serious medical risk, up to and including death, or upon the discretion of your surgeon and anesthesiologist your surgery [place] need to be rescheduled.    _x___ 1. Do not eat food after midnight the night before your procedure. NO GUM OR CANDY AFTER MIDNIGHT. You Kanaan drink clear liquids up to 2 hours before you are scheduled to arrive at the hospital for your procedure.  Do not drink clear liquids within 2 hours of your scheduled arrival to the hospital.  Clear liquids include  --Water or Apple juice without pulp  --Gatorade  --Black Coffee or Clear Tea (No milk, no creamers, do not add anything to the coffee or Tea-ok to add sugar)     __x__ 2. No Alcohol for 24 hours before or after surgery.   __x__3. No Smoking or e-cigarettes for 24 prior to surgery.  Do not use any chewable tobacco products for at least 6 hour prior to surgery   ____  4. Bring all medications with you on the day of surgery if instructed.    __x__ 5. Notify your doctor if there is any change in your medical condition     (cold, fever, infections).    x___6. On the morning of surgery brush your teeth with toothpaste and water.  You Marez rinse your mouth with mouth wash if you wish.  Do not swallow any toothpaste or mouthwash.   Do not wear jewelry, make-up, hairpins, clips or nail polish.  Do not wear lotions, powders, or perfumes. You Hunley wear deodorant.  Do not shave 48 hours prior to surgery. Men [place] shave face and neck.  Do not bring valuables to the hospital.    [facility] is not responsible for any belongings or valuables.               Contacts, dentures  or bridgework Kirtz not be worn into surgery.  Leave your suitcase in the car. After surgery it Loree be brought to your room.  For patients admitted to the hospital, discharge time is determined by your  treatment team.  _  Patients discharged the day of surgery will not be allowed to drive home.  You will need someone to drive you home and stay with you the night of your procedure.    Please read over the following fact sheets that you were given:   [facility] Preparing for Surgery   _x___ TAKE THE FOLLOWING MEDICATION THE MORNING OF SURGERY WITH A SMALL SIP OF WATER. These include:  1. BUPROPION (WELLBUTRIN)  2.  3.  4.  5.  6.  ____Fleets enema or Magnesium Citrate as directed.   ____ Use CHG Soap or sage wipes as directed on instruction sheet   ____ Use inhalers on the day of surgery and bring to hospital day of surgery  ____ Stop Metformin and Janumet 2 days prior to surgery.    ____ Take 1/2 of usual insulin dose the night before surgery and none on the morning surgery.   _x___ Follow recommendations from Cardiologist, Pulmonologist or PCP regarding stopping Aspirin, Coumadin, Plavix ,Eliquis, Effient, or Pradaxa, and Pletal-LAST DOSE OF ASPIRIN WAS 10-22-19  X____Stop Anti-inflammatories such as Advil, Aleve, Ibuprofen, Motrin, Naproxen, Naprosyn, Goodies powders or aspirin products NOW-OK to take Tylenol    ____ Stop supplements until after surgery.     ____ Bring C-Pap to the hospital.

## 2019-10-24 LAB — CULTURE, URINE COMPREHENSIVE

## 2019-10-25 ENCOUNTER — Other Ambulatory Visit: Admission: RE | Admit: 2019-10-25 | Payer: 59 | Source: Ambulatory Visit

## 2019-10-25 ENCOUNTER — Other Ambulatory Visit: Payer: Self-pay

## 2019-10-25 ENCOUNTER — Telehealth: Payer: Self-pay

## 2019-10-25 ENCOUNTER — Encounter
Admission: RE | Admit: 2019-10-25 | Discharge: 2019-10-25 | Disposition: A | Discharge status: Home / Self Care | Payer: 59 | Source: Ambulatory Visit | Attending: Urology | Admitting: Urology

## 2019-10-25 DIAGNOSIS — I10 Essential (primary) hypertension: Secondary | ICD-10-CM

## 2019-10-25 DIAGNOSIS — Z01812 Encounter for preprocedural laboratory examination: Secondary | ICD-10-CM | POA: Diagnosis not present

## 2019-10-25 DIAGNOSIS — Z20822 Contact with and (suspected) exposure to covid-19: Secondary | ICD-10-CM | POA: Insufficient documentation

## 2019-10-25 DIAGNOSIS — Z01810 Encounter for preprocedural cardiovascular examination: Secondary | ICD-10-CM | POA: Diagnosis present

## 2019-10-25 MED ORDER — NITROFURANTOIN MONOHYD MACRO 100 MG PO CAPS
100.0000 mg | ORAL_CAPSULE | Freq: Two times a day (BID) | ORAL | 0 refills | Status: DC
Start: 2019-10-25 — End: 2019-11-19

## 2019-10-25 NOTE — Telephone Encounter (Signed)
Patient notified and script sent

## 2019-10-25 NOTE — Telephone Encounter (Signed)
-----   Message from [name], MD sent at 10/24/2019  3:37 PM EDT ----- Preop urine culture grew lactobacillus. Generally this is a vaginal contaminant but given that the urine had a few white blood cells, would prefer to treat prior to surgery. Please prescribe her Macrobid 100 mg twice daily for 7 days. She can go ahead and start this medication today or tomorrow.  [name], MD

## 2019-10-26 LAB — SARS CORONAVIRUS 2 (TAT 6-24 HRS): SARS Coronavirus 2: NEGATIVE

## 2019-10-29 ENCOUNTER — Telehealth: Payer: Self-pay | Admitting: Urology

## 2019-10-29 ENCOUNTER — Encounter
Admission: RE | Disposition: A | Discharge status: Home / Self Care | Payer: Self-pay | Source: Home / Self Care | Attending: Urology

## 2019-10-29 ENCOUNTER — Ambulatory Visit: Payer: 59 | Admitting: Certified Registered Nurse Anesthetist

## 2019-10-29 ENCOUNTER — Ambulatory Visit
Admission: RE | Admit: 2019-10-29 | Discharge: 2019-10-29 | Disposition: A | Discharge status: Home / Self Care | Payer: 59 | Attending: Urology | Admitting: Urology

## 2019-10-29 ENCOUNTER — Encounter: Payer: Self-pay | Admitting: Urology

## 2019-10-29 ENCOUNTER — Other Ambulatory Visit: Payer: Self-pay

## 2019-10-29 DIAGNOSIS — M1990 Unspecified osteoarthritis, unspecified site: Secondary | ICD-10-CM | POA: Diagnosis not present

## 2019-10-29 DIAGNOSIS — Z79899 Other long term (current) drug therapy: Secondary | ICD-10-CM | POA: Insufficient documentation

## 2019-10-29 DIAGNOSIS — I10 Essential (primary) hypertension: Secondary | ICD-10-CM | POA: Diagnosis not present

## 2019-10-29 DIAGNOSIS — D414 Neoplasm of uncertain behavior of bladder: Secondary | ICD-10-CM

## 2019-10-29 DIAGNOSIS — D494 Neoplasm of unspecified behavior of bladder: Secondary | ICD-10-CM

## 2019-10-29 DIAGNOSIS — Z87891 Personal history of nicotine dependence: Secondary | ICD-10-CM | POA: Diagnosis not present

## 2019-10-29 DIAGNOSIS — N3281 Overactive bladder: Secondary | ICD-10-CM | POA: Diagnosis present

## 2019-10-29 DIAGNOSIS — Z87442 Personal history of urinary calculi: Secondary | ICD-10-CM | POA: Diagnosis not present

## 2019-10-29 DIAGNOSIS — M5030 Other cervical disc degeneration, unspecified cervical region: Secondary | ICD-10-CM | POA: Diagnosis not present

## 2019-10-29 DIAGNOSIS — C671 Malignant neoplasm of dome of bladder: Secondary | ICD-10-CM | POA: Insufficient documentation

## 2019-10-29 HISTORY — PX: TRANSURETHRAL RESECTION OF BLADDER TUMOR WITH MITOMYCIN-C: SHX6459

## 2019-10-29 SURGERY — TRANSURETHRAL RESECTION OF BLADDER TUMOR WITH MITOMYCIN-C
Anesthesia: General | Site: Bladder

## 2019-10-29 MED ORDER — ONDANSETRON HCL 4 MG/2ML IJ SOLN
INTRAMUSCULAR | Status: AC
  Filled 2019-10-29: qty 2

## 2019-10-29 MED ORDER — CIPROFLOXACIN IN D5W 400 MG/200ML IV SOLN
INTRAVENOUS | Status: AC
  Filled 2019-10-29: qty 200

## 2019-10-29 MED ORDER — FENTANYL CITRATE (PF) 100 MCG/2ML IJ SOLN
INTRAMUSCULAR | Status: AC
  Filled 2019-10-29: qty 2

## 2019-10-29 MED ORDER — FENTANYL CITRATE (PF) 100 MCG/2ML IJ SOLN
INTRAMUSCULAR | Status: DC | PRN
  Administered 2019-10-29 (×4): 25 ug via INTRAVENOUS
  Administered 2019-10-29: 50 ug via INTRAVENOUS

## 2019-10-29 MED ORDER — LACTATED RINGERS IV SOLN: INTRAVENOUS | Status: DC

## 2019-10-29 MED ORDER — OXYCODONE-ACETAMINOPHEN 5-325 MG PO TABS
ORAL_TABLET | ORAL | Status: AC
  Filled 2019-10-29: qty 1

## 2019-10-29 MED ORDER — PROPOFOL 10 MG/ML IV BOLUS
INTRAVENOUS | Status: DC | PRN
  Administered 2019-10-29: 200 mg via INTRAVENOUS

## 2019-10-29 MED ORDER — FAMOTIDINE 20 MG PO TABS: 20.0000 mg | ORAL_TABLET | Freq: Once | ORAL | Status: AC

## 2019-10-29 MED ORDER — LIDOCAINE HCL (CARDIAC) PF 100 MG/5ML IV SOSY
PREFILLED_SYRINGE | INTRAVENOUS | Status: DC | PRN
  Administered 2019-10-29: 100 mg via INTRAVENOUS

## 2019-10-29 MED ORDER — CHLORHEXIDINE GLUCONATE 0.12 % MT SOLN: 15.0000 mL | Freq: Once | OROMUCOSAL | Status: AC

## 2019-10-29 MED ORDER — PROPOFOL 10 MG/ML IV BOLUS
INTRAVENOUS | Status: AC
  Filled 2019-10-29: qty 20

## 2019-10-29 MED ORDER — OXYCODONE-ACETAMINOPHEN 5-325 MG PO TABS: 1.0000 | ORAL_TABLET | ORAL | 0 refills | Status: DC | PRN

## 2019-10-29 MED ORDER — OXYCODONE-ACETAMINOPHEN 5-325 MG PO TABS
1.0000 | ORAL_TABLET | ORAL | Status: DC | PRN
  Administered 2019-10-29: 1 via ORAL

## 2019-10-29 MED ORDER — ONDANSETRON HCL 4 MG/2ML IJ SOLN
INTRAMUSCULAR | Status: DC | PRN
  Administered 2019-10-29: 4 mg via INTRAVENOUS

## 2019-10-29 MED ORDER — DEXAMETHASONE SODIUM PHOSPHATE 10 MG/ML IJ SOLN
INTRAMUSCULAR | Status: AC
  Filled 2019-10-29: qty 1

## 2019-10-29 MED ORDER — FENTANYL CITRATE (PF) 100 MCG/2ML IJ SOLN
25.0000 ug | INTRAMUSCULAR | Status: DC | PRN
  Administered 2019-10-29 (×2): 50 ug via INTRAVENOUS

## 2019-10-29 MED ORDER — CHLORHEXIDINE GLUCONATE 0.12 % MT SOLN
OROMUCOSAL | Status: AC
  Administered 2019-10-29: 15 mL via OROMUCOSAL
  Filled 2019-10-29: qty 15

## 2019-10-29 MED ORDER — FAMOTIDINE 20 MG PO TABS
ORAL_TABLET | ORAL | Status: AC
  Administered 2019-10-29: 20 mg via ORAL
  Filled 2019-10-29: qty 1

## 2019-10-29 MED ORDER — LIDOCAINE HCL (PF) 2 % IJ SOLN
INTRAMUSCULAR | Status: AC
  Filled 2019-10-29: qty 5

## 2019-10-29 MED ORDER — MIDAZOLAM HCL 2 MG/2ML IJ SOLN
INTRAMUSCULAR | Status: AC
  Filled 2019-10-29: qty 2

## 2019-10-29 MED ORDER — DEXAMETHASONE SODIUM PHOSPHATE 10 MG/ML IJ SOLN
INTRAMUSCULAR | Status: DC | PRN
  Administered 2019-10-29: 10 mg via INTRAVENOUS

## 2019-10-29 MED ORDER — MIDAZOLAM HCL 2 MG/2ML IJ SOLN
INTRAMUSCULAR | Status: DC | PRN
  Administered 2019-10-29: 2 mg via INTRAVENOUS

## 2019-10-29 MED ORDER — CIPROFLOXACIN IN D5W 400 MG/200ML IV SOLN
400.0000 mg | INTRAVENOUS | Status: AC
  Administered 2019-10-29: 400 mg via INTRAVENOUS

## 2019-10-29 MED ORDER — ORAL CARE MOUTH RINSE: 15.0000 mL | Freq: Once | OROMUCOSAL | Status: AC

## 2019-10-29 MED ORDER — ONDANSETRON HCL 4 MG/2ML IJ SOLN: 4.0000 mg | Freq: Once | INTRAMUSCULAR | Status: DC | PRN

## 2019-10-29 SURGICAL SUPPLY — 27 items
BAG DRAIN CYSTO-URO LG1000N (MISCELLANEOUS) ×2 IMPLANT
BAG DRN RND TRDRP ANRFLXCHMBR (UROLOGICAL SUPPLIES) ×1
BAG URINE DRAIN 2000ML AR STRL (UROLOGICAL SUPPLIES) ×2 IMPLANT
BRUSH SCRUB EZ  4% CHG (MISCELLANEOUS) ×1
BRUSH SCRUB EZ 4% CHG (MISCELLANEOUS) ×1 IMPLANT
CATH FOLEY 2WAY  5CC 16FR (CATHETERS) ×1
CATH FOLEY 2WAY 5CC 16FR (CATHETERS) ×1
CATH URTH 16FR FL 2W BLN LF (CATHETERS) ×1 IMPLANT
DRAPE UTILITY 15X26 TOWEL STRL (DRAPES) ×2 IMPLANT
DRSG TELFA 4X3 1S NADH ST (GAUZE/BANDAGES/DRESSINGS) ×2 IMPLANT
ELECT LOOP 22F BIPOLAR SML (ELECTROSURGICAL)
ELECT REM PT RETURN 9FT ADLT (ELECTROSURGICAL)
ELECTRODE LOOP 22F BIPOLAR SML (ELECTROSURGICAL) IMPLANT
ELECTRODE REM PT RTRN 9FT ADLT (ELECTROSURGICAL) IMPLANT
GLOVE BIO SURGEON STRL SZ 6.5 (GLOVE) ×2 IMPLANT
GOWN STRL REUS W/ TWL LRG LVL3 (GOWN DISPOSABLE) ×2 IMPLANT
GOWN STRL REUS W/TWL LRG LVL3 (GOWN DISPOSABLE) ×4
KIT TURNOVER CYSTO (KITS) ×2 IMPLANT
LOOP CUT BIPOLAR 24F LRG (ELECTROSURGICAL) IMPLANT
NDL SAFETY ECLIPSE 18X1.5 (NEEDLE) ×1 IMPLANT
NEEDLE HYPO 18GX1.5 SHARP (NEEDLE) ×2
PACK CYSTO AR (MISCELLANEOUS) ×2 IMPLANT
PAD ARMBOARD 7.5X6 YLW CONV (MISCELLANEOUS) ×2 IMPLANT
SET IRRIG Y TYPE TUR BLADDER L (SET/KITS/TRAYS/PACK) ×2 IMPLANT
SURGILUBE 2OZ TUBE FLIPTOP (MISCELLANEOUS) ×2 IMPLANT
SYRINGE IRR TOOMEY STRL 70CC (SYRINGE) ×2 IMPLANT
WATER STERILE IRR 1000ML POUR (IV SOLUTION) ×2 IMPLANT

## 2019-10-29 NOTE — Op Note (Signed)
Date of procedure: 10/29/19  Preoperative diagnosis:  1. Bladder tumor, dome 2. Overactive bladder  Postoperative diagnosis:  1. Same as above  Procedure: 1. TURBT, medium  Surgeon: [name], MD  Anesthesia: General  Complications: None  Intraoperative findings: At least 2 discrete papillary tumors 1 of which had significant adjacent papillary carpeting spread measuring up to 2 cm in diameter.  There is also adjacent erythema highly concerning for concomitant CIS involving the dome as well as right lateral bladder wall.  Very friable bladder mucosa diffusely.  EBL: 50 cc  Specimens: Bladder tumor, dome  Drains: 16 French Foley catheter  Indication: Susan Snyder is a 54 y.o. patient with irritative voiding symptoms as well as microscopic hematuria found to have tumors at the dome of the bladder.  After reviewing the management options for treatment, she elected to proceed with the above surgical procedure(s). We have discussed the potential benefits and risks of the procedure, side effects of the proposed treatment, the likelihood of the patient achieving the goals of the procedure, and any potential problems that might occur during the procedure or recuperation. Informed consent has been obtained.  Description of procedure:  The patient was taken to the operating room and general anesthesia was induced.  The patient was placed in the dorsal lithotomy position, prepped and draped in the usual sterile fashion, and preoperative antibiotics were administered. A preoperative time-out was performed.   A 21 French scope was advanced per urethra into the bladder.  It was carefully inspected.  This revealed at least 2 discrete tumors at the dome of the bladder which were relatively broad based on papillary nature.  The larger tumor at the true dome of the bladder had papillary carpet-like spreading extending from the base of the tumor which cover approximately 2 cm area which extended  towards the right lateral bladder wall.  There is also surrounding areas of patchy velvety erythema which are highly concerning for possible concomitant CIS.  The UOs were identified and noted to be free of any tumor without any adjacent lesions.  At this point time, upon the cold cup biopsy forceps in order to attempt to resect the tumor and a piece wise fashion.  Unfortunately, with decompression of the bladder, it began to weep diffusely from the tumor as well as the erythematous areas which made visualization very poor.  I was able to get 1 single biopsy of the smaller tumor using the cold cups in this manner but then bleeding became suboptimal.  I exchanged a 21 French cystoscope for a 26 [place] resectoscope.  Using continuous flow, I used the bipolar loop to start to resect the dome tumor.  This was somewhat difficult given the location as well as her breathing pattern.  Gentle compression of the dome of the bladder from the anterior abdominal wall was used to help bring the dome of the bladder down to the bipolar loop.  Ultimately, I was able to resect large portion of the bladder tumor in this manner.  I fulgurated the base and hemostasis became more reasonable at this point.  I brought back in the cold cup biopsy forceps using the bipolar resectoscope continuous flow sheath and was able to resect the remainder of the tumor and a piece wise fashion in order to help avoid cautery artifact.  Some of the biopsy bites were deeper than anticipated a small amount of fat was identified.  Her bili remained nice and soft and without concern for any gross perforation.  I also attempted to biopsy some of the erythematous patches adjacent to the tumor which was passed off the field altogether 1 specimen.  The bipolar loop was then used to extensively fulgurate the tumor bed as well as adjacent erythematous areas.  Finally, hemostasis was excellent.  All the remaining bladder tumor chips were evacuated from the  bladder.  Elected not to place intravesical mitomycin given the depth of resection.  Again, given the location of the tumor, difficulty of this case and deeper resection, I elected to place a catheter as a precaution which will leave for 3 days and remove with a cystogram prior.  27 French Foley catheter was placed and the balloon was filled with 10 cc of sterile water.  The patient's then cleaned and dried, repositioned supine position, reversed myesthesia, and taken the PACU in stable condition  Plan: We will have her return later this week for pathology review as well as cystogram and Foley removal.  I am concerned this Pineda represent high-grade tumor possibly with CIS.  She would likely need additional treatment and/or resection.  [name], M.D.

## 2019-10-29 NOTE — Telephone Encounter (Signed)
This patient underwent a TURBT today.  I am sending her home with a Foley catheter out of upmost precaution.  I had like her to have a cystogram on Thursday and appointment for catheter removal shortly thereafter.  This can be with Larene Beach or Sam.  Order placed.    [name], MD

## 2019-10-29 NOTE — Transfer of Care (Signed)
Immediate Anesthesia Transfer of Care Note  Patient: Susan Snyder  Procedure(s) Performed: TRANSURETHRAL RESECTION OF BLADDER TUMOR (N/A Bladder)  Patient Location: PACU  Anesthesia Type:General  Level of Consciousness: drowsy  Airway & Oxygen Therapy: Patient Spontanous Breathing and Patient connected to face mask oxygen  Post-op Assessment: Report given to RN and Post -op Vital signs reviewed and stable  Post vital signs: Reviewed and stable  Last Vitals:  Vitals Value Taken Time  BP 131/77 10/29/19 1054  Temp    Pulse 82 10/29/19 1054  Resp 16 10/29/19 1054  SpO2 99 % 10/29/19 1054  Vitals shown include unvalidated device data.  Last Pain:  Vitals:   10/29/19 0807  TempSrc: Tympanic  PainSc: 0-No pain      Patients Stated Pain Goal: 0 (29/24/46 2863)  Complications: No complications documented.

## 2019-10-29 NOTE — Anesthesia Procedure Notes (Signed)
Procedure Name: LMA Insertion Date/Time: 10/29/2019 9:51 AM Performed by: Eben Burow, CRNA Pre-anesthesia Checklist: Patient identified, Emergency Drugs available, Suction available and Patient being monitored Patient Re-evaluated:Patient Re-evaluated prior to induction Oxygen Delivery Method: Circle system utilized Preoxygenation: Pre-oxygenation with 100% oxygen Induction Type: IV induction LMA: LMA inserted LMA Size: 4.0 Number of attempts: 1 Placement Confirmation: positive ETCO2 and breath sounds checked- equal and bilateral Tube secured with: Tape Dental Injury: Teeth and Oropharynx as per pre-operative assessment

## 2019-10-29 NOTE — Anesthesia Preprocedure Evaluation (Signed)
Anesthesia Evaluation  Patient identified by MRN, date of birth, ID band Patient awake    Reviewed: Allergy & Precautions, NPO status , Patient's Chart, lab work & pertinent test results  History of Anesthesia Complications (+) PONV and history of anesthetic complications  Airway Mallampati: II  TM Distance: >3 FB Neck ROM: Full    Dental no notable dental hx.    Pulmonary neg sleep apnea, neg COPD, former smoker,    breath sounds clear to auscultation- rhonchi (-) wheezing      Cardiovascular Exercise Tolerance: Good hypertension, Pt. on medications (-) CAD, (-) Past MI, (-) Cardiac Stents and (-) CABG  Rhythm:Regular Rate:Normal - Systolic murmurs and - Diastolic murmurs    Neuro/Psych  Headaches, neg Seizures PSYCHIATRIC DISORDERS Anxiety Depression    GI/Hepatic Neg liver ROS, GERD  ,  Endo/Other  negative endocrine ROSneg diabetes  Renal/GU Renal disease: hx of nephrolithiasis.     Musculoskeletal  (+) Arthritis ,   Abdominal (+) + obese,   Peds  Hematology  (+) anemia ,   Anesthesia Other Findings Past Medical History: No date: Anemia No date: Cancer ([place]) 04/03/2019: CCC (chronic calculous cholecystitis) No date: Chronic diarrhea Sept. 2015: Chronic gastritis     Comment:  on EGD  No date: Collagenous colitis     Comment:  colonoscopy biopsies 8/34/37 No date: Complication of anesthesia No date: DDD (degenerative disc disease), cervical No date: GERD (gastroesophageal reflux disease) No date: History of kidney stones No date: Hyperplastic polyps of stomach     Comment:  Aug 2015 No date: Hypertension 05/20/2016: Neoplasm of uncertain behavior of skin     Comment:  Hx of skin cancer, BCC No date: PONV (postoperative nausea and vomiting)     Comment:  only with her hysterectomy   Reproductive/Obstetrics                             Anesthesia Physical Anesthesia Plan  ASA:  II  Anesthesia Plan: General   Post-op Pain Management:    Induction: Intravenous  PONV Risk Score and Plan: 3 and Ondansetron, Dexamethasone and Midazolam  Airway Management Planned: LMA  Additional Equipment:   Intra-op Plan:   Post-operative Plan:   Informed Consent: I have reviewed the patients History and Physical, chart, labs and discussed the procedure including the risks, benefits and alternatives for the proposed anesthesia with the patient or authorized representative who has indicated his/her understanding and acceptance.     Dental advisory given  Plan Discussed with: CRNA and Anesthesiologist  Anesthesia Plan Comments:         Anesthesia Quick Evaluation

## 2019-10-29 NOTE — Anesthesia Postprocedure Evaluation (Signed)
Anesthesia Post Note  Patient: Akaiya Touchette Rasmus  Procedure(s) Performed: TRANSURETHRAL RESECTION OF BLADDER TUMOR (N/A Bladder)  Patient location during evaluation: PACU Anesthesia Type: General Level of consciousness: awake and alert and oriented Pain management: pain level controlled Vital Signs Assessment: post-procedure vital signs reviewed and stable Respiratory status: spontaneous breathing, nonlabored ventilation and respiratory function stable Cardiovascular status: blood pressure returned to baseline and stable Postop Assessment: no signs of nausea or vomiting Anesthetic complications: no   No complications documented.   Last Vitals:  Vitals:   10/29/19 1126 10/29/19 1150  BP: 134/76   Pulse: 64   Resp: (!) 22   Temp:  (!) 36.1 C  SpO2: 100%     Last Pain:  Vitals:   10/29/19 1150  TempSrc:   PainSc: 0-No pain                 Jones Viviani

## 2019-10-29 NOTE — Interval H&P Note (Signed)
History and Physical Interval Note:  10/29/2019 9:29 AM  Susan Snyder  has presented today for surgery, with the diagnosis of bladder neoplasm of uncertain malignant potential.  The various methods of treatment have been discussed with the patient and family. After consideration of risks, benefits and other options for treatment, the patient has consented to  Procedure(s): TRANSURETHRAL RESECTION OF BLADDER TUMOR WITH Gemcitabine (N/A) as a surgical intervention.  The patient's history has been reviewed, patient examined, no change in status, stable for surgery.  I have reviewed the patient's chart and labs.  Questions were answered to the patient's satisfaction.    RRR CTAB  Taking macrobid for lactobacillus  [name]

## 2019-10-29 NOTE — Telephone Encounter (Signed)
App made with Larene Beach and Simar to call patient to schedule her cystogram Patient is aware  Sharyn Lull

## 2019-10-29 NOTE — Discharge Instructions (Addendum)
Transurethral Resection of Bladder Tumor (TURBT) or Bladder Biopsy   Definition:  Transurethral Resection of the Bladder Tumor is a surgical procedure used to diagnose and remove tumors within the bladder. TURBT is the most common treatment for early stage bladder cancer.  General instructions:     Your recent bladder surgery requires very little post hospital care but some definite precautions.  Despite the fact that no skin incisions were used, the area around the bladder incisions are raw and covered with scabs to promote healing and prevent bleeding. Certain precautions are needed to insure that the scabs are not disturbed over the next 2-4 weeks while the healing proceeds.  Because the raw surface inside your bladder and the irritating effects of urine you [place] expect frequency of urination and/or urgency (a stronger desire to urinate) and perhaps even getting up at night more often. This will usually resolve or improve slowly over the healing period. You Odenthal see some blood in your urine over the first 6 weeks. Do not be alarmed, even if the urine was clear for a while. Get off your feet and drink lots of fluids until clearing occurs. If you start to pass clots or don't improve call us.  Diet:  You Coats return to your normal diet immediately. Because of the raw surface of your bladder, alcohol, spicy foods, foods high in acid and drinks with caffeine Rockers cause irritation or frequency and should be used in moderation. To keep your urine flowing freely and avoid constipation, drink plenty of fluids during the day (8-10 glasses). Tip: Avoid cranberry juice because it is very acidic.  Activity:  Your physical activity doesn't need to be restricted. However, if you are very active, you Bress see some blood in the urine. We suggest that you reduce your activity under the circumstances until the bleeding has stopped.  Bowels:  It is important to keep your bowels regular during the postoperative  period. Straining with bowel movements can cause bleeding. A bowel movement every other day is reasonable. Use a mild laxative if needed, such as milk of magnesia 2-3 tablespoons, or 2 Dulcolax tablets. Call if you continue to have problems. If you had been taking narcotics for pain, before, during or after your surgery, you Sweda be constipated. Take a laxative if necessary.    Medication:  You should resume your pre-surgery medications unless told not to. In addition you Covello be given an antibiotic to prevent or treat infection. Antibiotics are not always necessary. All medication should be taken as prescribed until the bottles are finished unless you are having an unusual reaction to one of the drugs.   [place], [place] [address] [phone]       Indwelling Urinary Catheter Care, Adult An indwelling urinary catheter is a thin tube that is put into your bladder. The tube helps to drain pee (urine) out of your body. The tube goes in through your urethra. Your urethra is where pee comes out of your body. Your pee will come out through the catheter, then it will go into a bag (drainage bag). Take good care of your catheter so it will work well. How to wear your catheter and bag Supplies needed  Sticky tape (adhesive tape) or a leg strap.  Alcohol wipe or soap and water (if you use tape).  A clean towel (if you use tape).  Large overnight bag.  Smaller bag (leg bag). Wearing your catheter Attach your catheter to your leg with tape  or a leg strap.  Make sure the catheter is not pulled tight.  If a leg strap gets wet, take it off and put on a dry strap.  If you use tape to hold the bag on your leg: 1. Use an alcohol wipe or soap and water to wash your skin where the tape made it sticky before. 2. Use a clean towel to pat-dry that skin. 3. Use new tape to make the bag stay on your leg. Wearing your bags You should have been given a large overnight  bag.  You Stanco wear the overnight bag in the day or night.  Always have the overnight bag lower than your bladder.  Do not let the bag touch the floor.  Before you go to sleep, put a clean plastic bag in a wastebasket. Then hang the overnight bag inside the wastebasket. You should also have a smaller leg bag that fits under your clothes.  Always wear the leg bag below your knee.  Do not wear your leg bag at night. How to care for your skin and catheter Supplies needed  A clean washcloth.  Water and mild soap.  A clean towel. Caring for your skin and catheter      Clean the skin around your catheter every day: 1. Wash your hands with soap and water. 2. Wet a clean washcloth in warm water and mild soap. 3. Clean the skin around your urethra.  If you are female:  Gently spread the folds of skin around your vagina (labia).  With the washcloth in your other hand, wipe the inner side of your labia on each side. Wipe from front to back.  If you are female:  Pull back any skin that covers the end of your penis (foreskin).  With the washcloth in your other hand, wipe your penis in small circles. Start wiping at the tip of your penis, then move away from the catheter.  Move the foreskin back in place, if needed. 4. With your free hand, hold the catheter close to where it goes into your body.  Keep holding the catheter during cleaning so it does not get pulled out. 5. With the washcloth in your other hand, clean the catheter.  Only wipe downward on the catheter.  Do not wipe upward toward your body. Doing this Koy push germs into your urethra and cause infection. 6. Use a clean towel to pat-dry the catheter and the skin around it. Make sure to wipe off all soap. 7. Wash your hands with soap and water.  Shower every day. Do not take baths.  Do not use cream, ointment, or lotion on the area where the catheter goes into your body, unless your doctor tells you to.  Do not use  powders, sprays, or lotions on your genital area.  Check your skin around the catheter every day for signs of infection. Check for: ? Redness, swelling, or pain. ? Fluid or blood. ? Warmth. ? Pus or a bad smell. How to empty the bag Supplies needed  Rubbing alcohol.  Gauze pad or cotton ball.  Tape or a leg strap. Emptying the bag Pour the pee out of your bag when it is ?- full, or at least 2-3 times a day. Do this for your overnight bag and your leg bag. 1. Wash your hands with soap and water. 2. Separate (detach) the bag from your leg. 3. Hold the bag over the toilet or a clean pail. Keep the bag lower than  your hips and bladder. This is so the pee (urine) does not go back into the tube. 4. Open the pour spout. It is at the bottom of the bag. 5. Empty the pee into the toilet or pail. Do not let the pour spout touch any surface. 6. Put rubbing alcohol on a gauze pad or cotton ball. 7. Use the gauze pad or cotton ball to clean the pour spout. 8. Close the pour spout. 9. Attach the bag to your leg with tape or a leg strap. 10. Wash your hands with soap and water. Follow instructions for cleaning the drainage bag:  From the product maker.  As told by your doctor. How to change the bag Supplies needed  Alcohol wipes.  A clean bag.  Tape or a leg strap. Changing the bag Replace your bag when it starts to leak, smell bad, or look dirty. 1. Wash your hands with soap and water. 2. Separate the dirty bag from your leg. 3. Pinch the catheter with your fingers so that pee does not spill out. 4. Separate the catheter tube from the bag tube where these tubes connect (at the connection valve). Do not let the tubes touch any surface. 5. Clean the end of the catheter tube with an alcohol wipe. Use a different alcohol wipe to clean the end of the bag tube. 6. Connect the catheter tube to the tube of the clean bag. 7. Attach the clean bag to your leg with tape or a leg strap. Do not  make the bag tight on your leg. 8. Wash your hands with soap and water. General rules   Never pull on your catheter. Never try to take it out. Doing that can hurt you.  Always wash your hands before and after you touch your catheter or bag. Use a mild, fragrance-free soap. If you do not have soap and water, use hand sanitizer.  Always make sure there are no twists or bends (kinks) in the catheter tube.  Always make sure there are no leaks in the catheter or bag.  Drink enough fluid to keep your pee pale yellow.  Do not take baths, swim, or use a hot tub.  If you are female, wipe from front to back after you poop (have a bowel movement). Contact a doctor if:  Your pee is cloudy.  Your pee smells worse than usual.  Your catheter gets clogged.  Your catheter leaks.  Your bladder feels full. Get help right away if:  You have redness, swelling, or pain where the catheter goes into your body.  You have fluid, blood, pus, or a bad smell coming from the area where the catheter goes into your body.  Your skin feels warm where the catheter goes into your body.  You have a fever.  You have pain in your: ? Belly (abdomen). ? Legs. ? Lower back. ? Bladder.  You see blood in the catheter.  Your pee is pink or red.  You feel sick to your stomach (nauseous).  You throw up (vomit).  You have chills.  Your pee is not draining into the bag.  Your catheter gets pulled out. Summary  An indwelling urinary catheter is a thin tube that is placed into the bladder to help drain pee (urine) out of the body.  The catheter is placed into the part of the body that drains pee from the bladder (urethra).  Taking good care of your catheter will keep it working properly and help prevent problems.  Always wash your hands before and after touching your catheter or bag.  Never pull on your catheter or try to take it out. This information is not intended to replace advice given to you  by your health care provider. Make sure you discuss any questions you have with your health care provider. Document Revised: 06/30/2018 Document Reviewed: 10/22/2016 Elsevier Patient Education  2020 [place]   1) The drugs that you were given will stay in your system until tomorrow so for the next 24 hours you should not:  A) Drive an automobile B) Make any legal decisions C) Drink any alcoholic beverage   2) You Kulkarni resume regular meals tomorrow.  Today it is better to start with liquids and gradually work up to solid foods.  You Debois eat anything you prefer, but it is better to start with liquids, then soup and crackers, and gradually work up to solid foods.   3) Please notify your doctor immediately if you have any unusual bleeding, trouble breathing, redness and pain at the surgery site, drainage, fever, or pain not relieved by medication.    4) Additional Instructions:        Please contact your physician with any problems or Same Day Surgery at [phone], Monday through Friday 6 am to 4 pm, or [facility] number at [phone].

## 2019-10-30 ENCOUNTER — Other Ambulatory Visit: Payer: Self-pay

## 2019-10-30 ENCOUNTER — Encounter: Payer: Self-pay | Admitting: Urology

## 2019-10-30 ENCOUNTER — Emergency Department
Admission: EM | Admit: 2019-10-30 | Discharge: 2019-10-30 | Disposition: A | Discharge status: Home / Self Care | Payer: 59 | Attending: Emergency Medicine | Admitting: Emergency Medicine

## 2019-10-30 DIAGNOSIS — Z5321 Procedure and treatment not carried out due to patient leaving prior to being seen by health care provider: Secondary | ICD-10-CM | POA: Diagnosis not present

## 2019-10-30 DIAGNOSIS — R339 Retention of urine, unspecified: Secondary | ICD-10-CM | POA: Diagnosis not present

## 2019-10-30 LAB — SURGICAL PATHOLOGY

## 2019-10-30 NOTE — ED Triage Notes (Signed)
Spoke with dr. Archie Balboa after attempting to irrigate catheter, no further orders at this time. Minimal urine noted on bladder scan.    Pt says she just wants to leave and call the clinic in the morning, she is feeling some relief. Discussed with her staying for eval, encouraged to return if she changes her mind at any time. She was ambulatory to POV.

## 2019-10-30 NOTE — ED Triage Notes (Signed)
Pt states she had a tumor on her bladder removed yesterday, she has a foley catheter in place. States that tonight she started urinating around the foley catheter and has had increased pain foley cath insertion site. Last percocet around [phone]

## 2019-10-30 NOTE — ED Notes (Signed)
Spoke to Dr. Archie Balboa, okay to flush catheter out with 60cc fluid within light pressure. Attempted to irrigate out foley, fluid immediately started leaking around catheter insertion site. Attempted to reposition foley, small amount of pale yellow urine return and still leaking.

## 2019-10-31 ENCOUNTER — Ambulatory Visit: Payer: 59 | Admitting: Urology

## 2019-10-31 NOTE — Progress Notes (Addendum)
Catheter Removal  Patient is present today for a catheter removal.  10 ml of water was drained from the balloon. A 16 FR foley cath was removed from the bladder no complications were noted . Patient tolerated well.  Performed by: [name], MD  Follow up/ Additional notes:  Pathology results:   UROTHELIAL CARCINOMA, HIGH-GRADE, EXTENSIVELY INVASIVE INTO THE  LAMINA PROPRIA.  - ADJACENT UROTHELIAL CARCINOMA IN SITU.  - MUSCULARIS PROPRIA NOT IDENTIFIED.   Invasive carcinoma shows variant histology, with approximately 50%  displaying features of conventional urothelial carcinoma, while  approximately 50% shows giant cell features.    Dr. Erlene Quan reviewed her pathology results with her and her daughter, Yetta Flock.  It was explained that this was an aggressive type of bladder cancer, but the staging is not complete as the muscle layer in the bladder was not identified in pathology specimens.  At this time, we will need to pursue upper tract imaging/chest Xray and return to the OR for more samples from the bladder.  Due to the pathology containing giant cell features, she Casco choose to approach her bladder cancer treatment in an aggressive manner with a cystectomy or wait on the CT results and the second pathology to see if she is a candidate for BCG treatments and surveillance.  She Dolinger also consider a referral to a tertiary center to discuss a partial cystectomy pending the CT and second pathology results.    I spent 30 minutes on the day of the encounter to include pre-visit record review, face-to-face time with the patient, and post-visit ordering of tests.

## 2019-10-31 NOTE — H&P (View-Only) (Signed)
Catheter Removal  Patient is present today for a catheter removal.  10 ml of water was drained from the balloon. A 16 FR foley cath was removed from the bladder no complications were noted . Patient tolerated well.  Performed by: [name], MD  Follow up/ Additional notes:  Pathology results:   UROTHELIAL CARCINOMA, HIGH-GRADE, EXTENSIVELY INVASIVE INTO THE  LAMINA PROPRIA.  - ADJACENT UROTHELIAL CARCINOMA IN SITU.  - MUSCULARIS PROPRIA NOT IDENTIFIED.   Invasive carcinoma shows variant histology, with approximately 50%  displaying features of conventional urothelial carcinoma, while  approximately 50% shows giant cell features.    Dr. Erlene Quan reviewed her pathology results with her and her daughter, Yetta Flock.  It was explained that this was an aggressive type of bladder cancer, but the staging is not complete as the muscle layer in the bladder was not identified in pathology specimens.  At this time, we will need to pursue upper tract imaging/chest Xray and return to the OR for more samples from the bladder.  Due to the pathology containing giant cell features, she Heese choose to approach her bladder cancer treatment in an aggressive manner with a cystectomy or wait on the CT results and the second pathology to see if she is a candidate for BCG treatments and surveillance.  She Mccaul also consider a referral to a tertiary center to discuss a partial cystectomy pending the CT and second pathology results.    I spent 30 minutes on the day of the encounter to include pre-visit record review, face-to-face time with the patient, and post-visit ordering of tests.

## 2019-10-31 NOTE — Telephone Encounter (Signed)
Patient called stating that she went to ER last night due to pain and burning with urinary leakage around the catheter. She states that they flushed that cath and told her to follow up with [place] today. Patient is still having some burning and urgency discomfort today. It was explained after surgery and with the cath in place bladder spasms can be expected. She was instructed to utilize her oxybutinin script and [place] try AZO OTC to help with discomfort. She will keep follow up tomorrow for possible removal. She verbalized understanding

## 2019-11-01 ENCOUNTER — Ambulatory Visit
Admission: RE | Admit: 2019-11-01 | Discharge: 2019-11-01 | Disposition: A | Discharge status: Home / Self Care | Payer: 59 | Source: Ambulatory Visit | Attending: Urology | Admitting: Urology

## 2019-11-01 ENCOUNTER — Telehealth: Payer: Self-pay

## 2019-11-01 ENCOUNTER — Other Ambulatory Visit: Payer: Self-pay

## 2019-11-01 ENCOUNTER — Ambulatory Visit (INDEPENDENT_AMBULATORY_CARE_PROVIDER_SITE_OTHER): Payer: 59 | Admitting: Urology

## 2019-11-01 VITALS — BP 139/82 | HR 64 | Wt 219.0 lb

## 2019-11-01 DIAGNOSIS — D414 Neoplasm of uncertain behavior of bladder: Secondary | ICD-10-CM

## 2019-11-01 MED ORDER — OXYCODONE-ACETAMINOPHEN 5-325 MG PO TABS: 1.0000 | ORAL_TABLET | ORAL | 0 refills | Status: DC | PRN

## 2019-11-01 MED ORDER — IOTHALAMATE MEGLUMINE 17.2 % UR SOLN
250.0000 mL | Freq: Once | URETHRAL | Status: AC | PRN
  Administered 2019-11-01: 250 mL

## 2019-11-01 NOTE — Addendum Note (Signed)
Addended by: Zara Council A on: 11/01/2019 10:32 AM   Modules accepted: Orders

## 2019-11-01 NOTE — Telephone Encounter (Signed)
CT Approved PA # [id number]. CT scheduled for Monday august 16 @ 11:15 am. No PO solid foods 4 hours prior to CT. PT and daughter aware of instructions and appointment time.

## 2019-11-03 ENCOUNTER — Ambulatory Visit: Payer: Self-pay | Admitting: Urology

## 2019-11-05 ENCOUNTER — Telehealth: Payer: Self-pay

## 2019-11-05 ENCOUNTER — Other Ambulatory Visit: Payer: Self-pay | Admitting: Radiology

## 2019-11-05 ENCOUNTER — Ambulatory Visit
Admission: RE | Admit: 2019-11-05 | Discharge: 2019-11-05 | Disposition: A | Discharge status: Home / Self Care | Payer: 59 | Source: Ambulatory Visit | Attending: Urology | Admitting: Urology

## 2019-11-05 ENCOUNTER — Other Ambulatory Visit: Payer: Self-pay

## 2019-11-05 DIAGNOSIS — D414 Neoplasm of uncertain behavior of bladder: Secondary | ICD-10-CM | POA: Diagnosis not present

## 2019-11-05 DIAGNOSIS — C679 Malignant neoplasm of bladder, unspecified: Secondary | ICD-10-CM

## 2019-11-05 LAB — POCT I-STAT CREATININE: Creatinine, Ser: 1 mg/dL (ref 0.44–1.00)

## 2019-11-05 MED ORDER — GEMCITABINE CHEMO FOR BLADDER INSTILLATION 2000 MG: 2000.0000 mg | Freq: Once | INTRAVENOUS | Status: DC

## 2019-11-05 MED ORDER — IOHEXOL 300 MG/ML  SOLN
125.0000 mL | Freq: Once | INTRAMUSCULAR | Status: AC | PRN
  Administered 2019-11-05: 125 mL via INTRAVENOUS

## 2019-11-05 NOTE — Telephone Encounter (Signed)
-----   Message from [name], MD sent at 11/05/2019  3:25 PM EDT ----- Doristine Devoid news, the CT scan looks fine.  There is no obviously enlarged lymph nodes or any other signs of metastatic disease.  Chest x-ray is also negative.  This means that this is likely all localized to the bladder.  Plan for repeat resection/biopsy in the operating room as discussed.  [name], MD

## 2019-11-05 NOTE — Telephone Encounter (Signed)
Patient aware of results and verbalized understanding. She wanted to know the next step in proceeding with the next resection.  She does not have any follow ups on file

## 2019-11-06 ENCOUNTER — Ambulatory Visit: Payer: Self-pay | Admitting: Urology

## 2019-11-06 ENCOUNTER — Telehealth: Payer: Self-pay | Admitting: *Deleted

## 2019-11-06 NOTE — Telephone Encounter (Signed)
Patient informed, voiced understanding.

## 2019-11-06 NOTE — Telephone Encounter (Signed)
Please let her know that we are going to biopsy the areas that we previously already resected to make sure that there is no residual cancer.  This is a small amount of tissue.  If she has any additional questions, I can talk to her on the morning of surgery.    [name], MD

## 2019-11-06 NOTE — Telephone Encounter (Addendum)
Patient informed, voiced understanding.   Patient concerned regarding options going forward-how much of the tissue would be involved in the resection? Would this mean all of the tissue involved will be resected out? Also concerned about the possibility of have to lose her bladder?   ----- Message from [name], MD sent at 11/05/2019  3:25 PM EDT ----- Doristine Devoid news, the CT scan looks fine.  There is no obviously enlarged lymph nodes or any other signs of metastatic disease.  Chest x-ray is also negative.  This means that this is likely all localized to the bladder.  Plan for repeat resection/biopsy in the operating room as discussed.  [name], MD

## 2019-11-07 ENCOUNTER — Other Ambulatory Visit: Payer: Self-pay

## 2019-11-07 DIAGNOSIS — C679 Malignant neoplasm of bladder, unspecified: Secondary | ICD-10-CM

## 2019-11-08 ENCOUNTER — Encounter: Payer: Self-pay | Admitting: Urology

## 2019-11-08 NOTE — Pre-Procedure Instructions (Signed)
Chart reviewed. Called patient and reviewed allergies, meds, history., stated no changes. Patient aware to call SDS day before to get arrival time. She has already stopped her aspirin. Instructed her to continue other meds as usual. She was instructed to not eat anything after midnight 11/18/19 and follow ERAS protocol with clear liquids until 2 hours prior to arrival. Patient able to state allowable clear liquids. She had no other questions.

## 2019-11-09 ENCOUNTER — Other Ambulatory Visit: Payer: 59

## 2019-11-09 ENCOUNTER — Other Ambulatory Visit: Payer: Self-pay

## 2019-11-09 DIAGNOSIS — C679 Malignant neoplasm of bladder, unspecified: Secondary | ICD-10-CM

## 2019-11-12 LAB — URINALYSIS, COMPLETE
Bilirubin, UA: NEGATIVE
Glucose, UA: NEGATIVE
Ketones, UA: NEGATIVE
Nitrite, UA: NEGATIVE
Specific Gravity, UA: 1.025 (ref 1.005–1.030)
Urobilinogen, Ur: 0.2 mg/dL (ref 0.2–1.0)
pH, UA: 5 (ref 5.0–7.5)

## 2019-11-12 LAB — MICROSCOPIC EXAMINATION: RBC, Urine: >30 /hpf — AB (ref 0–2)

## 2019-11-13 LAB — CULTURE, URINE COMPREHENSIVE

## 2019-11-15 ENCOUNTER — Other Ambulatory Visit: Payer: Self-pay

## 2019-11-15 ENCOUNTER — Other Ambulatory Visit
Admission: RE | Admit: 2019-11-15 | Discharge: 2019-11-15 | Disposition: A | Discharge status: Home / Self Care | Payer: 59 | Source: Ambulatory Visit | Attending: Urology | Admitting: Urology

## 2019-11-15 DIAGNOSIS — Z01812 Encounter for preprocedural laboratory examination: Secondary | ICD-10-CM | POA: Insufficient documentation

## 2019-11-15 DIAGNOSIS — Z20822 Contact with and (suspected) exposure to covid-19: Secondary | ICD-10-CM | POA: Insufficient documentation

## 2019-11-15 LAB — SARS CORONAVIRUS 2 (TAT 6-24 HRS): SARS Coronavirus 2: NEGATIVE

## 2019-11-19 ENCOUNTER — Other Ambulatory Visit: Payer: Self-pay

## 2019-11-19 ENCOUNTER — Ambulatory Visit: Payer: 59 | Admitting: Certified Registered Nurse Anesthetist

## 2019-11-19 ENCOUNTER — Encounter: Payer: Self-pay | Admitting: Urology

## 2019-11-19 ENCOUNTER — Encounter
Admission: RE | Disposition: A | Discharge status: Home / Self Care | Payer: Self-pay | Source: Home / Self Care | Attending: Urology

## 2019-11-19 ENCOUNTER — Ambulatory Visit
Admission: RE | Admit: 2019-11-19 | Discharge: 2019-11-19 | Disposition: A | Discharge status: Home / Self Care | Payer: 59 | Attending: Urology | Admitting: Urology

## 2019-11-19 DIAGNOSIS — Z87891 Personal history of nicotine dependence: Secondary | ICD-10-CM | POA: Insufficient documentation

## 2019-11-19 DIAGNOSIS — I10 Essential (primary) hypertension: Secondary | ICD-10-CM | POA: Insufficient documentation

## 2019-11-19 DIAGNOSIS — C671 Malignant neoplasm of dome of bladder: Secondary | ICD-10-CM | POA: Insufficient documentation

## 2019-11-19 DIAGNOSIS — C674 Malignant neoplasm of posterior wall of bladder: Secondary | ICD-10-CM | POA: Insufficient documentation

## 2019-11-19 DIAGNOSIS — C679 Malignant neoplasm of bladder, unspecified: Secondary | ICD-10-CM

## 2019-11-19 DIAGNOSIS — Z87440 Personal history of urinary (tract) infections: Secondary | ICD-10-CM | POA: Insufficient documentation

## 2019-11-19 DIAGNOSIS — Z8551 Personal history of malignant neoplasm of bladder: Secondary | ICD-10-CM

## 2019-11-19 DIAGNOSIS — D494 Neoplasm of unspecified behavior of bladder: Secondary | ICD-10-CM

## 2019-11-19 HISTORY — PX: CYSTOSCOPY WITH BIOPSY: SHX5122

## 2019-11-19 HISTORY — PX: TRANSURETHRAL RESECTION OF BLADDER TUMOR WITH MITOMYCIN-C: SHX6459

## 2019-11-19 SURGERY — TRANSURETHRAL RESECTION OF BLADDER TUMOR WITH MITOMYCIN-C
Anesthesia: General

## 2019-11-19 MED ORDER — DEXAMETHASONE SODIUM PHOSPHATE 10 MG/ML IJ SOLN
INTRAMUSCULAR | Status: DC | PRN
  Administered 2019-11-19: 5 mg via INTRAVENOUS

## 2019-11-19 MED ORDER — LACTATED RINGERS IV SOLN: INTRAVENOUS | Status: DC

## 2019-11-19 MED ORDER — DEXMEDETOMIDINE HCL IN NACL 200 MCG/50ML IV SOLN
INTRAVENOUS | Status: DC | PRN
  Administered 2019-11-19: 8 ug via INTRAVENOUS

## 2019-11-19 MED ORDER — FENTANYL CITRATE (PF) 100 MCG/2ML IJ SOLN
INTRAMUSCULAR | Status: AC
  Administered 2019-11-19: 25 ug via INTRAVENOUS
  Filled 2019-11-19: qty 2

## 2019-11-19 MED ORDER — ESMOLOL HCL 100 MG/10ML IV SOLN
INTRAVENOUS | Status: DC | PRN
  Administered 2019-11-19: 30 mg via INTRAVENOUS
  Administered 2019-11-19: 20 mg via INTRAVENOUS
  Administered 2019-11-19: 30 mg via INTRAVENOUS
  Administered 2019-11-19: 20 mg via INTRAVENOUS

## 2019-11-19 MED ORDER — LIDOCAINE HCL (CARDIAC) PF 100 MG/5ML IV SOSY
PREFILLED_SYRINGE | INTRAVENOUS | Status: DC | PRN
  Administered 2019-11-19: 100 mg via INTRAVENOUS

## 2019-11-19 MED ORDER — METOPROLOL TARTRATE 5 MG/5ML IV SOLN
INTRAVENOUS | Status: DC | PRN
  Administered 2019-11-19 (×2): 2.5 mg via INTRAVENOUS

## 2019-11-19 MED ORDER — SUGAMMADEX SODIUM 200 MG/2ML IV SOLN
INTRAVENOUS | Status: DC | PRN
  Administered 2019-11-19: 200 mg via INTRAVENOUS

## 2019-11-19 MED ORDER — ORAL CARE MOUTH RINSE: 15.0000 mL | Freq: Once | OROMUCOSAL | Status: AC

## 2019-11-19 MED ORDER — FENTANYL CITRATE (PF) 100 MCG/2ML IJ SOLN
INTRAMUSCULAR | Status: AC
  Filled 2019-11-19: qty 2

## 2019-11-19 MED ORDER — MIDAZOLAM HCL 2 MG/2ML IJ SOLN
INTRAMUSCULAR | Status: AC
  Filled 2019-11-19: qty 2

## 2019-11-19 MED ORDER — PROPOFOL 10 MG/ML IV BOLUS
INTRAVENOUS | Status: DC | PRN
  Administered 2019-11-19: 170 mg via INTRAVENOUS

## 2019-11-19 MED ORDER — CIPROFLOXACIN IN D5W 400 MG/200ML IV SOLN
INTRAVENOUS | Status: AC
  Filled 2019-11-19: qty 200

## 2019-11-19 MED ORDER — ROCURONIUM BROMIDE 100 MG/10ML IV SOLN
INTRAVENOUS | Status: DC | PRN
  Administered 2019-11-19: 50 mg via INTRAVENOUS

## 2019-11-19 MED ORDER — CHLORHEXIDINE GLUCONATE 0.12 % MT SOLN
OROMUCOSAL | Status: AC
  Administered 2019-11-19: 15 mL via OROMUCOSAL
  Filled 2019-11-19: qty 15

## 2019-11-19 MED ORDER — OXYCODONE-ACETAMINOPHEN 5-325 MG PO TABS: 1.0000 | ORAL_TABLET | ORAL | 0 refills | Status: DC | PRN

## 2019-11-19 MED ORDER — FAMOTIDINE 20 MG PO TABS
ORAL_TABLET | ORAL | Status: AC
  Administered 2019-11-19: 20 mg via ORAL
  Filled 2019-11-19: qty 1

## 2019-11-19 MED ORDER — FENTANYL CITRATE (PF) 100 MCG/2ML IJ SOLN
INTRAMUSCULAR | Status: DC | PRN
Start: 2019-11-19 — End: 2019-11-19
  Administered 2019-11-19: 100 ug via INTRAVENOUS
  Administered 2019-11-19: 50 ug via INTRAVENOUS

## 2019-11-19 MED ORDER — MIDAZOLAM HCL 2 MG/2ML IJ SOLN
INTRAMUSCULAR | Status: DC | PRN
  Administered 2019-11-19: 2 mg via INTRAVENOUS

## 2019-11-19 MED ORDER — PROPOFOL 10 MG/ML IV BOLUS
INTRAVENOUS | Status: AC
  Filled 2019-11-19: qty 40

## 2019-11-19 MED ORDER — CIPROFLOXACIN IN D5W 400 MG/200ML IV SOLN
400.0000 mg | INTRAVENOUS | Status: AC
  Administered 2019-11-19: 400 mg via INTRAVENOUS

## 2019-11-19 MED ORDER — FAMOTIDINE 20 MG PO TABS: 20.0000 mg | ORAL_TABLET | Freq: Once | ORAL | Status: AC

## 2019-11-19 MED ORDER — ONDANSETRON HCL 4 MG/2ML IJ SOLN: 4.0000 mg | Freq: Once | INTRAMUSCULAR | Status: DC | PRN

## 2019-11-19 MED ORDER — CHLORHEXIDINE GLUCONATE 0.12 % MT SOLN: 15.0000 mL | Freq: Once | OROMUCOSAL | Status: AC

## 2019-11-19 MED ORDER — ONDANSETRON HCL 4 MG/2ML IJ SOLN
INTRAMUSCULAR | Status: DC | PRN
  Administered 2019-11-19: 4 mg via INTRAVENOUS

## 2019-11-19 MED ORDER — FENTANYL CITRATE (PF) 100 MCG/2ML IJ SOLN
25.0000 ug | INTRAMUSCULAR | Status: AC | PRN
  Administered 2019-11-19 (×6): 25 ug via INTRAVENOUS

## 2019-11-19 MED ORDER — OXYBUTYNIN CHLORIDE ER 10 MG PO TB24: 10.0000 mg | ORAL_TABLET | Freq: Every day | ORAL | 2 refills | Status: DC

## 2019-11-19 SURGICAL SUPPLY — 30 items
BAG DRAIN CYSTO-URO LG1000N (MISCELLANEOUS) ×2 IMPLANT
BAG DRN RND TRDRP ANRFLXCHMBR (UROLOGICAL SUPPLIES) ×1
BAG URINE DRAIN 2000ML AR STRL (UROLOGICAL SUPPLIES) ×2 IMPLANT
BRUSH SCRUB EZ  4% CHG (MISCELLANEOUS) ×1
BRUSH SCRUB EZ 4% CHG (MISCELLANEOUS) ×1 IMPLANT
CATH FOLEY 2WAY  5CC 16FR (CATHETERS) ×1
CATH FOLEY 2WAY 5CC 16FR (CATHETERS) ×1
CATH URTH 16FR FL 2W BLN LF (CATHETERS) ×1 IMPLANT
DRAPE UTILITY 15X26 TOWEL STRL (DRAPES) ×2 IMPLANT
DRSG TELFA 4X3 1S NADH ST (GAUZE/BANDAGES/DRESSINGS) ×2 IMPLANT
ELECT LOOP 22F BIPOLAR SML (ELECTROSURGICAL) ×2
ELECT REM PT RETURN 9FT ADLT (ELECTROSURGICAL)
ELECTRODE LOOP 22F BIPOLAR SML (ELECTROSURGICAL) ×1 IMPLANT
ELECTRODE REM PT RTRN 9FT ADLT (ELECTROSURGICAL) IMPLANT
GLOVE BIO SURGEON STRL SZ 6.5 (GLOVE) ×2 IMPLANT
GOWN STRL REUS W/ TWL LRG LVL3 (GOWN DISPOSABLE) ×2 IMPLANT
GOWN STRL REUS W/TWL LRG LVL3 (GOWN DISPOSABLE) ×4
IV NS IRRIG 3000ML ARTHROMATIC (IV SOLUTION) ×12 IMPLANT
KIT TURNOVER CYSTO (KITS) ×2 IMPLANT
LOOP CUT BIPOLAR 24F LRG (ELECTROSURGICAL) IMPLANT
NDL SAFETY ECLIPSE 18X1.5 (NEEDLE) IMPLANT
NEEDLE HYPO 18GX1.5 SHARP (NEEDLE)
PACK CYSTO AR (MISCELLANEOUS) ×2 IMPLANT
PAD ARMBOARD 7.5X6 YLW CONV (MISCELLANEOUS) ×2 IMPLANT
SET CYSTO W/LG BORE CLAMP LF (SET/KITS/TRAYS/PACK) ×2 IMPLANT
SET IRRIG Y TYPE TUR BLADDER L (SET/KITS/TRAYS/PACK) ×2 IMPLANT
SURGILUBE 2OZ TUBE FLIPTOP (MISCELLANEOUS) ×2 IMPLANT
SYRINGE IRR TOOMEY STRL 70CC (SYRINGE) ×2 IMPLANT
WATER STERILE IRR 1000ML POUR (IV SOLUTION) ×2 IMPLANT
WATER STERILE IRR 3000ML UROMA (IV SOLUTION) ×2 IMPLANT

## 2019-11-19 NOTE — Anesthesia Postprocedure Evaluation (Signed)
Anesthesia Post Note  Patient: Susan Snyder  Procedure(s) Performed: TRANSURETHRAL RESECTION OF BLADDER TUMOR WITH Gemcitabine (N/A ) CYSTOSCOPY WITH BIOPSY (N/A )  Patient location during evaluation: PACU Anesthesia Type: General Level of consciousness: awake and alert Pain management: pain level controlled Vital Signs Assessment: post-procedure vital signs reviewed and stable Respiratory status: spontaneous breathing and respiratory function stable Cardiovascular status: stable Anesthetic complications: no   No complications documented.   Last Vitals:  Vitals:   11/19/19 1030 11/19/19 1045  BP: 122/71 135/82  Pulse: 79 75  Resp: (!) 23 (!) 21  Temp:    SpO2: 97% 98%    Last Pain:  Vitals:   11/19/19 1045  TempSrc:   PainSc: 8                  [name] K

## 2019-11-19 NOTE — Op Note (Signed)
Date of procedure: 11/19/19  Preoperative diagnosis:  1. History of bladder cancer  Postoperative diagnosis:  1. Same as above 2. Bladder tumor, posterior wall  Procedure: 1. TURBT, medium 2. Instillation of intravesical chemotherapy  Surgeon: [name], MD  Anesthesia: General  Complications: None  Intraoperative findings: Papillary tumor on the posterior bladder wall, approximately 1 cm area of obvious residual tumor with surrounding erythema.  Extensive necrosis at previous resection bed at dome, anterior bladder wall.  Total of at least 3 cm x 3 cm area reresected extending from dome, posterior bladder wall to the right lateral bladder wall.  EBL: Minimal  Specimens: Bladder tumor, posterior wall (cold cup); bladder tumor, dome  Drains: 16 French Foley catheter  Indication: Susan Snyder is a 54 y.o. 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  She returns today for reresection following her initial TURBT.  After reviewing the management options for treatment, she elected to proceed with the above surgical procedure(s). We have discussed the potential benefits and risks of the procedure, side effects of the proposed treatment, the likelihood of the patient achieving the goals of the procedure, and any potential problems that might occur during the procedure or recuperation. Informed consent has been obtained.  Description of procedure:  The patient was taken to the operating room and general anesthesia was induced.  The patient was placed in the dorsal lithotomy position, prepped and draped in the usual sterile fashion, and preoperative antibiotics were administered. A preoperative time-out was performed.   Initially, 21 French cystoscope was advanced into the bladder.  Immediately on the posterior bladder wall, there was a raised area with papillary change measuring approximately 1  cm consistent with residual tumor with surrounding erythema.  On the dome of the bladder, there was extensive necrosis within the previously resected tumor at the dome.  In order to try to avoid any cautery artifact, use cold cup biopsy forceps to resect the posterior bladder wall tumor and a piece wise fashion from the posterior bladder wall.  This was sent as it is individual specimen.  Unfortunately, visualization became very poor thus I switched the scope for 26 French resectoscope and using the bipolar loop, started to brush off some of the necrotic tissue from the previous resection bed at the dome.  Around the edges of the resection bed, there was raised, edematous, erythematous mucosa which was concerning for either reaction versus residual tumor.  This was resected using the loop.  I also took some deeper swipes along the entirety of the base of the previous resection bed.  On multiple occasions, muscularis could easily be seen.  At the deepest resection site, once a prescription away the necrotic tissue, there was even a small amount of fat.  I do not take any additional surface area as the previous resection at the site was already quite deep.  The bladder is irrigated multiple times to clear the bladder tumor chips.  It ended up resecting a total of a 3 cm x 3 cm area which extended from the dome down to the posterior bladder wall connecting the 2 resection sites and then on the right posterior bladder wall as well.  Hemostasis was then achieved using the bipolar loop.  The remainder of the bladder was again carefully inspected and there was no residual active viable tumor remaining.  The bladder was successfully visually cleared of anything concerning for residual tumor.  Hemostasis appeared adequate at this point in time.  A 16 French Foley catheter was placed and the balloon was filled with 10 cc of sterile water.  She was then cleaned and dried, repositioned supine position, versa myesthesia,  taken to PACU in stable condition.  2000 mg of intravesical gemcitabine was instilled into the bladder.  This was allowed to dwell in the PACU for 1 hour.  After 1 hour, the medication was drained and the Foley was removed.  I will have her follow-up in clinic with me on Friday to review final pathology.  [name], M.D.

## 2019-11-19 NOTE — Interval H&P Note (Signed)
History and Physical Interval Note:  11/19/2019 9:03 AM  Susan Snyder  has presented today for surgery, with the diagnosis of bladder neoplasm of uncertain malignant potential.  The various methods of treatment have been discussed with the patient and family. After consideration of risks, benefits and other options for treatment, the patient has consented to  Procedure(s): TRANSURETHRAL RESECTION OF BLADDER TUMOR WITH Gemcitabine (N/A) CYSTOSCOPY WITH BIOPSY (N/A) as a surgical intervention.  The patient's history has been reviewed, patient examined, no change in status, stable for surgery.  I have reviewed the patient's chart and labs.  Questions were answered to the patient's satisfaction.   CTAB RRR    [name]

## 2019-11-19 NOTE — Transfer of Care (Signed)
Immediate Anesthesia Transfer of Care Note  Patient: Susan Snyder  Procedure(s) Performed: TRANSURETHRAL RESECTION OF BLADDER TUMOR WITH Gemcitabine (N/A ) CYSTOSCOPY WITH BIOPSY (N/A )  Patient Location: PACU  Anesthesia Type:General  Level of Consciousness: awake, alert  and oriented  Airway & Oxygen Therapy: Patient Spontanous Breathing  Post-op Assessment: Report given to RN  Post vital signs: Reviewed and stable  Last Vitals:  Vitals Value Taken Time  BP 133/82 11/19/19 1016  Temp    Pulse 86 11/19/19 1019  Resp 14 11/19/19 1019  SpO2 96 % 11/19/19 1019  Vitals shown include unvalidated device data.  Last Pain:  Vitals:   11/19/19 0730  TempSrc: Temporal  PainSc: 4          Complications: No complications documented.

## 2019-11-19 NOTE — Discharge Instructions (Signed)
AMBULATORY SURGERY  DISCHARGE INSTRUCTIONS   1) The drugs that you were given will stay in your system until tomorrow so for the next 24 hours you should not:  A) Drive an automobile B) Make any legal decisions C) Drink any alcoholic beverage   2) You Cope resume regular meals tomorrow.  Today it is better to start with liquids and gradually work up to solid foods.  You Dilks eat anything you prefer, but it is better to start with liquids, then soup and crackers, and gradually work up to solid foods.   3) Please notify your doctor immediately if you have any unusual bleeding, trouble breathing, redness and pain at the surgery site, drainage, fever, or pain not relieved by medication. 4)   5) Your post-operative visit with Dr.                                     is: Date:                        Time:    Please call to schedule your post-operative visit.  6) Additional Instructions:     Transurethral Resection of Bladder Tumor (TURBT) or Bladder Biopsy   Definition:  Transurethral Resection of the Bladder Tumor is a surgical procedure used to diagnose and remove tumors within the bladder. TURBT is the most common treatment for early stage bladder cancer.  General instructions:     Your recent bladder surgery requires very little post hospital care but some definite precautions.  Despite the fact that no skin incisions were used, the area around the bladder incisions are raw and covered with scabs to promote healing and prevent bleeding. Certain precautions are needed to insure that the scabs are not disturbed over the next 2-4 weeks while the healing proceeds.  Because the raw surface inside your bladder and the irritating effects of urine you Rogowski expect frequency of urination and/or urgency (a stronger desire to urinate) and perhaps even getting up at night more often. This will usually resolve or improve slowly over the healing period. You Camilo see some blood in your urine over  the first 6 weeks. Do not be alarmed, even if the urine was clear for a while. Get off your feet and drink lots of fluids until clearing occurs. If you start to pass clots or don't improve call us.  Diet:  You Messerschmidt return to your normal diet immediately. Because of the raw surface of your bladder, alcohol, spicy foods, foods high in acid and drinks with caffeine [place] cause irritation or frequency and should be used in moderation. To keep your urine flowing freely and avoid constipation, drink plenty of fluids during the day (8-10 glasses). Tip: Avoid cranberry juice because it is very acidic.  Activity:  Your physical activity doesn't need to be restricted. However, if you are very active, you [place] see some blood in the urine. We suggest that you reduce your activity under the circumstances until the bleeding has stopped.  Bowels:  It is important to keep your bowels regular during the postoperative period. Straining with bowel movements can cause bleeding. A bowel movement every other day is reasonable. Use a mild laxative if needed, such as milk of magnesia 2-3 tablespoons, or 2 Dulcolax tablets. Call if you continue to have problems. If you had been taking narcotics for pain, before, during or after  your surgery, you Hickson be constipated. Take a laxative if necessary.    Medication:  You should resume your pre-surgery medications unless told not to. In addition you Gerry be given an antibiotic to prevent or treat infection. Antibiotics are not always necessary. All medication should be taken as prescribed until the bottles are finished unless you are having an unusual reaction to one of the drugs.   [place], [place] [address] [phone]

## 2019-11-19 NOTE — Anesthesia Procedure Notes (Signed)
Procedure Name: Intubation Date/Time: 11/19/2019 9:20 AM Performed by: Louann Sjogren, CRNA Pre-anesthesia Checklist: Patient identified, Patient being monitored, Timeout performed, Emergency Drugs available and Suction available Patient Re-evaluated:Patient Re-evaluated prior to induction Oxygen Delivery Method: Circle system utilized Preoxygenation: Pre-oxygenation with 100% oxygen Induction Type: IV induction Ventilation: Mask ventilation without difficulty Laryngoscope Size: Mac and 4 Grade View: Grade I Tube type: Oral Tube size: 7.0 mm Number of attempts: 1 Airway Equipment and Method: Stylet Placement Confirmation: ETT inserted through vocal cords under direct vision,  positive ETCO2 and breath sounds checked- equal and bilateral Secured at: 21 cm Tube secured with: Tape Dental Injury: Teeth and Oropharynx as per pre-operative assessment

## 2019-11-19 NOTE — Anesthesia Preprocedure Evaluation (Addendum)
Anesthesia Evaluation  Patient identified by MRN, date of birth, ID band Patient awake    Reviewed: Allergy & Precautions, NPO status , Patient's Chart, lab work & pertinent test results  History of Anesthesia Complications (+) PONV and history of anesthetic complications  Airway Mallampati: II       Dental   Pulmonary neg sleep apnea, neg COPD, former smoker,           Cardiovascular hypertension, Pt. on medications and Pt. on home beta blockers (-) Past MI and (-) CHF (-) dysrhythmias (-) Valvular Problems/Murmurs     Neuro/Psych neg Seizures Anxiety Depression    GI/Hepatic Neg liver ROS, GERD  Medicated,  Endo/Other  neg diabetes  Renal/GU Renal disease (recurrent UTIs)     Musculoskeletal   Abdominal   Peds  Hematology  (+) anemia ,   Anesthesia Other Findings   Reproductive/Obstetrics                            Anesthesia Physical Anesthesia Plan  ASA: II  Anesthesia Plan: General   Post-op Pain Management:    Induction: Intravenous  PONV Risk Score and Plan: 4 or greater and Ondansetron, Dexamethasone, Midazolam and Treatment [place] vary due to age or medical condition  Airway Management Planned: Oral ETT  Additional Equipment:   Intra-op Plan:   Post-operative Plan:   Informed Consent: I have reviewed the patients History and Physical, chart, labs and discussed the procedure including the risks, benefits and alternatives for the proposed anesthesia with the patient or authorized representative who has indicated his/her understanding and acceptance.       Plan Discussed with:   Anesthesia Plan Comments:         Anesthesia Quick Evaluation

## 2019-11-20 LAB — SURGICAL PATHOLOGY

## 2019-11-21 ENCOUNTER — Encounter: Payer: Self-pay | Admitting: Family Medicine

## 2019-11-22 NOTE — Progress Notes (Signed)
11/23/2019 8:46 AM   [name] 1965-05-18 [id number]  Referring provider: Delsa Grana, PA-C [address] [place],  [place] [address] Chief Complaint  Patient presents with  . Post-op Follow-up    HPI: Susan Snyder is a 54 y.o. female returns 4 days s/p TURBT and intravesical chemotherapy on 11/19/2019 to discuss induction BCG versus cystectomy. She is accompanied by her daughter today.   TURBT from 10/12/2019 noted a posterior wall tumor, a cluster of dome to anterior tumors (see note for details).  Initial surgical pathology revealed high-grade urothelial carcinoma with variant features, 50% giant cell features invasive into the lamina propria at minimum.  There was concomitant CIS.  Despite surgical resection being relatively deep into the fat, no muscularis propria is identified.  She did undergo a cystogram which was negative and catheter was subsequently removed.  CT hematuria work up on 11/05/2019 noted no findings of bladder leak. Miniscule amount of gas in the urinary bladder, query recent catheterization. No residual urothelial tumor is appreciable. Mildly reduced sensitivity in assessment of the urinary bladder due to reduced signal to noise ratio related to the patient's bilateral hip prostheses (although metal artifact reduction algorithms were employed).  Patient underwent TURBT and intravesical chemotherapy on 11/19/2019. Papillary tumor on the posterior bladder wall, approximately 1 cm area of obvious residual tumor with surrounding erythema.  Extensive necrosis at previous resection bed at dome, anterior bladder wall.  Total of at least 3 cm x 3 cm area reresected extending from dome, posterior bladder wall to the right lateral bladder wall.  Pathology on 11/19/2019 noted high-grade urothelial carcinoma with invasion of lamina propria. Muscularis propria not identified. There was focal residual high-grade urothelial carcinoma with invasion of lamina propria.  Urothelial carcinoma in situ. Muscularis propria was present and no invasion was identified; interpretation is limited due to extensive necrosis.   She is doing well. She reports taking oxybutynin daily. She has not yet seen any improvement with her bladder.   PMH: Past Medical History:  Diagnosis Date  . Anemia   . Cancer ([place])   . CCC (chronic calculous cholecystitis) 04/03/2019  . Chronic diarrhea   . Chronic gastritis Sept. 2015   on EGD   . Collagenous colitis    colonoscopy biopsies 11/01/13  . Complication of anesthesia   . DDD (degenerative disc disease), cervical   . GERD (gastroesophageal reflux disease)   . History of kidney stones   . Hyperplastic polyps of stomach    Aug 2015  . Hypertension   . Neoplasm of uncertain behavior of skin 05/20/2016   Hx of skin cancer, BCC  . PONV (postoperative nausea and vomiting)    only with her hysterectomy    Surgical History: Past Surgical History:  Procedure Laterality Date  . ABDOMINAL HYSTERECTOMY  2005   partial, still has ovaries, due to fibroids.  . APPENDECTOMY    . [place]   x 4  . CHOLECYSTECTOMY N/A 04/11/2019   Procedure: LAPAROSCOPIC CHOLECYSTECTOMY;  Surgeon: Ronny Bacon, MD;  Location: [facility] ORS;  Service: General;  Laterality: N/A;  . COLONOSCOPY W/ BIOPSIES  11/01/13   collagenous colitis, sigmoid polypectomy x 2; hyperplastic polyps  . CYSTOSCOPY WITH BIOPSY N/A 11/19/2019   Procedure: CYSTOSCOPY WITH BIOPSY;  Surgeon: [name], MD;  Location: [facility] OR[facility];  Service: Urology;  Laterality: N/A;  . ESOPHAGOGASTRODUODENOSCOPY  11/22/13   chronic gastritis  . HIP ARTHROPLASTY Bilateral 01/15/14,03/05/14   total hip, Dr.  Nilsa Nutting  . LAPAROSCOPIC APPENDECTOMY N/A 08/20/2018   Procedure: APPENDECTOMY LAPAROSCOPIC;  Surgeon: Fredirick Maudlin, MD;  Location: [facility];  Service: General;  Laterality: N/A;  . NECK SURGERY  2015  . TRANSURETHRAL RESECTION OF BLADDER TUMOR WITH MITOMYCIN-C  N/A 10/29/2019   Procedure: TRANSURETHRAL RESECTION OF BLADDER TUMOR;  Surgeon: [name], MD;  Location: [facility];  Service: Urology;  Laterality: N/A;  . TRANSURETHRAL RESECTION OF BLADDER TUMOR WITH MITOMYCIN-C N/A 11/19/2019   Procedure: TRANSURETHRAL RESECTION OF BLADDER TUMOR WITH Gemcitabine;  Surgeon: [name], MD;  Location: [facility];  Service: Urology;  Laterality: N/A;    Home Medications:  Allergies as of 11/23/2019      Reactions   Adhesive [tape] Other (See Comments)   Blisters   Gabapentin Nausea And Vomiting   Hydrocodone-acetaminophen Nausea And Vomiting   hallucinations   Keflex [cephalexin] Shortness Of Breath, Palpitations   Sulfa Antibiotics Shortness Of Breath, Palpitations   Penicillins Rash   Did it involve swelling of the face/tongue/throat, SOB, or low BP? No Did it involve sudden or severe rash/hives, skin peeling, or any reaction on the inside of your mouth or nose? No Did you need to seek medical attention at a hospital or doctor's office? No When did it last happen?Childhood If all above answers are "NO", [place] proceed with cephalosporin use.      Medication List       Accurate as of November 23, 2019 11:59 PM. If you have any questions, ask your nurse or doctor.        aspirin EC 81 MG tablet Take 1 tablet (81 mg total) by mouth daily.   atorvastatin 10 MG tablet Commonly known as: LIPITOR TAKE 1 TABLET BY MOUTH EVERYDAY AT BEDTIME What changed: See the new instructions.   budesonide 3 MG 24 hr capsule Commonly known as: ENTOCORT EC Take 3 capsules (9 mg total) by mouth daily. What changed:   how much to take  when to take this   buPROPion 150 MG 24 hr tablet Commonly known as: WELLBUTRIN XL Take 150 mg by mouth in the morning and at bedtime.   calcium carbonate 500 MG chewable tablet Commonly known as: TUMS - dosed in mg elemental calcium Chew 1 tablet by mouth as needed for indigestion or heartburn.   metoprolol  succinate 25 MG 24 hr tablet Commonly known as: TOPROL-XL Take 1 tablet (25 mg total) by mouth daily with breakfast. What changed: when to take this   multivitamin with minerals Tabs tablet Take 1 tablet by mouth daily.   oxybutynin 10 MG 24 hr tablet Commonly known as: Ditropan XL Take 1 tablet (10 mg total) by mouth at bedtime.   oxyCODONE-acetaminophen 5-325 MG tablet Commonly known as: Percocet Take 1-2 tablets by mouth every 4 (four) hours as needed for moderate pain or severe pain. What changed: Another medication with the same name was removed. Continue taking this medication, and follow the directions you see here. Changed by: [name], MD   vitamin B-12 1000 MCG tablet Commonly known as: CYANOCOBALAMIN Take 1,000 mcg by mouth daily.   Vitamin D (Cholecalciferol) 25 MCG (1000 UT) Tabs Take 2,000 Units by mouth daily.       Allergies:  Allergies  Allergen Reactions  . Adhesive [Tape] Other (See Comments)    Blisters  . Gabapentin Nausea And Vomiting  . Hydrocodone-Acetaminophen Nausea And Vomiting    hallucinations  . Keflex [Cephalexin] Shortness Of Breath and Palpitations  . Sulfa Antibiotics  Shortness Of Breath and Palpitations  . Penicillins Rash    Did it involve swelling of the face/tongue/throat, SOB, or low BP? No Did it involve sudden or severe rash/hives, skin peeling, or any reaction on the inside of your mouth or nose? No Did you need to seek medical attention at a hospital or doctor's office? No When did it last happen?Childhood If all above answers are "NO", [place] proceed with cephalosporin use.     Family History: Family History  Problem Relation Age of Onset  . Stroke Mother   . Glaucoma Mother   . Heart disease Father   . Cancer Father        prostate  . Heart disease Sister   . Diabetes Sister   . Diabetes Paternal Aunt   . Heart disease Paternal Aunt   . Bleeding Disorder Paternal Uncle   . Heart disease Sister         Psychologist, forensic  . Diabetes Paternal Aunt   . Heart disease Paternal Aunt     Social History:  reports that she quit smoking about 2 months ago. Her smoking use included cigarettes. She has a 30.00 pack-year smoking history. She has never used smokeless tobacco. She reports current alcohol use. She reports that she does not use drugs.   Physical Exam: BP (!) 143/82   Pulse 96   Ht 5' 9"  (1.753 m)   Wt 220 lb (99.8 kg)   BMI 32.49 kg/m   Constitutional:  Alert and oriented, No acute distress. HEENT: [place] AT, moist mucus membranes.  Trachea midline, no masses. Cardiovascular: No clubbing, cyanosis, or edema. Respiratory: Normal respiratory effort, no increased work of breathing. Skin: No rashes, bruises or suspicious lesions. Neurologic: Grossly intact, no focal deficits, moving all 4 extremities. Psychiatric: Normal mood and affect.  Laboratory Data:  Lab Results  Component Value Date   CREATININE 1.00 11/05/2019    Pertinent Imaging:  Results for orders placed during the hospital encounter of 11/05/19  CT HEMATURIA WORKUP  Narrative CLINICAL DATA:  TURBT of bladder transitional cell carcinoma 10/29/2019. Lower abdominal pain with dysuria.  EXAM: CT ABDOMEN AND PELVIS WITHOUT AND WITH CONTRAST  TECHNIQUE: Multidetector CT imaging of the abdomen and pelvis was performed following the standard protocol before and following the bolus administration of intravenous contrast.  CONTRAST:  110m OMNIPAQUE IOHEXOL 300 MG/ML  SOLN  COMPARISON:  Cystogram 11/01/2019 and CT abdomen from 08/20/2018  FINDINGS: Lower chest: Unremarkable  Hepatobiliary: Interval cholecystectomy. Nonspecific 4 mm hypodense lesion in the right hepatic lobe on image 34/4. Nonspecific 3 mm hypodense lesion in the dome of the left hepatic lobe on image 11/4. Stable 4 mm hypodense lesion in segment six of the liver on image 41/4. No specific worrisome liver lesion.  Pancreas: Unremarkable  Spleen:  Unremarkable  Adrenals/Urinary Tract: Stable small peripelvic cyst on the right, image 41/4. Stable exophytic hypodense 8 mm lesion in the right kidney upper pole on image 30/2, probably a cyst but technically too small to characterize.  No abnormal enhancement or abnormal filling defect along the urothelium. Miniscule amount of gas in the urinary bladder. Because of the patient's bilateral hip implants, metal artifact reduction algorithm were employed to improve visualization of the urinary bladder. No findings suggestive of bladder leak.  Stomach/Bowel: Prior appendectomy.  Otherwise unremarkable.  Vascular/Lymphatic: Aortoiliac atherosclerotic vascular disease. No pathologic adenopathy identified.  Reproductive: Prior hysterectomy.  Ovaries unremarkable.  Other: No supplemental non-categorized findings.  Musculoskeletal: Bilateral total hip prostheses. Lumbar spondylosis  and degenerative disc disease including right foraminal impingement at L3-4, L4-5, and L5-S1, as well as a disc bulge and right paracentral disc protrusion at the L2-3 level. No erosive bony findings or significant abnormal fluid collections in the vicinity of the hip implants.  IMPRESSION: 1. No findings of bladder leak. Miniscule amount of gas in the urinary bladder, query recent catheterization. No residual urothelial tumor is appreciable. Mildly reduced sensitivity in assessment of the urinary bladder due to reduced signal to noise ratio related to the patient's bilateral hip prostheses (although metal artifact reduction algorithms were employed). 2. Other imaging findings of potential clinical significance: Lumbar spondylosis and degenerative disc disease causing right foraminal impingement at L3-4, L4-5, and L5-S1. 3. Aortic atherosclerosis.  Aortic Atherosclerosis (ICD10-I70.0).   Electronically Signed By: Van Clines M.D. On: 11/05/2019 14:10   I have personally reviewed the images  and agree with radiologist interpretation.     Assessment & Plan:    1. Bladder cancer High grade T1 CIS with variant cystology.  Restaging TURBT with a small amount of muscle which does not appear to be involved.  Given her age as well as variant histology, one option is being more aggressive and consider cystectomy.  She is strongly considering this as she does not want to deal with recurrences for the rest of her life.  She is open to consultation with Dr. Tresa Moore and is leaning towards this.  We had a long discussion today about the surgery itself, possible complications, and urinary diversion options.  We also discussed bladder sparing options which first and foremost include BCG induction therapy discussed along with most common side effects including frequency, urgency, burning with urination, low incident of flu like symptoms and rare incidents of BCG sepsis.   Patient is considering cystectomy. Will send referral to Dr. Tresa Moore.   She was given samples of Myrbetriq today to help with her bladder instability postop.  If her symptoms fail to continue to improve, she should return and be checked for infection she understands.  [facility] [address] [place], [place] [address] [phone]  I, Selena Batten, am acting as a scribe for Dr. [name].  I have reviewed the above documentation for accuracy and completeness, and I agree with the above.   [name], MD  I spent 40 total minutes on the day of the encounter including pre-visit review of the medical record, face-to-face time with the patient, and post visit ordering of labs/imaging/tests.

## 2019-11-23 ENCOUNTER — Encounter: Payer: Self-pay | Admitting: Urology

## 2019-11-23 ENCOUNTER — Ambulatory Visit (INDEPENDENT_AMBULATORY_CARE_PROVIDER_SITE_OTHER): Payer: 59 | Admitting: Urology

## 2019-11-23 ENCOUNTER — Other Ambulatory Visit: Payer: Self-pay

## 2019-11-23 VITALS — BP 143/82 | HR 96 | Ht 69.0 in | Wt 220.0 lb

## 2019-11-23 DIAGNOSIS — C679 Malignant neoplasm of bladder, unspecified: Secondary | ICD-10-CM

## 2019-11-27 ENCOUNTER — Ambulatory Visit: Payer: 59 | Admitting: Urology

## 2019-11-27 ENCOUNTER — Encounter: Payer: Self-pay | Admitting: Urology

## 2019-12-05 NOTE — Progress Notes (Signed)
Name: Susan Snyder   MRN: [id number]    DOB: 1965/11/24   Date:12/06/2019       Progress Note  Chief Complaint  Patient presents with  . Follow-up  . Hyperlipidemia  . Hypertension     Subjective:   Susan Snyder is a 55 y.o. female, presents to clinic for routine f/up  Bladder cancer working with Dr. Erlene Quan She is consulting with a surgeon for bladder removal due to CA and inability to get clean margins -she has not been working since her procedures, is still having urinary frequency and is on medications per urology, she has been upset and angry at different times in dealing with her diagnosis and trying to make decisions about what to do she does feel that she will go for the more aggressive surgery so that it can be over with and if she has urostomy she leaves feels that she would not have to worry about recurrence   Hypertension:  Currently managed on Metoprolol 79m qd - BP well controlled Pt reports good med compliance and denies any SE.   Blood pressure today is well controlled. BP Readings from Last 3 Encounters:  12/06/19 128/74  11/23/19 (!) 143/82  11/19/19 (!) 147/77   Pt denies CP, SOB, exertional sx, LE edema, palpitation, Ha's, visual disturbances, lightheadedness, hypotension, syncope. Back and hip pain radiates to right thigh    Anxiety and depression -she does admit that she is had some hard days and weeks dealing with her diagnoses and health changes she is on Wellbutrin 150 mg twice daily and she wants to keep the dose the same at this time.  HLD on Lipitor 10 mg-she supposed be taking daily but has decreased this and has had some poor med compliance surrounding her some of her surgeries she has been on and off medicines she would like to defer labs  With procedures it is flared up some pain in her low back and sciatic seems to radiate down her leg   Current Outpatient Medications:  .  aspirin EC 81 MG tablet, Take 1 tablet (81 mg total) by mouth daily.,  Disp: , Rfl:  .  atorvastatin (LIPITOR) 10 MG tablet, TAKE 1 TABLET BY MOUTH EVERYDAY AT BEDTIME (Patient taking differently: Take 10 mg by mouth at bedtime. TAKE 1 TABLET BY MOUTH EVERYDAY AT BEDTIME), Disp: 90 tablet, Rfl: 3 .  budesonide (ENTOCORT EC) 3 MG 24 hr capsule, Take 3 capsules (9 mg total) by mouth daily. (Patient taking differently: Take 3-6 mg by mouth in the morning. ), Disp: 90 capsule, Rfl: 3 .  buPROPion (WELLBUTRIN XL) 150 MG 24 hr tablet, Take 150 mg by mouth in the morning and at bedtime. , Disp: , Rfl:  .  calcium carbonate (TUMS - DOSED IN MG ELEMENTAL CALCIUM) 500 MG chewable tablet, Chew 1 tablet by mouth as needed for indigestion or heartburn., Disp: , Rfl:  .  metoprolol succinate (TOPROL-XL) 25 MG 24 hr tablet, Take 1 tablet (25 mg total) by mouth daily with breakfast. (Patient taking differently: Take 25 mg by mouth at bedtime. ), Disp: 90 tablet, Rfl: 3 .  Multiple Vitamin (MULTIVITAMIN WITH MINERALS) TABS tablet, Take 1 tablet by mouth daily., Disp: , Rfl:  .  oxybutynin (DITROPAN XL) 10 MG 24 hr tablet, Take 1 tablet (10 mg total) by mouth at bedtime., Disp: 30 tablet, Rfl: 2 .  oxyCODONE-acetaminophen (PERCOCET) 5-325 MG tablet, Take 1-2 tablets by mouth every 4 (four) hours as needed  for moderate pain or severe pain., Disp: 10 tablet, Rfl: 0 .  vitamin B-12 (CYANOCOBALAMIN) 1000 MCG tablet, Take 1,000 mcg by mouth daily., Disp: , Rfl:  .  Vitamin D, Cholecalciferol, 25 MCG (1000 UT) TABS, Take 2,000 Units by mouth daily. , Disp: , Rfl:   Current Facility-Administered Medications:  .  gemcitabine (GEMZAR) chemo syringe for bladder instillation 2,000 mg, 2,000 mg, Bladder Instillation, Once, Festus Aloe, MD .  gemcitabine [facility]) chemo syringe for bladder instillation 2,000 mg, 2,000 mg, Bladder Instillation, Once, [name], MD  Patient Active Problem List   Diagnosis Date Noted  . History of recurrent UTIs 08/01/2019  . Major depressive disorder with  current active episode 08/01/2019  . Status post laparoscopic cholecystectomy 04/19/2019  . Status post laparoscopic appendectomy 04/03/2019  . Neoplasm of uncertain behavior of skin 05/20/2016  . 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 Vitamin B12 deficiency 01/14/2015  . Vitamin D deficiency 01/14/2015  . Obesity 01/14/2015  . Hx of iron deficiency anemia 01/14/2015  . Frequent headaches 12/28/2014  . Abnormal finding on MRI of brain 12/28/2014  . Tobacco abuse 12/23/2014  . Stress reaction 12/23/2014  . Hypertension   . Renal cyst   . Collagenous colitis   . H/O bilateral hip replacements 06/28/2014  . Malabsorption due to intolerance, not elsewhere classified 01/02/2014  . Primary localized osteoarthrosis, pelvic region and thigh 10/25/2013  . Lumbar disc herniation with radiculopathy 05/22/2013  . Cervical spondylosis 02/06/2013  . Intervertebral cervical disc disorder with myelopathy, cervical region 02/06/2013    Past Surgical History:  Procedure Laterality Date  . ABDOMINAL HYSTERECTOMY  2005   partial, still has ovaries, due to fibroids.  . APPENDECTOMY    . [place]   x 4  . CHOLECYSTECTOMY N/A 04/11/2019   Procedure: LAPAROSCOPIC CHOLECYSTECTOMY;  Surgeon: Ronny Bacon, MD;  Location: [facility] ORS;  Service: General;  Laterality: N/A;  . COLONOSCOPY W/ BIOPSIES  11/01/13   collagenous colitis, sigmoid polypectomy x 2; hyperplastic polyps  . CYSTOSCOPY WITH BIOPSY N/A 11/19/2019   Procedure: CYSTOSCOPY WITH BIOPSY;  Surgeon: [name], MD;  Location: [facility] ORS;  Service: Urology;  Laterality: N/A;  . ESOPHAGOGASTRODUODENOSCOPY  11/22/13   chronic gastritis  . HIP ARTHROPLASTY Bilateral 01/15/14,03/05/14   total hip, Dr. Nilsa Nutting  . LAPAROSCOPIC APPENDECTOMY N/A 08/20/2018   Procedure: APPENDECTOMY LAPAROSCOPIC;  Surgeon: Fredirick Maudlin, MD;  Location: [facility] ORS;  Service: General;  Laterality: N/A;  . NECK SURGERY  2015  . TRANSURETHRAL RESECTION  OF BLADDER TUMOR WITH MITOMYCIN-C N/A 10/29/2019   Procedure: TRANSURETHRAL RESECTION OF BLADDER TUMOR;  Surgeon: [name], MD;  Location: [facility] ORS;  Service: Urology;  Laterality: N/A;  . TRANSURETHRAL RESECTION OF BLADDER TUMOR WITH MITOMYCIN-C N/A 11/19/2019   Procedure: TRANSURETHRAL RESECTION OF BLADDER TUMOR WITH Gemcitabine;  Surgeon: [name], MD;  Location: [facility] ORS;  Service: Urology;  Laterality: N/A;    Family History  Problem Relation Age of Onset  . Stroke Mother   . Glaucoma Mother   . Heart disease Father   . Cancer Father        prostate  . Heart disease Sister   . Diabetes Sister   . Diabetes Paternal Aunt   . Heart disease Paternal Aunt   . Bleeding Disorder Paternal Uncle   . Heart disease Sister        Psychologist, forensic  . Diabetes Paternal Aunt   . Heart disease Paternal Aunt     Social  History   Tobacco Use  . Smoking status: Former Smoker    Packs/day: 1.00    Years: 30.00    Pack years: 30.00    Types: Cigarettes    Quit date: 09/22/2019    Years since quitting: 0.2  . Smokeless tobacco: Never Used  Vaping Use  . Vaping Use: Every day  . Substances: Nicotine  Substance Use Topics  . Alcohol use: Yes    Comment: rare  . Drug use: No     Allergies  Allergen Reactions  . Adhesive [Tape] Other (See Comments)    Blisters  . Gabapentin Nausea And Vomiting  . Hydrocodone-Acetaminophen Nausea And Vomiting    hallucinations  . Keflex [Cephalexin] Shortness Of Breath and Palpitations  . Sulfa Antibiotics Shortness Of Breath and Palpitations  . Penicillins Rash    Did it involve swelling of the face/tongue/throat, SOB, or low BP? No Did it involve sudden or severe rash/hives, skin peeling, or any reaction on the inside of your mouth or nose? No Did you need to seek medical attention at a hospital or doctor's office? No When did it last happen?Childhood If all above answers are "NO", [place] proceed with cephalosporin use.     Health  Maintenance  Topic Date Due  . MAMMOGRAM  Never done  . COLONOSCOPY  11/02/2018  . INFLUENZA VACCINE  06/19/2020 (Originally 10/21/2019)  . COVID-19 Vaccine (2 - Pfizer 2-dose series) 12/23/2019  . TETANUS/TDAP  05/21/2026  . Hepatitis C Screening  Completed  . HIV Screening  Completed  . PAP SMEAR-Modifier  Discontinued    Chart Review Today: I personally reviewed active problem list, medication list, allergies, family history, social history, health maintenance, notes from last encounter, lab results, imaging with the patient/caregiver today.   Review of Systems  10 Systems reviewed and are negative for acute change except as noted in the HPI.  Objective:   Vitals:   12/06/19 0847  BP: 128/74  Pulse: 77  Resp: 16  Temp: 98.1 F (36.7 C)  TempSrc: Oral  SpO2: 95%  Weight: 226 lb 14.4 oz (102.9 kg)  Height: 5' 9"  (1.753 m)    Body mass index is 33.51 kg/m.  Physical Exam Vitals and nursing note reviewed.  Constitutional:      General: She is not in acute distress.    Appearance: Normal appearance. She is obese. She is not toxic-appearing.  HENT:     Head: Normocephalic and atraumatic.     Right Ear: External ear normal.     Left Ear: External ear normal.  Eyes:     General:        Right eye: No discharge.        Left eye: No discharge.     Conjunctiva/sclera: Conjunctivae normal.  Cardiovascular:     Rate and Rhythm: Normal rate and regular rhythm.     Pulses: Normal pulses.     Heart sounds: Normal heart sounds.  Pulmonary:     Effort: Pulmonary effort is normal.     Breath sounds: Normal breath sounds.  Abdominal:     General: Bowel sounds are normal.     Palpations: Abdomen is soft.  Musculoskeletal:     Right lower leg: No edema.     Left lower leg: No edema.  Skin:    General: Skin is warm and dry.     Coloration: Skin is not jaundiced or pale.  Neurological:     Mental Status: She is alert.  Psychiatric:  Mood and Affect: Mood normal.         Behavior: Behavior normal.         Assessment & Plan:      ICD-10-CM   1. Essential hypertension  I10    We will do labs at her next follow-up appointment her blood pressure is currently well controlled continue meds and diet lifestyle efforts as able  2. Mixed hyperlipidemia  E78.2    recent health issues and multple procedures/surgeries has cause her to miss meds, some weight gain - she will do labs for HLD in a few months  3. Malignant neoplasm of urinary bladder, unspecified site [facility])  C67.9    Recent diagnosis after multiple visits for urinary frequency seeing surgical specialist and urologist in deciding treatment plan  4. Low back pain with sciatica, sciatica laterality unspecified, unspecified back pain laterality, unspecified chronicity  M54.40    Steroid burst and Lyrica if pain is not resolved  5. Major depressive disorder with current active episode, unspecified depression episode severity, unspecified whether recurrent  F32.9    phq2 neg, reviewed, doing well on wellbutrin 150 mg BID  6. Situational stress  F43.9    supportive listening, offered resources, she would like to keep meds the same for now     Return in about 4 months (around 04/06/2020) for routine  (come sooner if needed for anxiety, MSK pain).   Delsa Grana, PA-C 12/06/19 8:51 AM

## 2019-12-06 ENCOUNTER — Encounter: Payer: Self-pay | Admitting: Family Medicine

## 2019-12-06 ENCOUNTER — Ambulatory Visit (INDEPENDENT_AMBULATORY_CARE_PROVIDER_SITE_OTHER): Payer: 59 | Admitting: Family Medicine

## 2019-12-06 ENCOUNTER — Other Ambulatory Visit: Payer: Self-pay

## 2019-12-06 VITALS — BP 128/74 | HR 77 | Temp 98.1°F | Resp 16 | Ht 69.0 in | Wt 226.9 lb

## 2019-12-06 DIAGNOSIS — C679 Malignant neoplasm of bladder, unspecified: Secondary | ICD-10-CM

## 2019-12-06 DIAGNOSIS — E782 Mixed hyperlipidemia: Secondary | ICD-10-CM

## 2019-12-06 DIAGNOSIS — F439 Reaction to severe stress, unspecified: Secondary | ICD-10-CM

## 2019-12-06 DIAGNOSIS — M5440 Lumbago with sciatica, unspecified side: Secondary | ICD-10-CM | POA: Diagnosis not present

## 2019-12-06 DIAGNOSIS — I10 Essential (primary) hypertension: Secondary | ICD-10-CM

## 2019-12-06 DIAGNOSIS — F329 Major depressive disorder, single episode, unspecified: Secondary | ICD-10-CM

## 2019-12-06 MED ORDER — PREDNISONE 20 MG PO TABS: ORAL_TABLET | ORAL | 0 refills | Status: DC

## 2019-12-06 MED ORDER — BUPROPION HCL ER (XL) 150 MG PO TB24: 150.0000 mg | ORAL_TABLET | Freq: Two times a day (BID) | ORAL | 3 refills | Status: DC

## 2019-12-20 DIAGNOSIS — C679 Malignant neoplasm of bladder, unspecified: Secondary | ICD-10-CM | POA: Insufficient documentation

## 2019-12-20 DIAGNOSIS — E782 Mixed hyperlipidemia: Secondary | ICD-10-CM | POA: Insufficient documentation

## 2019-12-20 MED ORDER — PREGABALIN 25 MG PO CAPS: 25.0000 mg | ORAL_CAPSULE | Freq: Two times a day (BID) | ORAL | 2 refills | Status: DC

## 2019-12-25 ENCOUNTER — Encounter: Payer: Self-pay | Admitting: Family Medicine

## 2019-12-25 MED ORDER — PREGABALIN 25 MG PO CAPS: 25.0000 mg | ORAL_CAPSULE | Freq: Two times a day (BID) | ORAL | 2 refills | Status: DC

## 2019-12-25 MED ORDER — BACLOFEN 10 MG PO TABS: 5.0000 mg | ORAL_TABLET | Freq: Three times a day (TID) | ORAL | 2 refills | Status: DC | PRN

## 2019-12-26 ENCOUNTER — Other Ambulatory Visit: Payer: Self-pay | Admitting: Urology

## 2019-12-27 ENCOUNTER — Encounter: Payer: Self-pay | Admitting: Family Medicine

## 2019-12-31 ENCOUNTER — Other Ambulatory Visit: Payer: Self-pay | Admitting: Urology

## 2020-02-08 ENCOUNTER — Encounter (HOSPITAL_COMMUNITY): Payer: 59

## 2020-02-13 ENCOUNTER — Other Ambulatory Visit: Payer: Self-pay | Admitting: Urology

## 2020-02-19 ENCOUNTER — Other Ambulatory Visit: Payer: Self-pay

## 2020-02-19 ENCOUNTER — Inpatient Hospital Stay (HOSPITAL_COMMUNITY)
Admission: RE | Admit: 2020-02-19 | Discharge: 2020-02-26 | DRG: 654 | Disposition: A | Discharge status: Home Health | Payer: 59 | Source: Ambulatory Visit | Attending: Urology | Admitting: Urology

## 2020-02-19 ENCOUNTER — Encounter (HOSPITAL_COMMUNITY): Payer: Self-pay | Admitting: Urology

## 2020-02-19 DIAGNOSIS — C679 Malignant neoplasm of bladder, unspecified: Principal | ICD-10-CM | POA: Diagnosis present

## 2020-02-19 DIAGNOSIS — Z880 Allergy status to penicillin: Secondary | ICD-10-CM | POA: Diagnosis not present

## 2020-02-19 DIAGNOSIS — Z91048 Other nonmedicinal substance allergy status: Secondary | ICD-10-CM | POA: Diagnosis not present

## 2020-02-19 DIAGNOSIS — Z882 Allergy status to sulfonamides status: Secondary | ICD-10-CM

## 2020-02-19 DIAGNOSIS — C672 Malignant neoplasm of lateral wall of bladder: Secondary | ICD-10-CM | POA: Diagnosis present

## 2020-02-19 DIAGNOSIS — E669 Obesity, unspecified: Secondary | ICD-10-CM | POA: Diagnosis present

## 2020-02-19 DIAGNOSIS — Z87891 Personal history of nicotine dependence: Secondary | ICD-10-CM

## 2020-02-19 DIAGNOSIS — Z885 Allergy status to narcotic agent status: Secondary | ICD-10-CM | POA: Diagnosis not present

## 2020-02-19 DIAGNOSIS — Z87442 Personal history of urinary calculi: Secondary | ICD-10-CM

## 2020-02-19 DIAGNOSIS — Z7982 Long term (current) use of aspirin: Secondary | ICD-10-CM

## 2020-02-19 DIAGNOSIS — I10 Essential (primary) hypertension: Secondary | ICD-10-CM | POA: Diagnosis present

## 2020-02-19 DIAGNOSIS — Z8249 Family history of ischemic heart disease and other diseases of the circulatory system: Secondary | ICD-10-CM | POA: Diagnosis not present

## 2020-02-19 DIAGNOSIS — Z85828 Personal history of other malignant neoplasm of skin: Secondary | ICD-10-CM

## 2020-02-19 DIAGNOSIS — Z79899 Other long term (current) drug therapy: Secondary | ICD-10-CM | POA: Diagnosis not present

## 2020-02-19 DIAGNOSIS — K8010 Calculus of gallbladder with chronic cholecystitis without obstruction: Secondary | ICD-10-CM | POA: Diagnosis present

## 2020-02-19 DIAGNOSIS — Z7951 Long term (current) use of inhaled steroids: Secondary | ICD-10-CM

## 2020-02-19 DIAGNOSIS — K2950 Unspecified chronic gastritis without bleeding: Secondary | ICD-10-CM | POA: Diagnosis present

## 2020-02-19 DIAGNOSIS — K219 Gastro-esophageal reflux disease without esophagitis: Secondary | ICD-10-CM | POA: Diagnosis present

## 2020-02-19 DIAGNOSIS — Z6833 Body mass index (BMI) 33.0-33.9, adult: Secondary | ICD-10-CM

## 2020-02-19 DIAGNOSIS — Z9049 Acquired absence of other specified parts of digestive tract: Secondary | ICD-10-CM | POA: Diagnosis not present

## 2020-02-19 DIAGNOSIS — Z981 Arthrodesis status: Secondary | ICD-10-CM | POA: Diagnosis not present

## 2020-02-19 DIAGNOSIS — Z20822 Contact with and (suspected) exposure to covid-19: Secondary | ICD-10-CM | POA: Diagnosis present

## 2020-02-19 DIAGNOSIS — Z90711 Acquired absence of uterus with remaining cervical stump: Secondary | ICD-10-CM

## 2020-02-19 DIAGNOSIS — Z881 Allergy status to other antibiotic agents status: Secondary | ICD-10-CM | POA: Diagnosis not present

## 2020-02-19 LAB — CBC
HCT: 40.4 % (ref 36.0–46.0)
Hemoglobin: 12.8 g/dL (ref 12.0–15.0)
MCH: 29.7 pg (ref 26.0–34.0)
MCHC: 31.7 g/dL (ref 30.0–36.0)
MCV: 93.7 fL (ref 80.0–100.0)
Platelets: 272 10*3/uL (ref 150–400)
RBC: 4.31 MIL/uL (ref 3.87–5.11)
RDW: 13.4 % (ref 11.5–15.5)
WBC: 8.6 10*3/uL (ref 4.0–10.5)
nRBC: 0 % (ref 0.0–0.2)

## 2020-02-19 LAB — COMPREHENSIVE METABOLIC PANEL
ALT: 18 U/L (ref 0–44)
AST: 17 U/L (ref 15–41)
Albumin: 4.3 g/dL (ref 3.5–5.0)
Alkaline Phosphatase: 85 U/L (ref 38–126)
Anion gap: 10 (ref 5–15)
BUN: 17 mg/dL (ref 6–20)
CO2: 25 mmol/L (ref 22–32)
Calcium: 9.1 mg/dL (ref 8.9–10.3)
Chloride: 104 mmol/L (ref 98–111)
Creatinine, Ser: 0.98 mg/dL (ref 0.44–1.00)
GFR, Estimated: >60 mL/min (ref >60)
Glucose, Bld: 99 mg/dL (ref 70–99)
Potassium: 4.1 mmol/L (ref 3.5–5.1)
Sodium: 139 mmol/L (ref 135–145)
Total Bilirubin: 1 mg/dL (ref 0.3–1.2)
Total Protein: 7.2 g/dL (ref 6.5–8.1)

## 2020-02-19 LAB — ABO/RH: ABO/RH(D): A POS

## 2020-02-19 LAB — PREPARE RBC (CROSSMATCH)

## 2020-02-19 LAB — RESP PANEL BY RT-PCR (FLU A&B, COVID) ARPGX2
Influenza A by PCR: NEGATIVE
Influenza B by PCR: NEGATIVE
SARS Coronavirus 2 by RT PCR: NEGATIVE

## 2020-02-19 LAB — SURGICAL PCR SCREEN
MRSA, PCR: NEGATIVE
Staphylococcus aureus: NEGATIVE

## 2020-02-19 MED ORDER — BUPROPION HCL ER (XL) 150 MG PO TB24
150.0000 mg | ORAL_TABLET | Freq: Every day | ORAL | Status: DC
  Administered 2020-02-21 – 2020-02-26 (×6): 150 mg via ORAL
  Filled 2020-02-19 (×6): qty 1

## 2020-02-19 MED ORDER — CLINDAMYCIN PHOSPHATE 900 MG/50ML IV SOLN
900.0000 mg | INTRAVENOUS | Status: AC
  Administered 2020-02-20: 900 mg via INTRAVENOUS
  Filled 2020-02-19 (×2): qty 50

## 2020-02-19 MED ORDER — PEG 3350-KCL-NA BICARB-NACL 420 G PO SOLR
4000.0000 mL | Freq: Once | ORAL | Status: AC
  Administered 2020-02-19: 4000 mL via ORAL

## 2020-02-19 MED ORDER — METOPROLOL SUCCINATE ER 25 MG PO TB24
25.0000 mg | ORAL_TABLET | Freq: Every day | ORAL | Status: DC
  Administered 2020-02-19: 25 mg via ORAL
  Filled 2020-02-19 (×2): qty 1

## 2020-02-19 MED ORDER — ATORVASTATIN CALCIUM 10 MG PO TABS
10.0000 mg | ORAL_TABLET | Freq: Every day | ORAL | Status: DC
  Administered 2020-02-19 – 2020-02-25 (×6): 10 mg via ORAL
  Filled 2020-02-19 (×7): qty 1

## 2020-02-19 MED ORDER — NEOMYCIN SULFATE 500 MG PO TABS
500.0000 mg | ORAL_TABLET | ORAL | Status: AC
  Administered 2020-02-19 – 2020-02-20 (×2): 500 mg via ORAL
  Filled 2020-02-19 (×2): qty 1

## 2020-02-19 MED ORDER — SODIUM CHLORIDE 0.9 % IV SOLN: INTRAVENOUS | Status: DC

## 2020-02-19 MED ORDER — METRONIDAZOLE 500 MG PO TABS
500.0000 mg | ORAL_TABLET | ORAL | Status: AC
  Administered 2020-02-19 – 2020-02-20 (×2): 500 mg via ORAL
  Filled 2020-02-19 (×2): qty 1

## 2020-02-19 MED ORDER — CIPROFLOXACIN IN D5W 400 MG/200ML IV SOLN
400.0000 mg | Freq: Two times a day (BID) | INTRAVENOUS | Status: DC
  Administered 2020-02-19 – 2020-02-20 (×2): 400 mg via INTRAVENOUS
  Filled 2020-02-19 (×3): qty 200

## 2020-02-19 NOTE — H&P (Signed)
Susan Snyder is an 54 y.o. female.    Chief Complaint:   HPI:   1 - High Grade / Aggressive Variant Bladder Cancer - T1G3 posterior and dome tumors by TURBT 09/2019 and 10/2019 found on eval irritative voiding by Dr. Erlene Quan in [place]. CT clinically localized, single ureters bilaterally. 50% giant cell differentiation (very aggressive subtype) and CIS even at re-resection.   PMH sig for obesity, lumbago/back surgery, bilateral total hip, appy, hyst (still has ovaries, open pfannenstiel), Her PCP is Delsa Grana PA with [facility].   Today " Susan Snyder " is seen as pre-op admission for labs, stomal marking, bowel prep prior to curative intent cystctomy and conduit diversion tomorrow. No interval fevers. Most recent UCX non-clonal.    Past Medical History:  Diagnosis Date  . Anemia   . Cancer ([place])   . CCC (chronic calculous cholecystitis) 04/03/2019  . Chronic diarrhea   . Chronic gastritis Sept. 2015   on EGD   . Collagenous colitis    colonoscopy biopsies 11/01/13  . Complication of anesthesia   . DDD (degenerative disc disease), cervical   . GERD (gastroesophageal reflux disease)   . History of kidney stones   . Hyperplastic polyps of stomach    Aug 2015  . Hypertension   . Neoplasm of uncertain behavior of skin 05/20/2016   Hx of skin cancer, BCC  . PONV (postoperative nausea and vomiting)    only with her hysterectomy    Past Surgical History:  Procedure Laterality Date  . ABDOMINAL HYSTERECTOMY  2005   partial, still has ovaries, due to fibroids.  . APPENDECTOMY    . [place]   x 4  . CHOLECYSTECTOMY N/A 04/11/2019   Procedure: LAPAROSCOPIC CHOLECYSTECTOMY;  Surgeon: Ronny Bacon, MD;  Location: [facility] ORS;  Service: General;  Laterality: N/A;  . COLONOSCOPY W/ BIOPSIES  11/01/13   collagenous colitis, sigmoid polypectomy x 2; hyperplastic polyps  . CYSTOSCOPY WITH BIOPSY N/A 11/19/2019   Procedure: CYSTOSCOPY WITH BIOPSY;  Surgeon: [name],  MD;  Location: [facility] ORS;  Service: Urology;  Laterality: N/A;  . ESOPHAGOGASTRODUODENOSCOPY  11/22/13   chronic gastritis  . HIP ARTHROPLASTY Bilateral 01/15/14,03/05/14   total hip, Dr. Nilsa Nutting  . LAPAROSCOPIC APPENDECTOMY N/A 08/20/2018   Procedure: APPENDECTOMY LAPAROSCOPIC;  Surgeon: Fredirick Maudlin, MD;  Location: [facility] ORS;  Service: General;  Laterality: N/A;  . NECK SURGERY  2015  . TRANSURETHRAL RESECTION OF BLADDER TUMOR WITH MITOMYCIN-C N/A 10/29/2019   Procedure: TRANSURETHRAL RESECTION OF BLADDER TUMOR;  Surgeon: [name], MD;  Location: [facility] ORS;  Service: Urology;  Laterality: N/A;  . TRANSURETHRAL RESECTION OF BLADDER TUMOR WITH MITOMYCIN-C N/A 11/19/2019   Procedure: TRANSURETHRAL RESECTION OF BLADDER TUMOR WITH Gemcitabine;  Surgeon: [name], MD;  Location: [facility] ORS;  Service: Urology;  Laterality: N/A;    Family History  Problem Relation Age of Onset  . Stroke Mother   . Glaucoma Mother   . Heart disease Father   . Cancer Father        prostate  . Heart disease Sister   . Diabetes Sister   . Diabetes Paternal Aunt   . Heart disease Paternal Aunt   . Bleeding Disorder Paternal Uncle   . Heart disease Sister        Psychologist, forensic  . Diabetes Paternal Aunt   . Heart disease Paternal Aunt    Social History:  reports that she quit smoking about 4 months ago. Her smoking use  included cigarettes. She has a 30.00 pack-year smoking history. She has never used smokeless tobacco. She reports current alcohol use. She reports that she does not use drugs.  Allergies:  Allergies  Allergen Reactions  . Adhesive [Tape] Other (See Comments)    Blisters  . Gabapentin Nausea And Vomiting  . Hydrocodone-Acetaminophen Nausea And Vomiting    hallucinations  . Keflex [Cephalexin] Shortness Of Breath and Palpitations  . Sulfa Antibiotics Shortness Of Breath and Palpitations  . Penicillins Rash    Did it involve swelling of the face/tongue/throat, SOB, or low BP? No Did  it involve sudden or severe rash/hives, skin peeling, or any reaction on the inside of your mouth or nose? No Did you need to seek medical attention at a hospital or doctor's office? No When did it last happen?Childhood If all above answers are "NO", [place] proceed with cephalosporin use.     Facility-Administered Medications Prior to Admission  Medication Dose Route Frequency Provider Last Rate Last Admin  . gemcitabine (GEMZAR) chemo syringe for bladder instillation 2,000 mg  2,000 mg Bladder Instillation Once Festus Aloe, MD      . gemcitabine [facility]) chemo syringe for bladder instillation 2,000 mg  2,000 mg Bladder Instillation Once [name], MD       Medications Prior to Admission  Medication Sig Dispense Refill  . aspirin EC 81 MG tablet Take 1 tablet (81 mg total) by mouth daily.    Marland Kitchen atorvastatin (LIPITOR) 10 MG tablet TAKE 1 TABLET BY MOUTH EVERYDAY AT BEDTIME (Patient taking differently: Take 10 mg by mouth at bedtime. ) 90 tablet 3  . budesonide (ENTOCORT EC) 3 MG 24 hr capsule Take 3 capsules (9 mg total) by mouth daily. (Patient taking differently: Take 3-9 mg by mouth in the morning. ) 90 capsule 3  . buPROPion (WELLBUTRIN XL) 150 MG 24 hr tablet Take 1 tablet (150 mg total) by mouth in the morning and at bedtime. (Patient taking differently: Take 150 mg by mouth daily. ) 180 tablet 3  . calcium carbonate (TUMS - DOSED IN MG ELEMENTAL CALCIUM) 500 MG chewable tablet Chew 1 tablet by mouth daily as needed for indigestion or heartburn.     . Cyanocobalamin (VITAMIN B-12) 5000 MCG TBDP Take 5,000 mcg by mouth daily.     . metoprolol succinate (TOPROL-XL) 25 MG 24 hr tablet Take 1 tablet (25 mg total) by mouth daily with breakfast. (Patient taking differently: Take 25 mg by mouth at bedtime. ) 90 tablet 3  . Vitamin D, Cholecalciferol, 25 MCG (1000 UT) CAPS Take 1,000 Units by mouth daily.     . baclofen (LIORESAL) 10 MG tablet Take 0.5-1 tablets (5-10 mg total) by  mouth 3 (three) times daily as needed for muscle spasms. (Patient not taking: Reported on 02/15/2020) 30 each 2  . oxybutynin (DITROPAN XL) 10 MG 24 hr tablet Take 1 tablet (10 mg total) by mouth at bedtime. (Patient not taking: Reported on 02/15/2020) 30 tablet 2  . oxyCODONE-acetaminophen (PERCOCET) 5-325 MG tablet Take 1-2 tablets by mouth every 4 (four) hours as needed for moderate pain or severe pain. (Patient not taking: Reported on 02/15/2020) 10 tablet 0  . pregabalin (LYRICA) 25 MG capsule Take 1 capsule (25 mg total) by mouth 2 (two) times daily. (Patient not taking: Reported on 02/15/2020) 120 capsule 2    No results found for this or any previous visit (from the past 48 hour(s)). No results found.  Review of Systems  Constitutional: Negative for  chills and fever.  All other systems reviewed and are negative.   There were no vitals taken for this visit. Physical Exam Vitals reviewed.  Constitutional:      Comments: AOx3. Very pleasant. Daughter at bedside.   HENT:     Head: Normocephalic.     Nose: Nose normal.  Eyes:     Pupils: Pupils are equal, round, and reactive to light.  Cardiovascular:     Rate and Rhythm: Normal rate.  Abdominal:     Comments: Stable truncal obesity.   Genitourinary:    Comments: No CVAT at present.  Musculoskeletal:        General: Normal range of motion.     Cervical back: Normal range of motion.  Skin:    General: Skin is warm.  Neurological:     General: No focal deficit present.     Mental Status: She is alert.  Psychiatric:        Mood and Affect: Mood normal.      Assessment/Plan  Clears, go lytely, PO ABX, NS at 50, stomal marking, CMP, CBC, T+C 2 units.  Risks (including mortality and non-cure), benefits, alternatives, expected peri-op course including approx 7 day hospital stay and home with home health discussed previously and reiterated today.   Alexis Frock, MD 02/19/2020, 4:55 PM

## 2020-02-19 NOTE — Consult Note (Signed)
[place] Nurse ostomy consult note [place] Nurse requested for preoperative stoma site marking by Dr. Tresa Moore.  Discussed surgical procedure and stoma creation with patient.  Explained role of the [place] nurse team..  Answered patient questions. Patient reports that she has had a cervical fusion and that she will not be able to look down to "see" her stoma for self care.  I explain that we will make accommodations to individualize her care, such as introducing mirrors, etc.  She is appreciative of this information.  Examined patient lying and sitting in order to place the marking in a flat plane, away from any creases or abdominal contour issues and within the rectus muscle. There is a deep crease at the umbilicus. The superior aspect of the supraumbilical skin fold is the preferred site. If this is not able to be reached intraoperatively, a second site is provided.  First Choice:   Marked for ileal conduit in the RUQ  5.5 cm to the right of the umbilicus and  6.0 cm above the umbilicus.  Second Choice:   Marked for ileal conduit in the RLQ  6.0 cm to the right of the umbilicus and  3.0 cm below the umbilicus.  Patient's abdomen cleansed with CHG wipes at site markings, allowed to air dry prior to marking.Covered marks with thin film transparent dressing to preserve mark until date of surgery (tomorrow, 02/20/2020).   [place] Nurse team will follow up with patient after surgery for continue ostomy care and teaching.   Thanks, Maudie Flakes, MSN, RN, [place], Arther Abbott  Pager# [phone]

## 2020-02-19 NOTE — Anesthesia Preprocedure Evaluation (Addendum)
Anesthesia Evaluation  Patient identified by MRN, date of birth, ID band Patient awake    Reviewed: Allergy & Precautions, NPO status , Patient's Chart, lab work & pertinent test results  History of Anesthesia Complications (+) PONV and history of anesthetic complications  Airway Mallampati: II  TM Distance: >3 FB Neck ROM: Full    Dental no notable dental hx. (+) Dental Advisory Given, Teeth Intact   Pulmonary neg pulmonary ROS, sleep apnea , neg COPD, former smoker,    Pulmonary exam normal breath sounds clear to auscultation       Cardiovascular hypertension, Pt. on medications and Pt. on home beta blockers (-) Past MI and (-) CHF Normal cardiovascular exam(-) dysrhythmias (-) Valvular Problems/Murmurs Rhythm:Regular Rate:Normal     Neuro/Psych neg Seizures PSYCHIATRIC DISORDERS Anxiety Depression  Neuromuscular disease negative psych ROS   GI/Hepatic Neg liver ROS, GERD  Medicated,  Endo/Other  neg diabetes  Renal/GU Renal disease (recurrent UTIs)  negative genitourinary   Musculoskeletal  (+) Arthritis , Osteoarthritis,    Abdominal   Peds negative pediatric ROS (+)  Hematology  (+) Blood dyscrasia, anemia ,   Anesthesia Other Findings   Reproductive/Obstetrics negative OB ROS                            Anesthesia Physical  Anesthesia Plan  ASA: III  Anesthesia Plan: General   Post-op Pain Management:    Induction: Intravenous  PONV Risk Score and Plan: 4 or greater and Ondansetron, Dexamethasone, Midazolam, Treatment Kham[place] vary due to age or medical condition and Propofol infusion  Airway Management Planned: Oral ETT  Additional Equipment:   Intra-op Plan:   Post-operative Plan: Extubation in OR  Informed Consent: I have reviewed the patients History and Physical, chart, labs and discussed the procedure including the risks, benefits and alternatives for the proposed  anesthesia with the patient or authorized representative who has indicated his/her understanding and acceptance.       Plan Discussed with: Anesthesiologist and CRNA  Anesthesia Plan Comments: (  )       Anesthesia Quick Evaluation

## 2020-02-20 ENCOUNTER — Inpatient Hospital Stay (HOSPITAL_COMMUNITY): Payer: 59 | Admitting: Anesthesiology

## 2020-02-20 ENCOUNTER — Encounter (HOSPITAL_COMMUNITY)
Admission: RE | Disposition: A | Discharge status: Home Health | Payer: Self-pay | Source: Ambulatory Visit | Attending: Urology

## 2020-02-20 DIAGNOSIS — C672 Malignant neoplasm of lateral wall of bladder: Secondary | ICD-10-CM | POA: Diagnosis present

## 2020-02-20 DIAGNOSIS — C679 Malignant neoplasm of bladder, unspecified: Secondary | ICD-10-CM | POA: Diagnosis present

## 2020-02-20 HISTORY — PX: LYMPHADENECTOMY: SHX5960

## 2020-02-20 HISTORY — PX: ROBOT ASSISTED LAPAROSCOPIC COMPLETE CYSTECT ILEAL CONDUIT: SHX5139

## 2020-02-20 HISTORY — PX: CYSTOSCOPY WITH INJECTION: SHX1424

## 2020-02-20 LAB — HEMOGLOBIN AND HEMATOCRIT, BLOOD
HCT: 37.1 % (ref 36.0–46.0)
Hemoglobin: 11.7 g/dL — ABNORMAL LOW (ref 12.0–15.0)

## 2020-02-20 SURGERY — CYSTECTOMY, ROBOT-ASSISTED, WITH ILEAL CONDUIT CREATION
Anesthesia: General | Site: Abdomen

## 2020-02-20 MED ORDER — ORAL CARE MOUTH RINSE: 15.0000 mL | Freq: Once | OROMUCOSAL | Status: AC

## 2020-02-20 MED ORDER — MIDAZOLAM HCL 2 MG/2ML IJ SOLN
INTRAMUSCULAR | Status: AC
  Filled 2020-02-20: qty 2

## 2020-02-20 MED ORDER — ACETAMINOPHEN 500 MG PO TABS
1000.0000 mg | ORAL_TABLET | Freq: Four times a day (QID) | ORAL | Status: AC
  Administered 2020-02-20 – 2020-02-21 (×2): 1000 mg via ORAL
  Filled 2020-02-20 (×4): qty 2

## 2020-02-20 MED ORDER — DEXAMETHASONE SODIUM PHOSPHATE 10 MG/ML IJ SOLN
INTRAMUSCULAR | Status: DC | PRN
  Administered 2020-02-20: 8 mg via INTRAVENOUS

## 2020-02-20 MED ORDER — MIDAZOLAM HCL 2 MG/2ML IJ SOLN
INTRAMUSCULAR | Status: DC | PRN
  Administered 2020-02-20: 2 mg via INTRAVENOUS

## 2020-02-20 MED ORDER — LACTATED RINGERS IR SOLN
Status: DC | PRN
  Administered 2020-02-20: 1000 mL

## 2020-02-20 MED ORDER — LACTATED RINGERS IV SOLN: INTRAVENOUS | Status: DC

## 2020-02-20 MED ORDER — LACTATED RINGERS IV SOLN: INTRAVENOUS | Status: DC | PRN

## 2020-02-20 MED ORDER — DIPHENHYDRAMINE HCL 50 MG/ML IJ SOLN: 12.5000 mg | Freq: Four times a day (QID) | INTRAMUSCULAR | Status: DC | PRN

## 2020-02-20 MED ORDER — ROCURONIUM BROMIDE 10 MG/ML (PF) SYRINGE
PREFILLED_SYRINGE | INTRAVENOUS | Status: AC
  Filled 2020-02-20: qty 10

## 2020-02-20 MED ORDER — CHLORHEXIDINE GLUCONATE CLOTH 2 % EX PADS
6.0000 | MEDICATED_PAD | Freq: Every day | CUTANEOUS | Status: DC
  Administered 2020-02-21 – 2020-02-25 (×5): 6 via TOPICAL

## 2020-02-20 MED ORDER — PROPOFOL 10 MG/ML IV BOLUS
INTRAVENOUS | Status: DC | PRN
  Administered 2020-02-20: 40 mg via INTRAVENOUS
  Administered 2020-02-20: 160 mg via INTRAVENOUS

## 2020-02-20 MED ORDER — CIPROFLOXACIN HCL 0.3 % OP SOLN
1.0000 [drp] | Freq: Three times a day (TID) | OPHTHALMIC | Status: AC
  Administered 2020-02-20 – 2020-02-21 (×3): 1 [drp] via OPHTHALMIC
  Filled 2020-02-20: qty 2.5

## 2020-02-20 MED ORDER — ROCURONIUM BROMIDE 10 MG/ML (PF) SYRINGE
PREFILLED_SYRINGE | INTRAVENOUS | Status: DC | PRN
  Administered 2020-02-20: 60 mg via INTRAVENOUS
  Administered 2020-02-20: 30 mg via INTRAVENOUS
  Administered 2020-02-20: 20 mg via INTRAVENOUS
  Administered 2020-02-20: 10 mg via INTRAVENOUS
  Administered 2020-02-20: 30 mg via INTRAVENOUS
  Administered 2020-02-20: 40 mg via INTRAVENOUS

## 2020-02-20 MED ORDER — STERILE WATER FOR INJECTION IJ SOLN
INTRAMUSCULAR | Status: DC | PRN
  Administered 2020-02-20: 10 mL

## 2020-02-20 MED ORDER — CIPROFLOXACIN IN D5W 200 MG/100ML IV SOLN
200.0000 mg | Freq: Two times a day (BID) | INTRAVENOUS | Status: AC
  Administered 2020-02-20: 200 mg via INTRAVENOUS
  Filled 2020-02-20: qty 100

## 2020-02-20 MED ORDER — SODIUM CHLORIDE (PF) 0.9 % IJ SOLN
INTRAMUSCULAR | Status: DC | PRN
  Administered 2020-02-20: 20 mL via INTRAVENOUS

## 2020-02-20 MED ORDER — SODIUM CHLORIDE (PF) 0.9 % IJ SOLN
INTRAMUSCULAR | Status: AC
  Filled 2020-02-20: qty 20

## 2020-02-20 MED ORDER — DIPHENHYDRAMINE HCL 12.5 MG/5ML PO ELIX: 12.5000 mg | ORAL_SOLUTION | Freq: Four times a day (QID) | ORAL | Status: DC | PRN

## 2020-02-20 MED ORDER — SUGAMMADEX SODIUM 200 MG/2ML IV SOLN
INTRAVENOUS | Status: DC | PRN
  Administered 2020-02-20: 210 mg via INTRAVENOUS

## 2020-02-20 MED ORDER — LIDOCAINE HCL (PF) 2 % IJ SOLN
INTRAMUSCULAR | Status: AC
  Filled 2020-02-20: qty 5

## 2020-02-20 MED ORDER — FENTANYL CITRATE (PF) 250 MCG/5ML IJ SOLN
INTRAMUSCULAR | Status: AC
  Filled 2020-02-20: qty 5

## 2020-02-20 MED ORDER — SUGAMMADEX SODIUM 500 MG/5ML IV SOLN
INTRAVENOUS | Status: AC
  Filled 2020-02-20: qty 5

## 2020-02-20 MED ORDER — STERILE WATER FOR IRRIGATION IR SOLN
Status: DC | PRN
  Administered 2020-02-20: 1000 mL

## 2020-02-20 MED ORDER — POTASSIUM CHLORIDE 2 MEQ/ML IV SOLN
INTRAVENOUS | Status: DC
  Filled 2020-02-20 (×3): qty 1000

## 2020-02-20 MED ORDER — ONDANSETRON HCL 4 MG/2ML IJ SOLN: 4.0000 mg | Freq: Once | INTRAMUSCULAR | Status: DC | PRN

## 2020-02-20 MED ORDER — OXYCODONE HCL 5 MG PO TABS
5.0000 mg | ORAL_TABLET | ORAL | Status: DC | PRN
  Administered 2020-02-21 – 2020-02-26 (×6): 5 mg via ORAL
  Filled 2020-02-20 (×6): qty 1

## 2020-02-20 MED ORDER — KETOROLAC TROMETHAMINE 0.5 % OP SOLN
1.0000 [drp] | Freq: Three times a day (TID) | OPHTHALMIC | Status: AC | PRN
  Administered 2020-02-20 – 2020-02-21 (×3): 1 [drp] via OPHTHALMIC
  Filled 2020-02-20: qty 5

## 2020-02-20 MED ORDER — ONDANSETRON HCL 4 MG/2ML IJ SOLN
INTRAMUSCULAR | Status: AC
  Filled 2020-02-20: qty 2

## 2020-02-20 MED ORDER — FENTANYL CITRATE (PF) 100 MCG/2ML IJ SOLN
INTRAMUSCULAR | Status: DC | PRN
  Administered 2020-02-20: 50 ug via INTRAVENOUS
  Administered 2020-02-20: 100 ug via INTRAVENOUS
  Administered 2020-02-20 (×2): 50 ug via INTRAVENOUS

## 2020-02-20 MED ORDER — OXYCODONE HCL 5 MG/5ML PO SOLN: 5.0000 mg | Freq: Once | ORAL | Status: DC | PRN

## 2020-02-20 MED ORDER — LIDOCAINE 2% (20 MG/ML) 5 ML SYRINGE
INTRAMUSCULAR | Status: DC | PRN
  Administered 2020-02-20: 50 mg via INTRAVENOUS

## 2020-02-20 MED ORDER — MEPERIDINE HCL 50 MG/ML IJ SOLN: 6.2500 mg | INTRAMUSCULAR | Status: DC | PRN

## 2020-02-20 MED ORDER — ONABOTULINUMTOXINA 100 UNITS IJ SOLR
INTRAMUSCULAR | Status: AC
  Filled 2020-02-20: qty 100

## 2020-02-20 MED ORDER — FENTANYL CITRATE (PF) 100 MCG/2ML IJ SOLN
25.0000 ug | INTRAMUSCULAR | Status: DC | PRN
  Administered 2020-02-20: 50 ug via INTRAVENOUS
  Administered 2020-02-20: 25 ug via INTRAVENOUS

## 2020-02-20 MED ORDER — ACETAMINOPHEN 325 MG PO TABS: 325.0000 mg | ORAL_TABLET | ORAL | Status: DC | PRN

## 2020-02-20 MED ORDER — DEXAMETHASONE SODIUM PHOSPHATE 10 MG/ML IJ SOLN
INTRAMUSCULAR | Status: AC
  Filled 2020-02-20: qty 1

## 2020-02-20 MED ORDER — HYDROMORPHONE HCL 2 MG/ML IJ SOLN
INTRAMUSCULAR | Status: AC
  Filled 2020-02-20: qty 1

## 2020-02-20 MED ORDER — HYDROMORPHONE HCL 1 MG/ML IJ SOLN
INTRAMUSCULAR | Status: DC | PRN
Start: 2020-02-20 — End: 2020-02-20
  Administered 2020-02-20 (×3): .2 mg via INTRAVENOUS

## 2020-02-20 MED ORDER — PROPOFOL 10 MG/ML IV BOLUS
INTRAVENOUS | Status: AC
  Filled 2020-02-20: qty 20

## 2020-02-20 MED ORDER — OXYCODONE HCL 5 MG PO TABS: 5.0000 mg | ORAL_TABLET | Freq: Once | ORAL | Status: DC | PRN

## 2020-02-20 MED ORDER — FENTANYL CITRATE (PF) 100 MCG/2ML IJ SOLN
INTRAMUSCULAR | Status: AC
  Filled 2020-02-20: qty 2

## 2020-02-20 MED ORDER — BUPIVACAINE LIPOSOME 1.3 % IJ SUSP
20.0000 mL | INTRAMUSCULAR | Status: AC
  Administered 2020-02-20: 20 mL
  Filled 2020-02-20: qty 20

## 2020-02-20 MED ORDER — CHLORHEXIDINE GLUCONATE 0.12 % MT SOLN
15.0000 mL | Freq: Once | OROMUCOSAL | Status: AC
  Administered 2020-02-20: 15 mL via OROMUCOSAL

## 2020-02-20 MED ORDER — PHENYLEPHRINE 40 MCG/ML (10ML) SYRINGE FOR IV PUSH (FOR BLOOD PRESSURE SUPPORT)
PREFILLED_SYRINGE | INTRAVENOUS | Status: DC | PRN
  Administered 2020-02-20: 120 ug via INTRAVENOUS

## 2020-02-20 MED ORDER — ONDANSETRON HCL 4 MG/2ML IJ SOLN
INTRAMUSCULAR | Status: DC | PRN
  Administered 2020-02-20: 4 mg via INTRAVENOUS

## 2020-02-20 MED ORDER — STERILE WATER FOR INJECTION IJ SOLN
INTRAMUSCULAR | Status: AC
  Filled 2020-02-20: qty 10

## 2020-02-20 MED ORDER — MORPHINE SULFATE (PF) 2 MG/ML IV SOLN
2.0000 mg | INTRAVENOUS | Status: DC | PRN
  Administered 2020-02-20 – 2020-02-22 (×5): 2 mg via INTRAVENOUS
  Filled 2020-02-20 (×5): qty 1

## 2020-02-20 MED ORDER — ONDANSETRON HCL 4 MG/2ML IJ SOLN
4.0000 mg | INTRAMUSCULAR | Status: DC | PRN
  Administered 2020-02-20: 4 mg via INTRAVENOUS
  Filled 2020-02-20: qty 2

## 2020-02-20 MED ORDER — WATER FOR IRRIGATION, STERILE IR SOLN
Status: DC | PRN
  Administered 2020-02-20: 3000 mL via INTRAVESICAL

## 2020-02-20 MED ORDER — ACETAMINOPHEN 160 MG/5ML PO SOLN: 325.0000 mg | ORAL | Status: DC | PRN

## 2020-02-20 SURGICAL SUPPLY — 99 items
APPLICATOR COTTON TIP 6 STRL (MISCELLANEOUS) ×3 IMPLANT
APPLICATOR COTTON TIP 6IN STRL (MISCELLANEOUS) ×4
APPLICATOR SURGIFLO ENDO (HEMOSTASIS) IMPLANT
BAG LAPAROSCOPIC 12 15 PORT 16 (BASKET) ×3 IMPLANT
BAG RETRIEVAL 12/15 (BASKET) ×4
BAG URO CATCHER STRL LF (MISCELLANEOUS) ×4 IMPLANT
BENZOIN TINCTURE PRP APPL 2/3 (GAUZE/BANDAGES/DRESSINGS) IMPLANT
BLADE SURG SZ10 CARB STEEL (BLADE) IMPLANT
CATH SILICONE 5CC 18FR (INSTRUMENTS) ×4 IMPLANT
CELLS DAT CNTRL 66122 CELL SVR (MISCELLANEOUS) ×3 IMPLANT
CHLORAPREP W/TINT 26 (MISCELLANEOUS) ×4 IMPLANT
CLIP VESOLOCK LG 6/CT PURPLE (CLIP) ×16 IMPLANT
CLIP VESOLOCK MED LG 6/CT (CLIP) ×8 IMPLANT
CLIP VESOLOCK XL 6/CT (CLIP) ×16 IMPLANT
CLOTH BEACON ORANGE TIMEOUT ST (SAFETY) ×4 IMPLANT
CNTNR URN SCR LID CUP LEK RST (MISCELLANEOUS) ×3 IMPLANT
CONT SPEC 4OZ STRL OR WHT (MISCELLANEOUS) ×1
COVER SURGICAL LIGHT HANDLE (MISCELLANEOUS) ×4 IMPLANT
COVER TIP SHEARS 8 DVNC (MISCELLANEOUS) ×3 IMPLANT
COVER TIP SHEARS 8MM DA VINCI (MISCELLANEOUS) ×1
COVER WAND RF STERILE (DRAPES) IMPLANT
DECANTER SPIKE VIAL GLASS SM (MISCELLANEOUS) ×4 IMPLANT
DERMABOND ADVANCED (GAUZE/BANDAGES/DRESSINGS) ×2
DERMABOND ADVANCED .7 DNX12 (GAUZE/BANDAGES/DRESSINGS) ×6 IMPLANT
DRAIN CHANNEL RND F F (WOUND CARE) IMPLANT
DRAIN PENROSE 0.5X18 (DRAIN) IMPLANT
DRAPE ARM DVNC X/XI (DISPOSABLE) ×12 IMPLANT
DRAPE COLUMN DVNC XI (DISPOSABLE) ×3 IMPLANT
DRAPE DA VINCI XI ARM (DISPOSABLE) ×4
DRAPE DA VINCI XI COLUMN (DISPOSABLE) ×1
DRSG TEGADERM 4X4.75 (GAUZE/BANDAGES/DRESSINGS) IMPLANT
ELECT REM PT RETURN 15FT ADLT (MISCELLANEOUS) ×4 IMPLANT
GAUZE 4X4 16PLY RFD (DISPOSABLE) IMPLANT
GLOVE BIO SURGEON STRL SZ 6.5 (GLOVE) ×8 IMPLANT
GLOVE BIOGEL M STRL SZ7.5 (GLOVE) ×12 IMPLANT
GLOVE BIOGEL PI IND STRL 7.5 (GLOVE) ×3 IMPLANT
GLOVE BIOGEL PI INDICATOR 7.5 (GLOVE) ×1
GOWN STRL REUS W/TWL LRG LVL3 (GOWN DISPOSABLE) ×20 IMPLANT
IRRIG SUCT STRYKERFLOW 2 WTIP (MISCELLANEOUS) ×4
IRRIGATION SUCT STRKRFLW 2 WTP (MISCELLANEOUS) ×3 IMPLANT
KIT PROCEDURE DA VINCI SI (MISCELLANEOUS) ×1
KIT PROCEDURE DVNC SI (MISCELLANEOUS) ×3 IMPLANT
KIT TURNOVER KIT A (KITS) IMPLANT
LOOP VESSEL MAXI BLUE (MISCELLANEOUS) ×4 IMPLANT
MANIFOLD NEPTUNE II (INSTRUMENTS) ×4 IMPLANT
NEEDLE ASPIRATION 22 (NEEDLE) ×4 IMPLANT
NEEDLE INSUFFLATION 14GA 120MM (NEEDLE) ×4 IMPLANT
PACK CYSTO (CUSTOM PROCEDURE TRAY) ×4 IMPLANT
PACK ROBOT UROLOGY CUSTOM (CUSTOM PROCEDURE TRAY) ×4 IMPLANT
PAD POSITIONING PINK XL (MISCELLANEOUS) ×4 IMPLANT
PORT ACCESS TROCAR AIRSEAL 12 (TROCAR) ×3 IMPLANT
PORT ACCESS TROCAR AIRSEAL 5M (TROCAR) ×1
RELOAD STAPLER GREEN 60MM (STAPLE) ×24 IMPLANT
RELOAD STAPLER WHITE 60MM (STAPLE) ×24 IMPLANT
RETRACTOR LONRSTAR 16.6X16.6CM (MISCELLANEOUS) IMPLANT
RETRACTOR STAY HOOK 5MM (MISCELLANEOUS) IMPLANT
RETRACTOR STER APS 16.6X16.6CM (MISCELLANEOUS)
RTRCTR WOUND ALEXIS 18CM MED (MISCELLANEOUS) ×4
SCISSORS LAP 5X45 EPIX DISP (ENDOMECHANICALS) ×4 IMPLANT
SEAL CANN UNIV 5-8 DVNC XI (MISCELLANEOUS) ×12 IMPLANT
SEAL XI 5MM-8MM UNIVERSAL (MISCELLANEOUS) ×4
SET TRI-LUMEN FLTR TB AIRSEAL (TUBING) ×4 IMPLANT
SOLUTION ELECTROLUBE (MISCELLANEOUS) ×4 IMPLANT
SPONGE LAP 18X18 RF (DISPOSABLE) ×4 IMPLANT
SPONGE LAP 4X18 RFD (DISPOSABLE) ×4 IMPLANT
STAPLER ECHELON LONG 60 440 (INSTRUMENTS) ×4 IMPLANT
STAPLER RELOAD GREEN 60MM (STAPLE) ×32
STAPLER RELOAD WHITE 60MM (STAPLE) ×32
STENT SET URETHERAL LEFT 7FR (STENTS) ×4 IMPLANT
STENT SET URETHERAL RIGHT 7FR (STENTS) ×4 IMPLANT
SURGIFLO W/THROMBIN 8M KIT (HEMOSTASIS) IMPLANT
SUT CHROMIC 4 0 RB 1X27 (SUTURE) ×4 IMPLANT
SUT ETHILON 3 0 PS 1 (SUTURE) ×4 IMPLANT
SUT MNCRL AB 4-0 PS2 18 (SUTURE) ×8 IMPLANT
SUT PDS AB 0 CTX 36 PDP370T (SUTURE) ×12 IMPLANT
SUT SILK 3 0 SH 30 (SUTURE) IMPLANT
SUT SILK 3 0 SH CR/8 (SUTURE) IMPLANT
SUT V-LOC BARB 180 2/0GR6 GS22 (SUTURE) ×8
SUT VIC AB 2-0 CT1 27 (SUTURE)
SUT VIC AB 2-0 CT1 27XBRD (SUTURE) IMPLANT
SUT VIC AB 2-0 SH 18 (SUTURE) IMPLANT
SUT VIC AB 2-0 UR5 27 (SUTURE) ×20 IMPLANT
SUT VIC AB 3-0 SH 27 (SUTURE) ×8
SUT VIC AB 3-0 SH 27X BRD (SUTURE) ×6 IMPLANT
SUT VIC AB 3-0 SH 27XBRD (SUTURE) ×18 IMPLANT
SUT VIC AB 4-0 RB1 27 (SUTURE) ×4
SUT VIC AB 4-0 RB1 27XBRD (SUTURE) ×12 IMPLANT
SUT VICRYL 0 UR6 27IN ABS (SUTURE) ×4 IMPLANT
SUT VLOC BARB 180 ABS3/0GR12 (SUTURE) ×4
SUTURE V-LC BRB 180 2/0GR6GS22 (SUTURE) ×6 IMPLANT
SUTURE VLOC BRB 180 ABS3/0GR12 (SUTURE) ×3 IMPLANT
SYR CONTROL 10ML LL (SYRINGE) IMPLANT
SYSTEM UROSTOMY GENTLE TOUCH (WOUND CARE) ×4 IMPLANT
TOWEL OR NON WOVEN STRL DISP B (DISPOSABLE) ×4 IMPLANT
TROCAR BLADELESS 15MM (ENDOMECHANICALS) ×4 IMPLANT
TROCAR XCEL NON-BLD 5MMX100MML (ENDOMECHANICALS) IMPLANT
TUBING CONNECTING 10 (TUBING) IMPLANT
WATER STERILE IRR 1000ML POUR (IV SOLUTION) ×4 IMPLANT
WATER STERILE IRR 3000ML UROMA (IV SOLUTION) ×4 IMPLANT

## 2020-02-20 NOTE — Anesthesia Postprocedure Evaluation (Signed)
Anesthesia Post Note  Patient: Susan Snyder  Procedure(s) Performed: XI ROBOTIC ASSISTED LAPAROSCOPIC COMPLETE CYSTECT ILEAL CONDUIT AND BILATERAL OOPHORECTOMY (N/A Abdomen) LYMPHADENECTOMY (Bilateral Abdomen) CYSTOSCOPY WITH INJECTION OF INDOCYANINE GREEN DYE (N/A )     Patient location during evaluation: PACU Anesthesia Type: General Level of consciousness: awake and alert Pain management: pain level controlled Vital Signs Assessment: post-procedure vital signs reviewed and stable Respiratory status: spontaneous breathing, nonlabored ventilation, respiratory function stable and patient connected to nasal cannula oxygen Cardiovascular status: blood pressure returned to baseline and stable Postop Assessment: no apparent nausea or vomiting Anesthetic complications: no   No complications documented.  Last Vitals:  Vitals:   02/20/20 0510 02/20/20 0729  BP: 132/69 (!) 171/89  Pulse: 76 63  Resp: 18 17  Temp: 36.6 C   SpO2: 98% 100%    Last Pain:  Vitals:   02/20/20 0729  TempSrc: Oral  PainSc:                  Nakayla Rorabaugh

## 2020-02-20 NOTE — Anesthesia Procedure Notes (Signed)
Procedure Name: Intubation Date/Time: 02/20/2020 8:43 AM Performed by: Niel Hummer, CRNA Pre-anesthesia Checklist: Patient identified, Emergency Drugs available, Suction available and Patient being monitored Patient Re-evaluated:Patient Re-evaluated prior to induction Oxygen Delivery Method: Circle system utilized Preoxygenation: Pre-oxygenation with 100% oxygen Ventilation: Mask ventilation without difficulty Laryngoscope Size: Mac and 4 Grade View: Grade I Tube type: Oral Tube size: 7.0 mm Number of attempts: 1 Airway Equipment and Method: Stylet Placement Confirmation: ETT inserted through vocal cords under direct vision,  positive ETCO2 and breath sounds checked- equal and bilateral Secured at: 23 cm Tube secured with: Tape Dental Injury: Teeth and Oropharynx as per pre-operative assessment

## 2020-02-20 NOTE — Progress Notes (Signed)
Pt currently NPO. MD on call aware of NPO status and po meds scheduled. MD advised to hold all po meds until provider rounds in the morning.

## 2020-02-20 NOTE — Transfer of Care (Signed)
Immediate Anesthesia Transfer of Care Note  Patient: Susan Snyder  Procedure(s) Performed: XI ROBOTIC ASSISTED LAPAROSCOPIC COMPLETE CYSTECT ILEAL CONDUIT AND BILATERAL OOPHORECTOMY (N/A Abdomen) LYMPHADENECTOMY (Bilateral Abdomen) CYSTOSCOPY WITH INJECTION OF INDOCYANINE GREEN DYE (N/A )  Patient Location: PACU  Anesthesia Type:General  Level of Consciousness: awake, alert  and oriented  Airway & Oxygen Therapy: Patient Spontanous Breathing and Patient connected to face mask oxygen  Post-op Assessment: Report given to RN, Post -op Vital signs reviewed and stable and Patient moving all extremities X 4  Post vital signs: Reviewed and stable  Last Vitals:  Vitals Value Taken Time  BP 147/89 02/20/20 1436  Temp    Pulse 99 02/20/20 1437  Resp 17 02/20/20 1437  SpO2 100 % 02/20/20 1437  Vitals shown include unvalidated device data.  Last Pain:  Vitals:   02/20/20 0729  TempSrc: Oral  PainSc:          Complications: No complications documented.

## 2020-02-20 NOTE — Progress Notes (Signed)
Day of Surgery   Subjective/Chief Complaint:   1 - High Grade / Aggressive Variant Bladder Cancer - T1G3 posterior and dome tumors by TURBT 09/2019 and 10/2019 found on eval irritative voiding by Dr. Erlene Quan in [place]. CT clinically localized, single ureters bilaterally. 50% giant cell differentiation (very aggressive subtype) and CIS even at re-resection.    Today " Susan Snyder " is seen to proceed with curative intent cystectomy and conduit diversion. C19 screen and MRSA screen negative. Cr<1. Hgb 12.8. Completed bowel prep to clear and stomal marking.    Objective: Vital signs in last 24 hours: Temp:  [97.8 F (36.6 C)-98.2 F (36.8 C)] 97.8 F (36.6 C) (12/01 0510) Pulse Rate:  [63-86] 63 (12/01 0729) Resp:  [17-20] 17 (12/01 0729) BP: (132-171)/(69-89) 171/89 (12/01 0729) SpO2:  [98 %-100 %] 100 % (12/01 0729) Weight:  [103.6 kg] 103.6 kg (11/30 1705) Last BM Date: 02/19/20  Intake/Output from previous day: 11/30 0701 - 12/01 0700 In: 557.7 [I.V.:357.7; IV Piggyback:200] Out: -  Intake/Output this shift: No intake/output data recorded.  Physical Exam Vitals reviewed.  Constitutional:      Comments: AOx3. Very pleasant. In pre-op HOlding.  HENT:     Head: Normocephalic.     Nose: Nose normal.  Eyes:     Pupils: Pupils are equal, round, and reactive to light.  Cardiovascular:     Rate and Rhythm: Normal rate.  Abdominal:     Comments: Stable truncal obesity.  Stomal marking sites x2 noted. And priority.  Genitourinary:    Comments: No CVAT at present.  Musculoskeletal:        General: Normal range of motion.     Cervical back: Normal range of motion.  Skin:    General: Skin is warm.  Neurological:     General: No focal deficit present.     Mental Status: She is alert.  Psychiatric:        Mood and Affect: Mood normal.    Lab Results:  Recent Labs    02/19/20 1719  WBC 8.6  HGB 12.8  HCT 40.4  PLT 272   BMET Recent Labs    02/19/20 1719  NA 139   K 4.1  CL 104  CO2 25  GLUCOSE 99  BUN 17  CREATININE 0.98  CALCIUM 9.1   PT/INR No results for input(s): LABPROT, INR in the last 72 hours. ABG No results for input(s): PHART, HCO3 in the last 72 hours.  Invalid input(s): PCO2, PO2  Studies/Results: No results found.  Anti-infectives: Anti-infectives (From admission, onward)   Start     Dose/Rate Route Frequency Ordered Stop   02/20/20 0600  clindamycin (CLEOCIN) IVPB 900 mg        900 mg 100 mL/hr over 30 Minutes Intravenous On call to O.R. 02/19/20 1643 02/21/20 0559   02/19/20 2000  neomycin (MYCIFRADIN) tablet 500 mg        500 mg Oral Every 4 hours 02/19/20 1651 02/20/20 0009   02/19/20 2000  metroNIDAZOLE (FLAGYL) tablet 500 mg        500 mg Oral Every 4 hours 02/19/20 1651 02/20/20 0008   02/19/20 1800  ciprofloxacin (CIPRO) IVPB 400 mg        400 mg 200 mL/hr over 60 Minutes Intravenous Every 12 hours 02/19/20 1643        Assessment/Plan:  Proceed as planned with major extirpative surgery for aggressive bladder cancer. Risks, benefits, alternatives, expected peri-op course discussed extensively previously and reiterated today. No  additional questions today. Her daughter Susan Snyder is contact and avail by phone today.   Alexis Frock 02/20/2020

## 2020-02-20 NOTE — Brief Op Note (Signed)
02/20/2020  2:37 PM  PATIENT:  Susan Snyder  54 y.o. female  PRE-OPERATIVE DIAGNOSIS:  BLADDER CANCER  POST-OPERATIVE DIAGNOSIS:  BLADDER CANCER  PROCEDURE:  Procedure(s) with comments: XI ROBOTIC ASSISTED LAPAROSCOPIC COMPLETE CYSTECT ILEAL CONDUIT AND BILATERAL OOPHORECTOMY (N/A) - 6 HRS LYMPHADENECTOMY (Bilateral) CYSTOSCOPY WITH INJECTION OF INDOCYANINE GREEN DYE (N/A)  SURGEON:  Surgeon(s) and Role:    Alexis Frock, MD - Primary  PHYSICIAN ASSISTANT:   ASSISTANTS: Karlton Lemon MD   ANESTHESIA:   spinal  EBL:  200 mL   BLOOD ADMINISTERED:none  DRAINS: 1- JP to bulb; 2 - Urosotmy to gravity wtih Rt (red) and Lt (blue) bander stents   LOCAL MEDICATIONS USED:  MARCAINE     SPECIMEN:  Source of Specimen:  bladder + ovaries + anterior vaginal wall + cervix, pelvic lymp nodes, ureteral margins.   DISPOSITION OF SPECIMEN:  PATHOLOGY  COUNTS:  YES  TOURNIQUET:  * No tourniquets in log *  DICTATION: .Other Dictation: Dictation Number [id number]  PLAN OF CARE: Admit to inpatient   PATIENT DISPOSITION:  PACU - hemodynamically stable.   Delay start of Pharmacological VTE agent (>24hrs) due to surgical blood loss or risk of bleeding: yes

## 2020-02-20 NOTE — Discharge Instructions (Signed)
1- Drain Sites - You Peugh have some mild persistent drainage from old drain site for several days, this is normal. This can be covered with cotton gauze for convenience.  2 - Stiches - Your stitches are all dissolvable. You [place] notice a "loose thread" at your incisions, these are normal and require no intervention. You [place] cut them flush to the skin with fingernail clippers if needed for comfort.  3 - Diet - No restrictions  4 - Activity - No heavy lifting / straining (any activities that require valsalva or "bearing down") x 4 weeks. Otherwise, no restrictions.  5 - Bathing - You [place] shower immediately. Do not take a bath or get into swimming pool where incision sites are submersed in water x 4 weeks.   6 -  When to Call the Doctor - Call MD for any fever >102, any acute wound problems, or any severe nausea / vomiting. You can call the [facility] ([phone]) 24 hours a day 365 days a year. It will roll-over to the answering service and on-call physician after hours.  

## 2020-02-21 ENCOUNTER — Encounter (HOSPITAL_COMMUNITY): Payer: Self-pay | Admitting: Urology

## 2020-02-21 LAB — BASIC METABOLIC PANEL
Anion gap: 7 (ref 5–15)
BUN: 20 mg/dL (ref 6–20)
CO2: 24 mmol/L (ref 22–32)
Calcium: 8.3 mg/dL — ABNORMAL LOW (ref 8.9–10.3)
Chloride: 108 mmol/L (ref 98–111)
Creatinine, Ser: 1.26 mg/dL — ABNORMAL HIGH (ref 0.44–1.00)
GFR, Estimated: 50 mL/min — ABNORMAL LOW (ref >60)
Glucose, Bld: 123 mg/dL — ABNORMAL HIGH (ref 70–99)
Potassium: 5.4 mmol/L — ABNORMAL HIGH (ref 3.5–5.1)
Sodium: 139 mmol/L (ref 135–145)

## 2020-02-21 LAB — CBC
HCT: 36.1 % (ref 36.0–46.0)
Hemoglobin: 11.1 g/dL — ABNORMAL LOW (ref 12.0–15.0)
MCH: 29.4 pg (ref 26.0–34.0)
MCHC: 30.7 g/dL (ref 30.0–36.0)
MCV: 95.8 fL (ref 80.0–100.0)
Platelets: 288 10*3/uL (ref 150–400)
RBC: 3.77 MIL/uL — ABNORMAL LOW (ref 3.87–5.11)
RDW: 13.7 % (ref 11.5–15.5)
WBC: 11.5 10*3/uL — ABNORMAL HIGH (ref 4.0–10.5)
nRBC: 0 % (ref 0.0–0.2)

## 2020-02-21 MED ORDER — ENOXAPARIN SODIUM 40 MG/0.4ML ~~LOC~~ SOLN
40.0000 mg | SUBCUTANEOUS | Status: DC
  Administered 2020-02-21: 40 mg via SUBCUTANEOUS
  Filled 2020-02-21: qty 0.4

## 2020-02-21 MED ORDER — SODIUM CHLORIDE 0.9 % IV BOLUS
500.0000 mL | Freq: Once | INTRAVENOUS | Status: AC
  Administered 2020-02-21: 500 mL via INTRAVENOUS

## 2020-02-21 MED ORDER — DEXTROSE-NACL 5-0.45 % IV SOLN
INTRAVENOUS | Status: DC
  Filled 2020-02-21 (×2): qty 1000

## 2020-02-21 NOTE — Plan of Care (Signed)
  Problem: Education: Goal: Knowledge of General Education information will improve Description: Including pain rating scale, medication(s)/side effects and non-pharmacologic comfort measures Outcome: Progressing   Problem: Clinical Measurements: Goal: Respiratory complications will improve Outcome: Progressing Goal: Cardiovascular complication will be avoided Outcome: Progressing   Problem: Coping: Goal: Level of anxiety will decrease Outcome: Progressing   Problem: Elimination: Goal: Will not experience complications related to urinary retention Outcome: Progressing   Problem: Pain Managment: Goal: General experience of comfort will improve Outcome: Progressing   Problem: Safety: Goal: Ability to remain free from injury will improve Outcome: Progressing   Problem: Skin Integrity: Goal: Risk for impaired skin integrity will decrease Outcome: Progressing

## 2020-02-21 NOTE — Consult Note (Addendum)
[place] Nurse ostomy follow up Stoma type/location: RLQ, ileal conduit 1 red and 1 blue stent in place  Stomal assessment/size: 1 1/4" slightly oval Peristomal assessment: dips in the abdominal topography at 3 and 9 o'clock  Treatment options for stomal/peristomal skin: 2" skin barrier ring Output bloody urine Ostomy pouching: 1pc.flat with 2" skin barrier ring to conform to abdominal roundness at stoma site  Education provided:  Explained role of ostomy nurse and creation of stoma  Explained stoma characteristics (budded, flush, color, texture, care) Demonstrated pouch change (cutting new barrier, measuring stoma, cleaning peristomal skin and stoma, use of barrier ring) Education on use wick in stoma to keep skin dry with pouch change Education on emptying when 1/3 to 1/2 full and how to empty Demonstrated hooking pouch to nighttime drainage bag  Enrolled patient in [employer] Discharge program: Yes  WOC Nurse Consult Note: Wound type: dehisced  trocar site; just opened prior to my visit  Pressure Injury POA: NA Measurement: 0.7cm x 0.3cm x 0.2cm  Wound bed:100 % clean, pink Drainage (amount, consistency, odor) serosanguineous  Periwound: intact; redness noted proximally at additional trocar site Dressing procedure/placement/frequency: benzoine applied to peri wound, steri strips applied    Notified Dr. Tresa Moore and Dr. Al Pimple about the trocar sites and darkness of the stoma via [place] Nurse will follow along with you for continued support with ostomy teaching and care [place] MSN, [place], [place], [place], [place]

## 2020-02-21 NOTE — Evaluation (Signed)
Physical Therapy Evaluation Patient Details Name: Susan Snyder MRN: [id number] DOB: [birth year] Today's Date: 02/21/2020   History of Present Illness  Pt with bladder CA and now s/p Laporoscopic radical cystectomy with bilateral salpingo-oophorectomy and ileal conduit urinary diversion.  Pt with hx of Bil THR, back surgery x 3, and DDD  Clinical Impression  Pt admitted as above and presenting with functional mobility limitations 2* post op pain, balance deficits and orthostatic BP with attempts at OOB activity.   BP this date:  Supine 127/70; sit 128/73; stand 105/81; stand ~2 minutes 88/63 - RN aware.  Pt very motivated and should progress to dc home with family assist.    Follow Up Recommendations No PT follow up    Equipment Recommendations  Other (comment) (TBD)    Recommendations for Other Services       Precautions / Restrictions Precautions Precautions: Fall;Other (comment) Precaution Comments: JP drain on R and ileostomy tube to cather on R  Restrictions Weight Bearing Restrictions: No      Mobility  Bed Mobility Overal bed mobility: Needs Assistance Bed Mobility: Rolling;Sidelying to Sit;Sit to Sidelying Rolling: Min assist;Mod assist Sidelying to sit: Min assist;+2 for physical assistance;+2 for safety/equipment     Sit to sidelying: Min assist;Mod assist;+2 for physical assistance;+2 for safety/equipment General bed mobility comments: Use of bedrail, assist with LEs and to control trunk    Transfers Overall transfer level: Needs assistance Equipment used: Rolling walker (2 wheeled) Transfers: Sit to/from Stand Sit to Stand: From elevated surface;+2 physical assistance;+2 safety/equipment;Min assist         General transfer comment: assist to bring wt up and fwd and to balance in standing  Ambulation/Gait             General Gait Details: Pt stood only - ambulation deferred with symptomatic orthostatic BP  Stairs            Wheelchair  Mobility    Modified Rankin (Stroke Patients Only)       Balance Overall balance assessment: Needs assistance Sitting-balance support: Feet supported;No upper extremity supported Sitting balance-Leahy Scale: Fair     Standing balance support: Bilateral upper extremity supported Standing balance-Leahy Scale: Poor                               Pertinent Vitals/Pain Pain Assessment: Faces Faces Pain Scale: Hurts even more Pain Location: abdomen with movement Pain Descriptors / Indicators: Grimacing;Guarding Pain Intervention(s): Limited activity within patient's tolerance;Monitored during session    Home Living Family/patient expects to be discharged to:: Private residence Living Arrangements: Children Available Help at Discharge: Family Type of Home: House Home Access: Stairs to enter Entrance Stairs-Rails: Right Entrance Stairs-Number of Steps: 4 Home Layout: One level Home Equipment: None      Prior Function Level of Independence: Independent               Hand Dominance        Extremity/Trunk Assessment   Upper Extremity Assessment Upper Extremity Assessment: Overall WFL for tasks assessed    Lower Extremity Assessment Lower Extremity Assessment: Overall WFL for tasks assessed    Cervical / Trunk Assessment Cervical / Trunk Assessment: Normal  Communication   Communication: No difficulties  Cognition Arousal/Alertness: Awake/alert Behavior During Therapy: WFL for tasks assessed/performed Overall Cognitive Status: Within Functional Limits for tasks assessed  General Comments      Exercises     Assessment/Plan    PT Assessment Patient needs continued PT services  PT Problem List Decreased activity tolerance;Decreased balance;Decreased mobility;Decreased knowledge of use of DME;Pain;Obesity       PT Treatment Interventions DME instruction;Gait training;Stair  training;Functional mobility training;Therapeutic activities;Therapeutic exercise;Balance training;Patient/family education    PT Goals (Current goals can be found in the Care Plan section)  Acute Rehab PT Goals Patient Stated Goal: Regain IND PT Goal Formulation: With patient Time For Goal Achievement: 03/06/20 Potential to Achieve Goals: Good    Frequency Min 3X/week   Barriers to discharge        Co-evaluation               AM-PAC PT "6 Clicks" Mobility  Outcome Measure Help needed turning from your back to your side while in a flat bed without using bedrails?: A Little Help needed moving from lying on your back to sitting on the side of a flat bed without using bedrails?: A Lot Help needed moving to and from a bed to a chair (including a wheelchair)?: A Lot Help needed standing up from a chair using your arms (e.g., wheelchair or bedside chair)?: A Lot Help needed to walk in hospital room?: A Lot Help needed climbing 3-5 steps with a railing? : A Lot 6 Click Score: 13    End of Session Equipment Utilized During Treatment: Gait belt Activity Tolerance: Other (comment) (orthostatic) Patient left: in bed Nurse Communication: Mobility status PT Visit Diagnosis: Difficulty in walking, not elsewhere classified (R26.2);Pain    Time: 1555-1630 PT Time Calculation (min) (ACUTE ONLY): 35 min   Charges:   PT Evaluation $PT Eval Low Complexity: 1 Low PT Treatments $Therapeutic Activity: 8-22 mins        Debe Coder PT Acute Rehabilitation Services Pager [phone] Office [phone]   Susan Snyder 02/21/2020, 4:45 PM

## 2020-02-21 NOTE — Plan of Care (Signed)

## 2020-02-21 NOTE — Op Note (Signed)
NAME: Susan Snyder, Susan Snyder MEDICAL RECORD [id number] ACCOUNT [id number] DATE OF BIRTH:Nov 25, 1965 FACILITY: [facility] LOCATION: [id number] PHYSICIAN:Cenia Zaragosa Tresa Moore, MD  OPERATIVE REPORT  DATE OF PROCEDURE:  02/20/2020  PREOPERATIVE DIAGNOSIS:  Aggressive variant bladder cancer recurrence.  PROCEDURES: 1.  Cystoscopy with injection of indocyanine green dye. 2.  Robotic-assisted laparoscopic radical cystectomy with bilateral salpingo-oophorectomy and pelvic lymph node dissection, ileal conduit urinary diversion.  ASSISTANT:Alesyn Demzik MD  ESTIMATED BLOOD LOSS:  200 mL.  COMPLICATIONS:  None.  SPECIMENS: 1.  Right external iliac lymph nodes, right obturator lymph nodes, right common iliac lymph nodes, aortic bifurcation lymph nodes, left common iliac lymph nodes, left external iliac lymph nodes, left obturator lymph nodes, sentinel lymph nodes noted as  such on pathology requisition. 2.  Bladder plus urethra plus anterior vaginal wall plus cervix plus bilateral ovaries en bloc.  DRAINS: 1.  Jackson-Pratt drain was suctioned. 2.  Right lower quadrant urostomy to gravity drainage with right (red), left (blue) bander stents in situ.  FINDINGS: 1.  Small volume recurrence bladder tumor.  It was apparently papillary appearing tumor in the urinary bladder, mostly at the intertrigonal and dome area. 2.  Sentinel lymph nodes noted in the left hemipelvis and left external, left obturator groups respectively. 3.  Significant amount of mostly subcutaneous fat as anticipated. 4.  Evidence of prior supracervical hysterectomy, cervix in situ.  INDICATIONS:  The patient is a very pleasant 54 year old woman who was found to have aggressive variant stage I bladder cancer by one of our colleagues in [place] area.  It was also rapidly recurrent on re-resection with significant glandular  differentiation.  Given the aggressive variant histology, options were discussed for management including trial of bladder  sparing protocols versus more aggressive curative-intent therapy with radical cystectomy and urinary diversion, and she wished to proceed with  the latter with conduit diversion.  Informed consent was obtained and placed in medical record.  She was admitted yesterday for bowel prep, stomal marking, and labs, which she tolerated well.  DESCRIPTION OF PROCEDURE:  The patient being Susan Snyder, procedure being cystoscopy with injection of indocyanine green dye, radical cystectomy with removal of tubes, bilateral lymph nodes, and conduit diversion was confirmed.  Procedure timeout was  performed.  Intravenous antibiotics were administered.  General endotracheal anesthesia was induced.  The patient was placed into a low lithotomy position.  Her arms were tucked at her side using gel rolls.  She was further fastened to operative table  using 3-inch tape over foam padding across the supraxiphoid chest.  A test of steep Trendelenburg positioning was performed and found to be suitably positioned.  Cystourethroscopy was performed using 24-French injection scope set with 0-degree lens.   Inspection of bladder revealed multifocal relatively small volume papillary and some nodular tumor, mostly in the intertrigonal area, some at the dome.  Next, 2 mL of indocyanine green dye was injected across 6 different submucosal blebs in the areas of  tumor for sentinel node angiography and tumor marking.  A silicone type Foley catheter was then placed per urethra straight drain 10 mL sterile in the balloon.  The patient was then repositioned into steep Trendelenburg positioning, and high-flow,  low-pressure pneumoperitoneum was obtained using Veress technique in the supraumbilical midline having passed the aspiration and drop test.  An 8 mm robotic camera port was then placed in same location.  Laparoscopic examination of peritoneal cavity  revealed some adhesions, no visceral injury, evidence of prior hysterectomy, bilateral  ovaries in situ.  Additional ports  were placed as follows:  Right paramedian 8 mm robotic port, right paramedian 15 mm assistant port at the previously marked  supraumbilical stomal site, right far lateral 12 mm AirSeal assist port, left paramedian 8 mm robotic port, left far lateral 8 mm robotic port.  Robot was docked and passed electronic checks.  Initial attention was directed at right-sided retroperitoneal  dissection.  Incision was made lateral to the ascending colon from the area of the cecum superiorly at distance of approximately 8 cm and then distally just lateral to the ovary and right medial umbilical ligament anteriorly toward the anterior  abdominal wall creating a large retroperitoneal posterior peritoneal flap.  The gonadal vessels were encountered coursing towards the ovaries.  These were controlled using extra large Hem-o-lok clip, 2 proximal, 1 distal, and then coldly transecting.   Using peritoneal flap as a medial retractor, dissection proceeded on iliac vessels towards the area of the ureter.  Ureter was easily identified and marked with vessel loop, dissected proximally at distance of approximately 6 cm above the iliac crossing  and distally to the ureterovesical junction, which was doubly clipped and ligated with proximal clip containing a white tagged suture, and the right ureter was tugged out of the true pelvis.  Frozen section negative for carcinoma of the right distal  ureter.  Attention was directed at right-sided lymph node dissection.  First, all fibrofatty tissue within the boundaries of the right external iliac artery, vein, pelvic sidewall, iliac bifurcation was dissected free.  Lymphostasis achieved with cold  clips, set aside labeled right external iliac lymph nodes.  Next, fibrofatty tissue with the boundaries of the right external iliac vein, pelvic sidewall, obturator nerve was dissected free, set aside labeled right obturator lymph nodes.  Right obturator  nerve was  inspected following maneuvers and found to be uninjured.  This inherently also removed area encompassing the internal iliac group as well.  Next, fibrofatty tissue overlying the right common iliac lymph nodes was dissected free, boundaries  being iliac bifurcation and aortic bifurcation, and the retroperitoneal incision was carried medially in a Y-shaped fashion along the common iliac vessels towards the area of the aortic bifurcation and additional lymphatic tissue was taken from this  site, labeled as aortic bifurcation lymph nodes.  The ileocecal junction was then identified.  There was evidence of prior appendectomy.  An area of distal ileum was marked with a silk tagged clip at the area of the epiploic fat approximately 14 cm  proximal to ileocecal junction for later conduit formation and a single clip placed distal to this to denote proximal distal orientation.  Attention was directed at left-sided retroperitoneal dissection.  Incision was made lateral to the descending colon  from the area of the iliac vessels proximally for a distance of approximately 10 cm and distally coursing lateral to the left median umbilical ligament towards the anterior abdominal wall.  This created a large left retroperitoneal flap, which was then  used to retract the bowel medially.  Left ureter was similarly encountered as it coursed over the iliac vessels, marked with vessel loop, dissected proximally for a distance of approximately 8 cm above the iliac crossing and distally to the  ureterovesical junction, which was doubly clipped and ligated proximal and containing a dyed tag suture.  Frozen section negative for carcinoma.  This was then tugged out of the true pelvis.  Attention was directed at left lymph node dissection.  It was  performed using the exact same boundaries as per  the right.  There were several sentinel lymph nodes noted on the left side.  These were denoted on pathology requisition.  Similarly, the left  obturator nerve was inspected following maneuvers and found to  be uninjured.  The endopelvic fascia was then dissected free on both sides towards the area of the urethral meatus.  Attention was then directed at posterior dissection.  A sponge stick was placed per vagina in the posterior vaginal fornix, proceeded  directly onto this into the vagina, and an incision was made in the lateral aspect of the mucosa towards the area of the urethral meatus thus creating a posterior vaginal flap for later reconstruction.  This inherently exposed the vascular pedicles of  the bladder bilaterally.  These were controlled using white load stapler x3 each side.  This resulted in excellent hemostatic control of the bladder pedicles.  Attention was directed at anterior dissection.  The space of Retzius developed between the  medial umbilical ligaments from the area of the umbilicus towards the area of the dorsal vein of the clitoris, which was then controlled using green load stapler and final apical dissection was performed using cautery scissors, thus, completely excising  the urethral meatus and purposely transecting the Foley catheter to be retained with proximal end of the UPJ with the specimen.  Next, the bladder plus cervix plus anterior vaginal wall plus bilateral ovaries specimen was placed into a short EndoCatch  bag for later retrieval.  Attention was directed at vaginal closure.  The posterior vaginal wall apex was dissected free to allow a clamshell-type reconstruction folding it anteriorly towards the area of the anterior vaginal fornix and double arm 2-0  V-Loc suture was used to anchor this and then 2 separate running suture lines to approximate.  Vaginal exam was then performed using indicator glove and laparoscopic vision, and there was complete closure of the vaginal cuff, and digital rectal exam was  performed similarly using indicator glove and no evidence of rectal violation was noted.  The left ureter  was then retroperitonealized via the previous retroperitoneal window anterior to the aortic bifurcation, and the left ureter, right ureter, distal  ileum tags sutures were placed into a single clip and a self-locking laparoscopic grasper.  A closed suction drain was brought to the left previous robotic port site into the peritoneal cavity and the specimen bag string was grasped via separate  laparoscopic grasper via left paramedian robotic port site such that the specimen bag did not cross any other retained ureteral or bowel structures.  Robot was then undocked.  Specimen was retrieved by extending the previous camera port site inferiorly  towards the area of the umbilicus and removing the en bloc specimen setting aside for permanent pathology.  A wound protector type retractor was then placed via this site and the right ureter, left ureter, distal ileum tag sutures were brought through  this site, and these structures were then separately grasped using Babcock graspers.  Attention was then directed at conduit formation.  A segment of distal ileum 14 cm in length at the previously marked segment, which was 14 cm proximal to the ileocecal  junction, was taken out of continuity using a green load stapler x1 each side.  The mesentery was developed using 2 staple loads distally and 1 staple load proximal taking exquisite care to avoid devascularization of the conduit or anastomotic segments.   The conduit segment was then aligned to retroperitoneal orientation.  Attention was directed to the bowel to bowel anastomosis,  which was performed using 2 loads of green load stapler in antimesenteric side-by-side fashion.  The free end was oversewn  using running silk with second imbricating layer of running silk.  The acute angle anastomosis was bolstered using interrupted silk, and the mesenteric defect was reapproximated using running silk to prevent internal hernia formation.  The bowel to bowel  anastomosis was  visibly viable and palpably patent.  It was redelivered into the abdominal cavity.  The distal staple line of the conduit segment was excised.  The proximal staple line was excluded using running Vicryl.  Attention was directed at  ureteroenteric anastomosis, first of the left ureter.  This was placed more proximally.  A segment of proximal conduit was excised approximately 4 mm in length and 4 mucosal everting sutures were applied in a quadrant fashion.  A heel stitch was applied  of 4-0 Vicryl, and a blue colored bander stent was advanced 26 cm anastomosis and ureteroenteric anastomoses were then performed using 2 separate running suture lines in a heel stitch forward, which resulted in excellent tension-free apposition.  A  single chromic stitch was applied through and through the stent in the mid portion of the conduit to prevent inadvertent early displacement.  I was quite happy with the left side anastomosis.  The right-sided ureteric anastomosis was performed exactly as  per the left, but approximately 2 cm more distal to conduit again close to the  mesenteric border and placing a red colored bander stent 26 cm anastomosis.  Final margins were sent of both sides.  Next, a quarter-sized diameter column of skin and subcutaneous tissue  was excised at the previously marked stomal site and 15 mm port site to the level of the fascia, which was dilated to accommodate 2 surgeon's fingers, and four 2-0 Vicryl sutures were placed in a quadrant fashion over the fascia and the conduit.  Distal  stents were brought through this, and the conduit was anchored at the fascia in a quadrant fashion to prevent parastomal hernia formation.  Conduit at this point was felt to be of sufficient length and vascularity despite the quite long distance  of tunneling given her adiposity.  The fascia was then closed using figure-of-eight PDS times approximately 6.  Scar was reapproximated with running Vicryl.  All incision sites  were infiltrated with dilute lipolyzed Marcaine and closed at the level of  the skin using subcuticular Monocryl by Dermabond.  Final stoma maturation was performed in rosebud fashion x4 with intervening interrupted Vicryl x3 each quadrant.  Stoma appliance was placed and procedure terminated.  The patient tolerated the  procedure well.  No immediate complications.  The patient was taken to the postanesthesia care unit in stable condition with plan for progressive care admission.  IN/NUANCE  D:02/20/2020 T:02/20/2020 JOB:[id number]

## 2020-02-21 NOTE — Progress Notes (Addendum)
1 Day Post-Op Subjective: Feeling ok this morning, didn't get much sleep Pain controlled Nausea/vomiting post op that resolved this morning No flatus, BM  Good UOP, blood tinged Hgb stable  Objective: Vital signs in last 24 hours: Temp:  [97.7 F (36.5 C)-98.6 F (37 C)] 98.4 F (36.9 C) (12/02 0517) Pulse Rate:  [63-113] 102 (12/02 0517) Resp:  [12-23] 18 (12/02 0517) BP: (131-171)/(65-102) 137/76 (12/02 0517) SpO2:  [94 %-100 %] 96 % (12/02 0517)  Intake/Output from previous day: 12/01 0701 - 12/02 0700 In: 2947 [I.V.:2848.7; IV Piggyback:98.3] Out: 1250 [Urine:750; Drains:300; Blood:200] Intake/Output this shift: No intake/output data recorded.  Physical Exam:  General: Alert and oriented CV: RRR Lungs: Clear Abdomen: Soft, ND Incisions: c/d/i, stoma in place, slightly dusky. Ostomy device and bilateral stents in place, blood tinged urine output Ext: NT, No erythema  Lab Results: Recent Labs    02/19/20 1719 02/20/20 1729 02/21/20 0426  HGB 12.8 11.7* 11.1*  HCT 40.4 37.1 36.1   BMET Recent Labs    02/19/20 1719 02/21/20 0426  NA 139 139  K 4.1 5.4*  CL 104 108  CO2 25 24  GLUCOSE 99 123*  BUN 17 20  CREATININE 0.98 1.26*  CALCIUM 9.1 8.3*     Studies/Results: No results found.  Assessment/Plan: 59 yoF POD#1 s/p cystectomy and IC with Dr. Tresa Moore on 12/1. Recovering appropriately at this time.   - continue pain control with scheduled tylenol, oxy/morphine prn - encourage deep breathing - advance to ice chips/sips today - continue mIVF - periop antibiotics to be completed today - start daily lovenox this morning - PT eval - ostomy teaching    LOS: 2 days   [facility] 02/21/2020, 7:15 AM   I have seen and examined the patient.  Briefly,  S: 1 - Bladder Cancer - s/p robotic cystectomy, bilateral oophorectomy, node dissection, ileal conduit 02/20/2020. Admitted day prior for bowel prep/stomal marking. Path pending.  2 - Post-Op  Ileus - bowel anastamosis at time of surgery 12/1. NPO initally post-op. Adv to clears POD 1.  3 - Disposition / Rehab - independatn in all ADL's at baseline. PT eval pending.  4 - Stomal Health - pt with obesity that is mostly subcutaeous with long distance between fascia and skin. Conduit stoma dusky POD 1. Proximal conduit with excellent blood supply intraop.  O: NAD, AOx3, at baseline Wearing glasses regualr mild tachycardia Non-labored breathing on minimal [place] O2 Obese abdomen. LLQ JP with non-foul serosanguinous fluid. RLQ Ostomy with Rt (red) and Lt (blue) stents. Stoma is dusky but not necrotic Mild non-foul vaginal spotting No c/c/e, SCD's in place.  A/P: Adv to clears, ambulate, continue daily labs. NPO p MN for possible OR endoscopy  / conduit revision v. Pull through tomorrow afternoon. Drew pt picutre of concern for distal conduit ischemia and radionale for investigation to r/o and revise. Hold lovenox.

## 2020-02-21 NOTE — Progress Notes (Signed)
Pt sat up on side of bed with extreme vertigo. Pt sat up for approx. 5 minutes. Vertigo did not subside. Patient assisted back to be with cool cloth.

## 2020-02-21 NOTE — Progress Notes (Signed)
When nurse and nurse tech changed patient's bed pad, nurse discovered that patient had a bloody pad and bleeding from vagina. Patient was aware of this based on surgery that patient had. Nurse and nurse tech cleaned patient.

## 2020-02-22 ENCOUNTER — Inpatient Hospital Stay (HOSPITAL_COMMUNITY): Payer: 59 | Admitting: Certified Registered Nurse Anesthetist

## 2020-02-22 ENCOUNTER — Encounter (HOSPITAL_COMMUNITY)
Admission: RE | Disposition: A | Discharge status: Home Health | Payer: Self-pay | Source: Ambulatory Visit | Attending: Urology

## 2020-02-22 ENCOUNTER — Encounter (HOSPITAL_COMMUNITY): Payer: Self-pay | Admitting: Urology

## 2020-02-22 HISTORY — PX: CYSTECTOMY: SHX5119

## 2020-02-22 LAB — CBC
HCT: 31 % — ABNORMAL LOW (ref 36.0–46.0)
Hemoglobin: 9.8 g/dL — ABNORMAL LOW (ref 12.0–15.0)
MCH: 30.2 pg (ref 26.0–34.0)
MCHC: 31.6 g/dL (ref 30.0–36.0)
MCV: 95.7 fL (ref 80.0–100.0)
Platelets: 254 10*3/uL (ref 150–400)
RBC: 3.24 MIL/uL — ABNORMAL LOW (ref 3.87–5.11)
RDW: 14 % (ref 11.5–15.5)
WBC: 10.8 10*3/uL — ABNORMAL HIGH (ref 4.0–10.5)
nRBC: 0 % (ref 0.0–0.2)

## 2020-02-22 LAB — BASIC METABOLIC PANEL
Anion gap: 7 (ref 5–15)
BUN: 15 mg/dL (ref 6–20)
CO2: 27 mmol/L (ref 22–32)
Calcium: 8.2 mg/dL — ABNORMAL LOW (ref 8.9–10.3)
Chloride: 107 mmol/L (ref 98–111)
Creatinine, Ser: 1.07 mg/dL — ABNORMAL HIGH (ref 0.44–1.00)
GFR, Estimated: >60 mL/min (ref >60)
Glucose, Bld: 123 mg/dL — ABNORMAL HIGH (ref 70–99)
Potassium: 4 mmol/L (ref 3.5–5.1)
Sodium: 141 mmol/L (ref 135–145)

## 2020-02-22 SURGERY — CYSTECTOMY, COMPLETE
Anesthesia: General

## 2020-02-22 MED ORDER — CHLORHEXIDINE GLUCONATE 0.12 % MT SOLN
15.0000 mL | Freq: Once | OROMUCOSAL | Status: AC
  Administered 2020-02-22: 15 mL via OROMUCOSAL

## 2020-02-22 MED ORDER — METOCLOPRAMIDE HCL 5 MG/ML IJ SOLN
10.0000 mg | Freq: Three times a day (TID) | INTRAMUSCULAR | Status: DC
  Administered 2020-02-22 – 2020-02-23 (×4): 10 mg via INTRAVENOUS
  Filled 2020-02-22 (×6): qty 2

## 2020-02-22 MED ORDER — FENTANYL CITRATE (PF) 250 MCG/5ML IJ SOLN
INTRAMUSCULAR | Status: DC | PRN
  Administered 2020-02-22: 75 ug via INTRAVENOUS
  Administered 2020-02-22: 100 ug via INTRAVENOUS

## 2020-02-22 MED ORDER — SUGAMMADEX SODIUM 200 MG/2ML IV SOLN
INTRAVENOUS | Status: DC | PRN
  Administered 2020-02-22: 200 mg via INTRAVENOUS

## 2020-02-22 MED ORDER — FENTANYL CITRATE (PF) 250 MCG/5ML IJ SOLN
INTRAMUSCULAR | Status: AC
  Filled 2020-02-22: qty 5

## 2020-02-22 MED ORDER — PROPOFOL 10 MG/ML IV BOLUS
INTRAVENOUS | Status: AC
  Filled 2020-02-22: qty 20

## 2020-02-22 MED ORDER — FENTANYL CITRATE (PF) 100 MCG/2ML IJ SOLN: 25.0000 ug | INTRAMUSCULAR | Status: DC | PRN

## 2020-02-22 MED ORDER — ONDANSETRON HCL 4 MG/2ML IJ SOLN
INTRAMUSCULAR | Status: AC
  Filled 2020-02-22: qty 2

## 2020-02-22 MED ORDER — ORAL CARE MOUTH RINSE: 15.0000 mL | Freq: Once | OROMUCOSAL | Status: AC

## 2020-02-22 MED ORDER — DEXAMETHASONE SODIUM PHOSPHATE 10 MG/ML IJ SOLN
INTRAMUSCULAR | Status: DC | PRN
  Administered 2020-02-22: 8 mg via INTRAVENOUS

## 2020-02-22 MED ORDER — DEXAMETHASONE SODIUM PHOSPHATE 10 MG/ML IJ SOLN
INTRAMUSCULAR | Status: AC
  Filled 2020-02-22: qty 1

## 2020-02-22 MED ORDER — CLINDAMYCIN PHOSPHATE 600 MG/50ML IV SOLN
600.0000 mg | Freq: Once | INTRAVENOUS | Status: AC
  Administered 2020-02-22: 600 mg via INTRAVENOUS
  Filled 2020-02-22: qty 50

## 2020-02-22 MED ORDER — PHENYLEPHRINE 40 MCG/ML (10ML) SYRINGE FOR IV PUSH (FOR BLOOD PRESSURE SUPPORT)
PREFILLED_SYRINGE | INTRAVENOUS | Status: DC | PRN
  Administered 2020-02-22 (×2): 80 ug via INTRAVENOUS

## 2020-02-22 MED ORDER — LACTATED RINGERS IV SOLN: INTRAVENOUS | Status: DC

## 2020-02-22 MED ORDER — CIPROFLOXACIN IN D5W 400 MG/200ML IV SOLN
400.0000 mg | Freq: Once | INTRAVENOUS | Status: AC
  Administered 2020-02-22: 400 mg via INTRAVENOUS
  Filled 2020-02-22: qty 200

## 2020-02-22 MED ORDER — MIDAZOLAM HCL 2 MG/2ML IJ SOLN
INTRAMUSCULAR | Status: DC | PRN
  Administered 2020-02-22: 2 mg via INTRAVENOUS

## 2020-02-22 MED ORDER — LIDOCAINE HCL (PF) 2 % IJ SOLN
INTRAMUSCULAR | Status: AC
  Filled 2020-02-22: qty 5

## 2020-02-22 MED ORDER — MIDAZOLAM HCL 2 MG/2ML IJ SOLN
INTRAMUSCULAR | Status: AC
  Filled 2020-02-22: qty 2

## 2020-02-22 MED ORDER — ONDANSETRON HCL 4 MG/2ML IJ SOLN: 4.0000 mg | Freq: Once | INTRAMUSCULAR | Status: DC | PRN

## 2020-02-22 MED ORDER — ROCURONIUM BROMIDE 10 MG/ML (PF) SYRINGE
PREFILLED_SYRINGE | INTRAVENOUS | Status: DC | PRN
  Administered 2020-02-22: 40 mg via INTRAVENOUS

## 2020-02-22 MED ORDER — STERILE WATER FOR IRRIGATION IR SOLN
Status: DC | PRN
  Administered 2020-02-22: 3000 mL

## 2020-02-22 MED ORDER — SUCCINYLCHOLINE CHLORIDE 200 MG/10ML IV SOSY
PREFILLED_SYRINGE | INTRAVENOUS | Status: DC | PRN
  Administered 2020-02-22: 140 mg via INTRAVENOUS

## 2020-02-22 MED ORDER — LIDOCAINE HCL (CARDIAC) PF 100 MG/5ML IV SOSY
PREFILLED_SYRINGE | INTRAVENOUS | Status: DC | PRN
  Administered 2020-02-22: 80 mg via INTRAVENOUS

## 2020-02-22 MED ORDER — ROCURONIUM BROMIDE 10 MG/ML (PF) SYRINGE
PREFILLED_SYRINGE | INTRAVENOUS | Status: AC
  Filled 2020-02-22: qty 10

## 2020-02-22 MED ORDER — ONDANSETRON HCL 4 MG/2ML IJ SOLN
INTRAMUSCULAR | Status: DC | PRN
  Administered 2020-02-22: 4 mg via INTRAVENOUS

## 2020-02-22 MED ORDER — PROPOFOL 10 MG/ML IV BOLUS
INTRAVENOUS | Status: DC | PRN
  Administered 2020-02-22: 170 mg via INTRAVENOUS

## 2020-02-22 SURGICAL SUPPLY — 45 items
BAG URINE DRAIN 2000ML AR STRL (UROLOGICAL SUPPLIES) ×2 IMPLANT
BLADE EXTENDED COATED 6.5IN (ELECTRODE) IMPLANT
CATH FOLEY 2WAY SLVR 30CC 24FR (CATHETERS) IMPLANT
COVER WAND RF STERILE (DRAPES) IMPLANT
DISSECTOR ROUND CHERRY 3/8 STR (MISCELLANEOUS) IMPLANT
DRAIN CHANNEL RND F F (WOUND CARE) IMPLANT
DRAPE LAPAROSCOPIC ABDOMINAL (DRAPES) IMPLANT
DRAPE LINGEMAN PERC (DRAPES) ×2 IMPLANT
DRAPE WARM FLUID 44X44 (DRAPES) IMPLANT
EVACUATOR SILICONE 100CC (DRAIN) IMPLANT
GAUZE 4X4 16PLY RFD (DISPOSABLE) ×2 IMPLANT
GLOVE BIOGEL M STRL SZ7.5 (GLOVE) ×2 IMPLANT
GOWN STRL REUS W/TWL LRG LVL3 (GOWN DISPOSABLE) ×6 IMPLANT
HEMOSTAT SURGICEL 2X14 (HEMOSTASIS) IMPLANT
KIT TURNOVER KIT A (KITS) IMPLANT
LOOP VESSEL MAXI BLUE (MISCELLANEOUS) IMPLANT
NS IRRIG 1000ML POUR BTL (IV SOLUTION) IMPLANT
PLUG CATH AND CAP STER (CATHETERS) IMPLANT
RELOAD LINEAR CUT PROX 55 BLUE (ENDOMECHANICALS) IMPLANT
RELOAD PROXIMATE 75MM BLUE (ENDOMECHANICALS) IMPLANT
SET IRRIG Y TYPE TUR BLADDER L (SET/KITS/TRAYS/PACK) ×2 IMPLANT
SHEET LAVH (DRAPES) IMPLANT
SOL PREP PROV IODINE SCRUB 4OZ (MISCELLANEOUS) ×2 IMPLANT
SPONGE LAP 18X18 RF (DISPOSABLE) IMPLANT
STAPLER GUN LINEAR PROX 60 (STAPLE) IMPLANT
STAPLER PROXIMATE 55 BLUE (STAPLE) IMPLANT
STAPLER PROXIMATE 75MM BLUE (STAPLE) IMPLANT
STAPLER VISISTAT 35W (STAPLE) IMPLANT
STENT SET URETHERAL LEFT 7FR (STENTS) IMPLANT
SURGILUBE 2OZ TUBE FLIPTOP (MISCELLANEOUS) ×2 IMPLANT
SUT ETHILON 3 0 PS 1 (SUTURE) IMPLANT
SUT SILK 0 (SUTURE)
SUT SILK 0 30XBRD TIE 6 (SUTURE) IMPLANT
SUT SILK 3 0 SH CR/8 (SUTURE) IMPLANT
SUT VIC AB 2-0 SH 27 (SUTURE)
SUT VIC AB 2-0 SH 27X BRD (SUTURE) IMPLANT
SUT VIC AB 3-0 SH 18 (SUTURE) IMPLANT
SUT VIC AB 3-0 SH 27 (SUTURE) ×2
SUT VIC AB 3-0 SH 27X BRD (SUTURE) ×1 IMPLANT
SUT VIC AB 4-0 RB1 27 (SUTURE)
SUT VIC AB 4-0 RB1 27XBRD (SUTURE) IMPLANT
SYR 10ML LL (SYRINGE) IMPLANT
SYR 20ML LL LF (SYRINGE) IMPLANT
SYSTEM UROSTOMY GENTLE TOUCH (WOUND CARE) ×4 IMPLANT
TOWEL OR 17X26 10 PK STRL BLUE (TOWEL DISPOSABLE) ×2 IMPLANT

## 2020-02-22 NOTE — Progress Notes (Signed)
Pt mentioned numbness around upper thighs/ lower abdominal area close to perineal area. Patient can feel pressure but not light sensation to touch.

## 2020-02-22 NOTE — Progress Notes (Addendum)
2 Days Post-Op Subjective: Feeling worse this morning, didn't get much sleep and had nausea, emesis overnight Pain controlled No flatus, BM  Good UOP, blood tinged Stoma remains dusky, slightly improved this am  Objective: Vital signs in last 24 hours: Temp:  [97.7 F (36.5 C)-98.9 F (37.2 C)] 98.9 F (37.2 C) (12/03 0616) Pulse Rate:  [86-109] 109 (12/03 0616) Resp:  [15-18] 18 (12/03 0616) BP: (88-149)/(63-87) 139/79 (12/03 0616) SpO2:  [92 %-98 %] 92 % (12/03 0616)  Intake/Output from previous day: 12/02 0701 - 12/03 0700 In: 2773.5 [P.O.:740; I.V.:1533.5; IV Piggyback:500] Out: 3202 [Urine:2675; Drains:365] Intake/Output this shift: No intake/output data recorded.  Physical Exam:  General: Alert and oriented CV: RRR Lungs: Clear Abdomen: Soft, ND Incisions: c/d/i, stoma in place, dusky. Ostomy device and bilateral stents in place, blood tinged urine output Ext: NT, No erythema  Lab Results: Recent Labs    02/19/20 1719 02/20/20 1729 02/21/20 0426  HGB 12.8 11.7* 11.1*  HCT 40.4 37.1 36.1   BMET Recent Labs    02/19/20 1719 02/21/20 0426  NA 139 139  K 4.1 5.4*  CL 104 108  CO2 25 24  GLUCOSE 99 123*  BUN 17 20  CREATININE 0.98 1.26*  CALCIUM 9.1 8.3*     Studies/Results: No results found.  Assessment/Plan: 12 yoF POD#2 s/p cystectomy and IC with Dr. Tresa Moore on 12/1. Given concern for poor blood supply to distal stoma, will plan for endoscopy to visualize inner and proximal portions of stoma later today.  - continue pain control with scheduled tylenol, oxy/morphine prn - encourage deep breathing - NPO for possible procedure today - continue mIVF - daily lovenox - PT eval - ostomy teaching - pathology pending on bladder specimen    LOS: 3 days   [facility] 02/22/2020, 7:38 AM   Proceed with endoscopy of conduit to r/o significant proximal ishcmic changes. If anything more proximal than distal 1-2 cm relt to be ischemie, then will  perform pull through v. Total revision of conduit. Pt understands goals of procedure, risks, benefits, alternatives, and peri-procedure course.

## 2020-02-22 NOTE — Plan of Care (Signed)
  Problem: Education: Goal: Knowledge of General Education information will improve Description: Including pain rating scale, medication(s)/side effects and non-pharmacologic comfort measures Outcome: Progressing   Problem: Clinical Measurements: Goal: Cardiovascular complication will be avoided Outcome: Progressing   Problem: Coping: Goal: Level of anxiety will decrease Outcome: Progressing   Problem: Elimination: Goal: Will not experience complications related to urinary retention Outcome: Progressing   Problem: Pain Managment: Goal: General experience of comfort will improve Outcome: Progressing   Problem: Safety: Goal: Ability to remain free from injury will improve Outcome: Progressing   Problem: Skin Integrity: Goal: Risk for impaired skin integrity will decrease Outcome: Progressing

## 2020-02-22 NOTE — Transfer of Care (Signed)
Immediate Anesthesia Transfer of Care Note  Patient: Susan Snyder  Procedure(s) Performed: ENDOSCOPY OF ILEAL CONDUIT (N/A )  Patient Location: PACU  Anesthesia Type:General  Level of Consciousness: awake, alert , oriented and patient cooperative  Airway & Oxygen Therapy: Patient Spontanous Breathing and Patient connected to face mask oxygen  Post-op Assessment: Report given to RN and Post -op Vital signs reviewed and stable  Post vital signs: Reviewed and stable  Last Vitals:  Vitals Value Taken Time  BP 145/73 1549  Temp    Pulse 107   Resp    SpO2 91%     Last Pain:  Vitals:   02/22/20 1245  TempSrc: Oral  PainSc:       Patients Stated Pain Goal: 3 (67/01/10 0349)  Complications: No complications documented.

## 2020-02-22 NOTE — Consult Note (Addendum)
[place] Nurse ostomy follow up Pt had pouch change and teaching session performed yesterday by [facility] team member. She declines another pouch change demonstration and teaching session today; states her pouch leaked last night and the staff nurse changed it, and the Urology surgery team has assessed the dark stoma and plans for a procedure to be performed today.  Progress notes indicate; "Given concern for poor blood supply to distal stoma, will plan for endoscopy to visualize inner and proximal portions of stoma later today." Current pouch is intact with good seal and mod amt bloody urine, 2 stints in place and stoma dusky brown when visualized thorough the pouch. Educational materials and 5 extra sets of barrier rings and pouches left at the bedside for staff nurse use.  Enrolled patient in [place] Start Discharge program: Yes, previously Informed patient the [place] nurses are not in the hospital during the weekend but we will perform another teaching session on Mon.   Julien Girt MSN, RN, [place], [place], [place]

## 2020-02-22 NOTE — Anesthesia Preprocedure Evaluation (Signed)
Anesthesia Evaluation  Patient identified by MRN, date of birth, ID band Patient awake    Reviewed: Allergy & Precautions, NPO status , Patient's Chart, lab work & pertinent test results  Airway Mallampati: II  TM Distance: >3 FB Neck ROM: Full    Dental  (+) Teeth Intact, Dental Advisory Given   Pulmonary former smoker,    breath sounds clear to auscultation       Cardiovascular hypertension,  Rhythm:Regular Rate:Normal     Neuro/Psych    GI/Hepatic   Endo/Other    Renal/GU      Musculoskeletal   Abdominal   Peds  Hematology   Anesthesia Other Findings   Reproductive/Obstetrics                             Anesthesia Physical Anesthesia Plan  ASA: III  Anesthesia Plan: General   Post-op Pain Management:    Induction: Intravenous  PONV Risk Score and Plan: Ondansetron and Dexamethasone  Airway Management Planned: Oral ETT  Additional Equipment:   Intra-op Plan:   Post-operative Plan: Extubation in OR  Informed Consent: I have reviewed the patients History and Physical, chart, labs and discussed the procedure including the risks, benefits and alternatives for the proposed anesthesia with the patient or authorized representative who has indicated his/her understanding and acceptance.     Dental advisory given  Plan Discussed with: CRNA and Anesthesiologist  Anesthesia Plan Comments:         Anesthesia Quick Evaluation

## 2020-02-22 NOTE — Plan of Care (Signed)

## 2020-02-22 NOTE — Brief Op Note (Signed)
02/19/2020 - 02/22/2020  3:38 PM  PATIENT:  Susan Snyder  54 y.o. female  PRE-OPERATIVE DIAGNOSIS:  RULE OUT CONDUIT ISCHEMIA  POST-OPERATIVE DIAGNOSIS:  Stoma ischemia  PROCEDURE:  Procedure(s): ENDOSCOPY OF ILEAL CONDUIT,POSSIBLE REVISION OF ILEAL CONDUIT (N/A) CYSTOSCOPY FLEXIBLE (N/A)  SURGEON:  Surgeon(s) and Role:    * Alexis Frock, MD - Primary  PHYSICIAN ASSISTANT:   ASSISTANTS: Sheliah Mends MD   ANESTHESIA:   general  EBL:  minimal   BLOOD ADMINISTERED:none  DRAINS: Urostomy to gravity, LLQ JP to bulb   LOCAL MEDICATIONS USED:  NONE  SPECIMEN:  No Specimen  DISPOSITION OF SPECIMEN:  N/A  COUNTS:  YES  TOURNIQUET:  * No tourniquets in log *  DICTATION: .Other Dictation: Dictation Number  [id number]  PLAN OF CARE: Admit to inpatient   PATIENT DISPOSITION:  PACU - hemodynamically stable.   Delay start of Pharmacological VTE agent (>24hrs) due to surgical blood loss or risk of bleeding: yes

## 2020-02-22 NOTE — Anesthesia Procedure Notes (Signed)
Procedure Name: Intubation Date/Time: 02/22/2020 3:05 PM Performed by: Raenette Rover, CRNA Pre-anesthesia Checklist: Patient identified, Emergency Drugs available, Suction available and Patient being monitored Patient Re-evaluated:Patient Re-evaluated prior to induction Oxygen Delivery Method: Circle system utilized Preoxygenation: Pre-oxygenation with 100% oxygen Induction Type: IV induction, Rapid sequence and Cricoid Pressure applied Laryngoscope Size: Mac and 3 Grade View: Grade I Tube type: Oral Tube size: 7.0 mm Number of attempts: 1 Airway Equipment and Method: Stylet Placement Confirmation: ETT inserted through vocal cords under direct vision,  positive ETCO2 and breath sounds checked- equal and bilateral Secured at: 22 cm Tube secured with: Tape Dental Injury: Teeth and Oropharynx as per pre-operative assessment

## 2020-02-22 NOTE — Anesthesia Postprocedure Evaluation (Signed)
Anesthesia Post Note  Patient: Susan Snyder  Procedure(s) Performed: ENDOSCOPY OF ILEAL CONDUIT (N/A )     Patient location during evaluation: PACU Anesthesia Type: General Level of consciousness: awake and alert Pain management: pain level controlled Vital Signs Assessment: post-procedure vital signs reviewed and stable Respiratory status: spontaneous breathing, nonlabored ventilation, respiratory function stable and patient connected to nasal cannula oxygen Cardiovascular status: blood pressure returned to baseline and stable Postop Assessment: no apparent nausea or vomiting Anesthetic complications: no   No complications documented.  Last Vitals:  Vitals:   02/22/20 1641 02/22/20 1704  BP:  (!) 145/81  Pulse:  97  Resp:    Temp:  37.2 C  SpO2: 94% 96%    Last Pain:  Vitals:   02/22/20 1704  TempSrc: Oral  PainSc:                  Susan Snyder

## 2020-02-23 ENCOUNTER — Encounter (HOSPITAL_COMMUNITY): Payer: Self-pay | Admitting: Urology

## 2020-02-23 ENCOUNTER — Other Ambulatory Visit: Payer: Self-pay | Admitting: Urology

## 2020-02-23 LAB — TYPE AND SCREEN
ABO/RH(D): A POS
Antibody Screen: NEGATIVE
Unit division: 0
Unit division: 0

## 2020-02-23 LAB — BPAM RBC
Blood Product Expiration Date: 202112192359
Blood Product Expiration Date: 202112192359
Unit Type and Rh: 6200
Unit Type and Rh: 6200

## 2020-02-23 LAB — BASIC METABOLIC PANEL
Anion gap: 10 (ref 5–15)
BUN: 13 mg/dL (ref 6–20)
CO2: 28 mmol/L (ref 22–32)
Calcium: 8.3 mg/dL — ABNORMAL LOW (ref 8.9–10.3)
Chloride: 105 mmol/L (ref 98–111)
Creatinine, Ser: 1.1 mg/dL — ABNORMAL HIGH (ref 0.44–1.00)
GFR, Estimated: 59 mL/min — ABNORMAL LOW (ref >60)
Glucose, Bld: 119 mg/dL — ABNORMAL HIGH (ref 70–99)
Potassium: 3.9 mmol/L (ref 3.5–5.1)
Sodium: 143 mmol/L (ref 135–145)

## 2020-02-23 LAB — CBC
HCT: 30.7 % — ABNORMAL LOW (ref 36.0–46.0)
Hemoglobin: 9.4 g/dL — ABNORMAL LOW (ref 12.0–15.0)
MCH: 29.5 pg (ref 26.0–34.0)
MCHC: 30.6 g/dL (ref 30.0–36.0)
MCV: 96.2 fL (ref 80.0–100.0)
Platelets: 244 10*3/uL (ref 150–400)
RBC: 3.19 MIL/uL — ABNORMAL LOW (ref 3.87–5.11)
RDW: 13.6 % (ref 11.5–15.5)
WBC: 9 10*3/uL (ref 4.0–10.5)
nRBC: 0 % (ref 0.0–0.2)

## 2020-02-23 NOTE — Progress Notes (Signed)
1 Day Post-Op Subjective: Feeling better this am Pain controlled Had flatus and bm overnight Good UOP, blood tinged Stoma dusky but overall improved appearance  Objective: Vital signs in last 24 hours: Temp:  [98.1 F (36.7 C)-99 F (37.2 C)] 98.1 F (36.7 C) (12/04 0511) Pulse Rate:  [88-107] 88 (12/04 0511) Resp:  [15-20] 18 (12/04 0511) BP: (127-150)/(58-81) 127/58 (12/04 0511) SpO2:  [91 %-96 %] 91 % (12/04 0511)  Intake/Output from previous day: 12/03 0701 - 12/04 0700 In: 3201.6 [P.O.:200; I.V.:2751.6; IV Piggyback:250] Out: 57 [Urine:3000; Drains:265; Stool:100] Intake/Output this shift: Total I/O In: -  Out: 550 [Urine:550]  Physical Exam:  General: Alert and oriented CV: RRR Lungs: Clear Abdomen: Soft, ND Incisions: c/d/i, stoma in place, dusky but pinker than prior. Ostomy device and bilateral stents in place, blood tinged urine output Ext: NT, No erythema  Lab Results: Recent Labs    02/21/20 0426 02/22/20 1007 02/23/20 0542  HGB 11.1* 9.8* 9.4*  HCT 36.1 31.0* 30.7*   BMET Recent Labs    02/22/20 1007 02/23/20 0542  NA 141 143  K 4.0 3.9  CL 107 105  CO2 27 28  GLUCOSE 123* 119*  BUN 15 13  CREATININE 1.07* 1.10*  CALCIUM 8.2* 8.3*     Studies/Results: No results found.  Assessment/Plan: 3 yoF POD#3 s/p cystectomy and IC with Dr. Tresa Moore on 12/1. Given concern for poor blood supply to distal stoma, went for endoscopy 12/3 to visualize inner and proximal portions of stoma which showed only a small portion of the conduit, very superficially, was dusky.   - continue pain control with scheduled tylenol, oxy/morphine prn - encourage deep breathing - advance to softs today given ROBF - continue mIVF - daily lovenox - PT eval - ostomy teaching - pathology pending on bladder specimen    LOS: 4 days   Susan Snyder 02/23/2020, 11:50 AM

## 2020-02-23 NOTE — Plan of Care (Addendum)
  Problem: Education: Goal: Knowledge of General Education information will improve Description: Including pain rating scale, medication(s)/side effects and non-pharmacologic comfort measures Outcome: Progressing   Problem: Health Behavior/Discharge Planning: Goal: Ability to manage health-related needs will improve Outcome: Progressing   Problem: Clinical Measurements: Goal: Cardiovascular complication will be avoided Outcome: Progressing   Problem: Coping: Goal: Level of anxiety will decrease Outcome: Progressing   Problem: Elimination: Goal: Will not experience complications related to bowel motility Outcome: Progressing Goal: Will not experience complications related to urinary retention Outcome: Progressing   Problem: Pain Managment: Goal: General experience of comfort will improve Outcome: Progressing   Problem: Safety: Goal: Ability to remain free from injury will improve Outcome: Progressing   Problem: Skin Integrity: Goal: Risk for impaired skin integrity will decrease Outcome: Progressing

## 2020-02-23 NOTE — Op Note (Signed)
NAME: LEZLY, RUMPF MEDICAL RECORD HU:[id number] ACCOUNT [id number] DATE OF BIRTH:1965/12/28 FACILITY: [facility] LOCATION: [facility] PHYSICIAN:Emilianna Barlowe Tresa Moore, MD  OPERATIVE REPORT  DATE OF PROCEDURE:  02/22/2020  PREOPERATIVE DIAGNOSIS:  Rule out conduit ischemia.    POSTOPERATIVE DIAGNOSIS:  Superficial stromal ischemia.  PROCEDURE:   1.  Endoscopy of conduit. 2.  Minor wound revision.  ESTIMATED BLOOD LOSS:  Nil.  MEDICATIONS:  None.  SPECIMENS:  None.  FINDINGS: 1.  Ischemic changes only of the distalmost stomal portion of the conduit, approximately 1.5 cm of proximal conduit without ischemic changes on endoscopy. 2.  Superficial wound dehiscence reapproximated with loose suture.  DRAIN:   1.  Right lower quadrant urostomy to gravity drainage. 2.  Left lower quadrant JP to low wall suction.  INDICATIONS:  This is a very pleasant 54 year old woman who underwent cystectomy with conduit urinary diversion on 02/20/2020 had a relatively uneventful course.  She does have significant obesity, especially subcutaneous making inherent lengthy tunnel  of the conduit to the level of the skin.  Postoperative day 1, there were noted to be some ischemic changes of the stoma.  The patient remained systemically well.  We discussed possible distal versus total ischemia of the segment and recommended path of  endoscopy to rule out pan-ischemia with recommendation for revision if found versus observation of only distalmost.  The patient wished to proceed.  Informed consent was obtained and placed in medical record.  PROCEDURE IN DETAIL:  The patient being Susan Snyder, procedure being endoscopy of conduit versus revision of conduit was confirmed.  Procedure timeout was performed.  Intravenous antibiotics administered.  General endotracheal anesthesia induced.  The  patient was placed in supine position.  Sterile field was created, prepping and draping the patient's entire abdomen using iodine including her  in situ ileal conduit on the right side and a percutaneous type dressing was applied over the ostomy of the  conduit.  Next endoscopy of conduit was performed using a 16-French flexible cystoscope, very careful panendoscopic examination was performed of the entire length of the conduit.  There were significant ischemic changes of the distalmost area or just the  stoma alone; however, the bowel was completely pink with no significant changes whatsoever at approximately 1 cm proximal to the level of the skin.  A panendoscopic examination of the remainder conduit including visualization of the ureteroenteric  anastomotic segments and the body and also was completely viable and without ischemic changes.  Given the known significant tunneling already that exists with this conduit, it was felt that the most prudent means of management would be only observation,  understanding that likely the stomal portion [place] necrose somewhat and the formation of the stoma [place] wind up being somewhat eluded; however, it was felt to be less dangerous from the idea of performing a pull-through.  Performing a revision would  actually [place] endanger the proximal portion of the conduit.  The patient also noted to have some very mild superficial wound separation of her previous Monocryl suture line in the midline and it was already prepped into a sterile field purposely loose  approximation was performed of this using horizontal mattress x 3, purposely leaving room in between to allow wound drainage necessary.  A new stoma appliance was applied and the procedure was terminated.  The patient tolerated the procedure well.  No  immediate complications.  The patient was taken to postanesthesia care in stable condition.  Plan for continued inpatient admission, progressive care.  HN/NUANCE  D:02/22/2020 T:02/23/2020  JOB:[id number]

## 2020-02-23 NOTE — Progress Notes (Signed)
Pt had a type 7 stool measuring 100 mL brown and green. Pt stated feeling better after bowel movement.

## 2020-02-24 LAB — CBC
HCT: 30.5 % — ABNORMAL LOW (ref 36.0–46.0)
Hemoglobin: 9.2 g/dL — ABNORMAL LOW (ref 12.0–15.0)
MCH: 29.1 pg (ref 26.0–34.0)
MCHC: 30.2 g/dL (ref 30.0–36.0)
MCV: 96.5 fL (ref 80.0–100.0)
Platelets: 264 10*3/uL (ref 150–400)
RBC: 3.16 MIL/uL — ABNORMAL LOW (ref 3.87–5.11)
RDW: 14 % (ref 11.5–15.5)
WBC: 10.6 10*3/uL — ABNORMAL HIGH (ref 4.0–10.5)
nRBC: 0 % (ref 0.0–0.2)

## 2020-02-24 LAB — BASIC METABOLIC PANEL
Anion gap: 9 (ref 5–15)
BUN: 14 mg/dL (ref 6–20)
CO2: 29 mmol/L (ref 22–32)
Calcium: 8.3 mg/dL — ABNORMAL LOW (ref 8.9–10.3)
Chloride: 107 mmol/L (ref 98–111)
Creatinine, Ser: 1.16 mg/dL — ABNORMAL HIGH (ref 0.44–1.00)
GFR, Estimated: 56 mL/min — ABNORMAL LOW (ref >60)
Glucose, Bld: 91 mg/dL (ref 70–99)
Potassium: 3.2 mmol/L — ABNORMAL LOW (ref 3.5–5.1)
Sodium: 145 mmol/L (ref 135–145)

## 2020-02-24 MED ORDER — ENOXAPARIN SODIUM 60 MG/0.6ML ~~LOC~~ SOLN
50.0000 mg | SUBCUTANEOUS | Status: DC
  Administered 2020-02-25 – 2020-02-26 (×2): 50 mg via SUBCUTANEOUS
  Filled 2020-02-24 (×2): qty 0.6

## 2020-02-24 MED ORDER — ENOXAPARIN SODIUM 40 MG/0.4ML ~~LOC~~ SOLN
40.0000 mg | Freq: Two times a day (BID) | SUBCUTANEOUS | Status: DC
  Administered 2020-02-24: 40 mg via SUBCUTANEOUS
  Filled 2020-02-24: qty 0.4

## 2020-02-24 NOTE — Progress Notes (Signed)
Patient's right arm IV became infiltrated. Right arm noticeably larger than left. IV was removed and replaced in left arm. Patient refuses scheduled reglan doses due to no c/o of nausea. Jerene Pitch

## 2020-02-24 NOTE — Progress Notes (Addendum)
2 Days Post-Op Subjective: Very minimal pain Had several BMs throughout the day yesterday Tolerating softs without nausea or vomiting Endorses continued left anterior thigh numbness, no weakness or pain Good UOP, blood tinged Stoma remains dusky but overall improved appearance  Objective: Vital signs in last 24 hours: Temp:  [98.3 F (36.8 C)-98.7 F (37.1 C)] 98.3 F (36.8 C) (12/05 0537) Pulse Rate:  [87-96] 87 (12/05 0537) Resp:  [17-18] 18 (12/05 0537) BP: (136-149)/(66-75) 139/66 (12/05 0537) SpO2:  [93 %-98 %] 94 % (12/05 0537)  Intake/Output from previous day: 12/04 0701 - 12/05 0700 In: 836.7 [P.O.:360; I.V.:476.7] Out: 2460 [Urine:2000; Drains:460] Intake/Output this shift: No intake/output data recorded.  Physical Exam:  General: Alert and oriented CV: RRR Lungs: Clear Abdomen: Soft, ND Incisions: c/d/i, stoma in place, dusky but pinker than prior. Ostomy device and bilateral stents in place, blood tinged urine output Ext: NT, No erythema Neuro: grossly intact, mild numbness on anterior thigh, strength 5/5 in LE movements  Lab Results: Recent Labs    02/22/20 1007 02/23/20 0542 02/24/20 0407  HGB 9.8* 9.4* 9.2*  HCT 31.0* 30.7* 30.5*   BMET Recent Labs    02/23/20 0542 02/24/20 0407  NA 143 145  K 3.9 3.2*  CL 105 107  CO2 28 29  GLUCOSE 119* 91  BUN 13 14  CREATININE 1.10* 1.16*  CALCIUM 8.3* 8.3*     Studies/Results: No results found.  Assessment/Plan: 45 yoF POD#4 s/p cystectomy and IC with Dr. Tresa Moore on 12/1. Given concern for poor blood supply to distal stoma, went for endoscopy 12/3 to visualize inner and proximal portions of stoma which showed only a small portion of the conduit, very superficially, was dusky.   - continue pain control with scheduled tylenol, oxy/morphine prn - encourage deep breathing and ambulation/OOB - continue softs - continue mIVF until increased PO intake - daily lovenox - PT eval - ostomy teaching -  pathology pending on bladder specimen    LOS: 5 days   Corianna Avallone [facility] 02/24/2020, 8:38 AM

## 2020-02-25 ENCOUNTER — Encounter (HOSPITAL_COMMUNITY): Payer: Self-pay | Admitting: Urology

## 2020-02-25 LAB — BASIC METABOLIC PANEL
Anion gap: 9 (ref 5–15)
BUN: 10 mg/dL (ref 6–20)
CO2: 25 mmol/L (ref 22–32)
Calcium: 8 mg/dL — ABNORMAL LOW (ref 8.9–10.3)
Chloride: 107 mmol/L (ref 98–111)
Creatinine, Ser: 0.94 mg/dL (ref 0.44–1.00)
GFR, Estimated: >60 mL/min (ref >60)
Glucose, Bld: 103 mg/dL — ABNORMAL HIGH (ref 70–99)
Potassium: 3 mmol/L — ABNORMAL LOW (ref 3.5–5.1)
Sodium: 141 mmol/L (ref 135–145)

## 2020-02-25 LAB — CBC
HCT: 30.9 % — ABNORMAL LOW (ref 36.0–46.0)
Hemoglobin: 9.4 g/dL — ABNORMAL LOW (ref 12.0–15.0)
MCH: 29.3 pg (ref 26.0–34.0)
MCHC: 30.4 g/dL (ref 30.0–36.0)
MCV: 96.3 fL (ref 80.0–100.0)
Platelets: 269 10*3/uL (ref 150–400)
RBC: 3.21 MIL/uL — ABNORMAL LOW (ref 3.87–5.11)
RDW: 13.9 % (ref 11.5–15.5)
WBC: 10 10*3/uL (ref 4.0–10.5)
nRBC: 0 % (ref 0.0–0.2)

## 2020-02-25 LAB — SURGICAL PATHOLOGY

## 2020-02-25 LAB — CREATININE, FLUID (PLEURAL, PERITONEAL, JP DRAINAGE): Creat, Fluid: 1 mg/dL

## 2020-02-25 NOTE — TOC Progression Note (Signed)
Transition of Care [facility]) - Progression Note    Patient Details  Name: Susan Snyder MRN: [id number] Date of Birth: 1965/08/10  [facility] CM/SW Contact  Ross Ludwig, [place] Phone Number: 02/25/2020, 2:25 PM  Clinical Narrative:     CSW was able to contact [place], they can accept patient's insurance for [facility] RN.  CSW continuing to follow patient's progress throughout discharge planning.     Expected Discharge Plan and Services    Patient to discharge back home with home health RN through [place].                                             Social Determinants of Health (SDOH) Interventions    Readmission Risk Interventions No flowsheet data found.

## 2020-02-25 NOTE — Progress Notes (Signed)
Physical Therapy Treatment Patient Details Name: Susan Snyder See MRN: [id number] DOB: May 03, 1965 Today's Date: 02/25/2020    History of Present Illness Pt with bladder CA and now s/p Laporoscopic radical cystectomy with bilateral salpingo-oophorectomy and ileal conduit urinary diversion.  Pt with hx of Bil THR, back surgery x 3, and DDD    PT Comments    Pt feeling much better.  Tolerated amb hallway earlier with family and again with PT.  General Gait Details: tolerated an increased distance this session.  Feeling much better.  No dizziness.  Follow Up Recommendations  No PT follow up     Equipment Recommendations  None recommended by PT    Recommendations for Other Services       Precautions / Restrictions Precautions Precautions: Fall Precaution Comments: JP drain on R and ileostomy tube to cather on R  Restrictions Weight Bearing Restrictions: No    Mobility  Bed Mobility               General bed mobility comments: OOB in recliner  Transfers Overall transfer level: Modified independent               General transfer comment: good safety cognition and use of hands to steady self  Ambulation/Gait Ambulation/Gait assistance: Modified independent (Device/Increase time) Gait Distance (Feet): 350 Feet Assistive device: Rolling walker (2 wheeled) Gait Pattern/deviations: Step-through pattern Gait velocity: WNL   General Gait Details: tolerated an increased distance this session.  Feeling much better.  No dizziness.   Stairs             Wheelchair Mobility    Modified Rankin (Stroke Patients Only)       Balance                                            Cognition Arousal/Alertness: Awake/alert Behavior During Therapy: WFL for tasks assessed/performed Overall Cognitive Status: Within Functional Limits for tasks assessed                                 General Comments: AxO x 5 very motivated/pleasant       Exercises      General Comments        Pertinent Vitals/Pain Pain Assessment: Faces Faces Pain Scale: Hurts a little bit Pain Location: abdomen with movement Pain Descriptors / Indicators: Grimacing;Discomfort Pain Intervention(s): Monitored during session    Home Living                      Prior Function            PT Goals (current goals can now be found in the care plan section) Progress towards PT goals: Progressing toward goals    Frequency    Min 3X/week      PT Plan Current plan remains appropriate    Co-evaluation              AM-PAC PT "6 Clicks" Mobility   Outcome Measure  Help needed turning from your back to your side while in a flat bed without using bedrails?: None Help needed moving from lying on your back to sitting on the side of a flat bed without using bedrails?: None Help needed moving to and from a bed to a chair (including a wheelchair)?: None Help  needed standing up from a chair using your arms (e.g., wheelchair or bedside chair)?: None Help needed to walk in hospital room?: None Help needed climbing 3-5 steps with a railing? : A Little 6 Click Score: 23    End of Session Equipment Utilized During Treatment: Gait belt Activity Tolerance: Patient tolerated treatment well Patient left: in chair Nurse Communication: Mobility status PT Visit Diagnosis: Difficulty in walking, not elsewhere classified (R26.2);Pain     Time: 8811-0315 PT Time Calculation (min) (ACUTE ONLY): 20 min  Charges:  $Gait Training: 8-22 mins                     Rica Koyanagi  PTA Acute  Rehabilitation Services Pager      [phone] Office      [phone]

## 2020-02-25 NOTE — Progress Notes (Addendum)
Patient received CHG bath, red, raised areas on skin appeared suddenly. Patient c/o itching and burning on her back. Used a cold wash cloth to sooth the skin and clean off solution. MD notified of reaction. Jerene Pitch

## 2020-02-25 NOTE — Consult Note (Signed)
[place] Nurse ostomy follow up Stoma type/location: RLQ, ileal conduit Stomal assessment/size: 1 1/4" budded, ruddy but not black, sloughing off to pink viable mucosa Peristomal assessment: intact, dips in the peristomal topography at 3 and 9 o'clock  Treatment options for stomal/peristomal skin: patient reports if she pulls abdomen more taunt the barrier ring is not needed, we will not use today and see how this works for the patient Output: bloody urine Ostomy pouching: 1pc.flat cut off center to accommodate wound Education provided:  Explained creation of stoma; presence of sutures  Explained stoma characteristics (budded, flush, color, texture, care) Patient performed pouch change (cutting new barrier, cleaning peristomal skin and stoma) Education on use wick in stoma to keep skin dry with pouch change; verbalized understanding of the need for wick once stents are removed.  Education on emptying when 1/3 to 1/2 full and how to empty; will ambulate without BSD to get more of a feel of the urine filling the pouch and need to empty Education on urine characteristics (sediment, mucous) Demonstrated hooking pouch to nighttime drainage bag; patient independent in this skill 9 1pc flat pouches, barrier rings, extra BSD adapter (2), extra BSD left in room for DC    Enrolled patient in [place] Start Discharge program: Yes 02/25/20 Provided [place] contact information with educational materials   [place] Nurse will follow along with you for continued support with ostomy teaching and care [place] MSN, RN, [place], [place], [place]

## 2020-02-26 LAB — BASIC METABOLIC PANEL
Anion gap: 7 (ref 5–15)
BUN: 10 mg/dL (ref 6–20)
CO2: 28 mmol/L (ref 22–32)
Calcium: 8.2 mg/dL — ABNORMAL LOW (ref 8.9–10.3)
Chloride: 105 mmol/L (ref 98–111)
Creatinine, Ser: 1.13 mg/dL — ABNORMAL HIGH (ref 0.44–1.00)
GFR, Estimated: 57 mL/min — ABNORMAL LOW (ref >60)
Glucose, Bld: 98 mg/dL (ref 70–99)
Potassium: 3.2 mmol/L — ABNORMAL LOW (ref 3.5–5.1)
Sodium: 140 mmol/L (ref 135–145)

## 2020-02-26 LAB — CBC
HCT: 30.3 % — ABNORMAL LOW (ref 36.0–46.0)
Hemoglobin: 9.4 g/dL — ABNORMAL LOW (ref 12.0–15.0)
MCH: 29.5 pg (ref 26.0–34.0)
MCHC: 31 g/dL (ref 30.0–36.0)
MCV: 95 fL (ref 80.0–100.0)
Platelets: 285 10*3/uL (ref 150–400)
RBC: 3.19 MIL/uL — ABNORMAL LOW (ref 3.87–5.11)
RDW: 13.8 % (ref 11.5–15.5)
WBC: 9.7 10*3/uL (ref 4.0–10.5)
nRBC: 0 % (ref 0.0–0.2)

## 2020-02-26 MED ORDER — OXYCODONE-ACETAMINOPHEN 5-325 MG PO TABS: 1.0000 | ORAL_TABLET | Freq: Four times a day (QID) | ORAL | 0 refills | Status: DC | PRN

## 2020-02-26 MED ORDER — DOCUSATE SODIUM 100 MG PO CAPS: 100.0000 mg | ORAL_CAPSULE | Freq: Every day | ORAL | 2 refills | Status: DC | PRN

## 2020-02-26 MED ORDER — SENNA 8.6 MG PO TABS: 1.0000 | ORAL_TABLET | Freq: Every day | ORAL | 0 refills | Status: DC

## 2020-02-26 NOTE — Plan of Care (Signed)
  Problem: Education: Goal: Knowledge of General Education information will improve Description: Including pain rating scale, medication(s)/side effects and non-pharmacologic comfort measures 02/26/2020 1056 by Zadie Rhine, RN Outcome: Progressing 02/26/2020 1056 by Zadie Rhine, RN Outcome: Progressing   Problem: Health Behavior/Discharge Planning: Goal: Ability to manage health-related needs will improve 02/26/2020 1056 by Zadie Rhine, RN Outcome: Progressing 02/26/2020 1056 by Zadie Rhine, RN Outcome: Progressing   Problem: Clinical Measurements: Goal: Ability to maintain clinical measurements within normal limits will improve Outcome: Progressing

## 2020-02-26 NOTE — TOC Transition Note (Addendum)
Transition of Care [facility]) - CM/SW Discharge Note   Patient Details  Name: Susan Snyder MRN: [id number] Date of Birth: June 15, 1965  [facility] of [facility]) CM/SW Contact:  Ross Ludwig, LCSW Phone Number: 02/26/2020, 10:45 AM   Clinical Narrative:   Patient will be going home with home health RN through [facility].  CSW signing off please reconsult with any other social work needs, home health agency has been notified of planned discharge.     Final next level of care: [place] Barriers to Discharge: Barriers Resolved   Patient Goals and CMS Choice Patient states their goals for this hospitalization and ongoing recovery are:: To return back home with home health services. CMS Medicare.gov Compare Post Acute Care list provided to:: Patient Choice offered to / list presented to : Patient  Discharge Placement                       Discharge Plan and Services                    Date DME Agency Contacted: 02/25/20 Time DME Agency Contacted: [phone]   [place]: RN [place] Agency: [place] ([place]) Date [place]: 02/25/20 Time [place]: [place] Representative spoke with at [place]: [place] ([place]) Interventions     Readmission Risk Interventions No flowsheet data found.

## 2020-02-26 NOTE — Discharge Summary (Signed)
Date of admission: 02/19/2020  Date of discharge: 02/26/2020  Admission diagnosis: Bladder Cancer  Discharge diagnosis: Bladder Cancer  History and Physical: For full details, please see admission history and physical. Briefly, Susan Snyder is a 54 y.o. woman with bladder cancer.  After discussing management/treatment options, she elected to proceed with surgical treatment.  Hospital Course: Susan Snyder was taken to the operating room on 02/19/2020 and underwent a robotic assisted laparoscopic radical cystectomy with ileal conduit formation. She tolerated this procedure well and without complications. Postoperatively, she was able to be transferred to a regular hospital room following recovery from anesthesia.  She was able to begin ambulating the day after of surgery. She remained hemodynamically stable throughout her stay.  She had excellent urine output with appropriately minimal output from the pelvic drain. On POD#1 ischemic changes of the superficial portion of the conduit were noted in the setting of significant obesity, especially subcutaneous,  making inherent lengthy tunnel  of the conduit to the level of the skin  and on POD#2 patient was taken to the OR for endoscopy of her conduit which noted only very minimal ischemic changes of the superficial conduit with the very large majority of the conduit remaining pink and healthy, notably at the anastomotic sites. Over the next few days, the patient was transitioned to oral pain medication, had ROBF on POD#4 and tolerated a regular  diet, had ambulated without difficulty and had learned how to care for her ostomy. She met all discharge criteria and was able to be discharged home later on POD#7.  Laboratory values: Recent Labs    02/24/20 0407 02/25/20 0425 02/26/20 0448  HGB 9.2* 9.4* 9.4*  HCT 30.5* 30.9* 30.3*    Disposition: Home  Discharge instruction: He was instructed to be ambulatory but to refrain from heavy lifting, strenuous  activity, or driving. He was instructed on urethral catheter care.  Discharge medications:   Allergies as of 02/26/2020      Reactions   Adhesive [tape] Other (See Comments)   Blisters   Gabapentin Nausea And Vomiting   Hydrocodone-acetaminophen Nausea And Vomiting   hallucinations   Keflex [cephalexin] Shortness Of Breath, Palpitations   Sulfa Antibiotics Shortness Of Breath, Palpitations   Penicillins Rash   Did it involve swelling of the face/tongue/throat, SOB, or low BP? No Did it involve sudden or severe rash/hives, skin peeling, or any reaction on the inside of your mouth or nose? No Did you need to seek medical attention at a hospital or doctor's office? No When did it last happen?Childhood If all above answers are "NO", [facility] proceed with cephalosporin use.      Medication List    TAKE these medications   aspirin EC 81 MG tablet Take 1 tablet (81 mg total) by mouth daily.   atorvastatin 10 MG tablet Commonly known as: LIPITOR TAKE 1 TABLET BY MOUTH EVERYDAY AT BEDTIME What changed: See the new instructions.   baclofen 10 MG tablet Commonly known as: LIORESAL Take 0.5-1 tablets (5-10 mg total) by mouth 3 (three) times daily as needed for muscle spasms.   budesonide 3 MG 24 hr capsule Commonly known as: ENTOCORT EC Take 3 capsules (9 mg total) by mouth daily. What changed:   how much to take  when to take this   buPROPion 150 MG 24 hr tablet Commonly known as: WELLBUTRIN XL Take 1 tablet (150 mg total) by mouth in the morning and at bedtime. What changed: when to take this  calcium carbonate 500 MG chewable tablet Commonly known as: TUMS - dosed in mg elemental calcium Chew 1 tablet by mouth daily as needed for indigestion or heartburn.   docusate sodium 100 MG capsule Commonly known as: Colace Take 1 capsule (100 mg total) by mouth daily as needed.   metoprolol succinate 25 MG 24 hr tablet Commonly known as: TOPROL-XL Take 1 tablet (25 mg total)  by mouth daily with breakfast. What changed: when to take this   oxybutynin 10 MG 24 hr tablet Commonly known as: Ditropan XL Take 1 tablet (10 mg total) by mouth at bedtime.   oxyCODONE-acetaminophen 5-325 MG tablet Commonly known as: Percocet Take 1 tablet by mouth every 6 (six) hours as needed for moderate pain or severe pain. Post-operatively What changed:   how much to take  when to take this  additional instructions   pregabalin 25 MG capsule Commonly known as: Lyrica Take 1 capsule (25 mg total) by mouth 2 (two) times daily.   senna 8.6 MG Tabs tablet Commonly known as: SENOKOT Take 1 tablet (8.6 mg total) by mouth daily.   Vitamin B-12 5000 MCG Tbdp Take 5,000 mcg by mouth daily.   Vitamin D (Cholecalciferol) 25 MCG (1000 UT) Caps Take 1,000 Units by mouth daily.       Followup: She will followup in 2weeks for bander stent removal and to discuss surgical pathology results.

## 2020-02-26 NOTE — Progress Notes (Signed)
Pt discharge to home, instructions with pt, acknowledged understanding. Pt resting without distress. Teaching completed and questions addressed with pt and daughter. Pt aware [facility] follow with pt at home. SRP, RN

## 2020-03-15 ENCOUNTER — Emergency Department (HOSPITAL_COMMUNITY): Payer: 59

## 2020-03-15 ENCOUNTER — Other Ambulatory Visit: Payer: Self-pay

## 2020-03-15 ENCOUNTER — Emergency Department (HOSPITAL_COMMUNITY)
Admission: EM | Admit: 2020-03-15 | Discharge: 2020-03-15 | Disposition: A | Discharge status: Home / Self Care | Payer: 59 | Attending: Emergency Medicine | Admitting: Emergency Medicine

## 2020-03-15 ENCOUNTER — Encounter (HOSPITAL_COMMUNITY): Payer: Self-pay | Admitting: *Deleted

## 2020-03-15 DIAGNOSIS — Z20822 Contact with and (suspected) exposure to covid-19: Secondary | ICD-10-CM | POA: Diagnosis not present

## 2020-03-15 DIAGNOSIS — R112 Nausea with vomiting, unspecified: Secondary | ICD-10-CM | POA: Diagnosis not present

## 2020-03-15 DIAGNOSIS — R1084 Generalized abdominal pain: Secondary | ICD-10-CM | POA: Diagnosis not present

## 2020-03-15 DIAGNOSIS — K219 Gastro-esophageal reflux disease without esophagitis: Secondary | ICD-10-CM | POA: Insufficient documentation

## 2020-03-15 DIAGNOSIS — Z79899 Other long term (current) drug therapy: Secondary | ICD-10-CM | POA: Diagnosis not present

## 2020-03-15 DIAGNOSIS — Z7982 Long term (current) use of aspirin: Secondary | ICD-10-CM | POA: Diagnosis not present

## 2020-03-15 DIAGNOSIS — Z87891 Personal history of nicotine dependence: Secondary | ICD-10-CM | POA: Insufficient documentation

## 2020-03-15 DIAGNOSIS — Z85828 Personal history of other malignant neoplasm of skin: Secondary | ICD-10-CM | POA: Diagnosis not present

## 2020-03-15 DIAGNOSIS — Z96643 Presence of artificial hip joint, bilateral: Secondary | ICD-10-CM | POA: Insufficient documentation

## 2020-03-15 DIAGNOSIS — Z8603 Personal history of neoplasm of uncertain behavior: Secondary | ICD-10-CM | POA: Diagnosis not present

## 2020-03-15 LAB — URINALYSIS, ROUTINE W REFLEX MICROSCOPIC
Bilirubin Urine: NEGATIVE
Glucose, UA: NEGATIVE mg/dL
Ketones, ur: 5 mg/dL — AB
Nitrite: NEGATIVE
Protein, ur: 100 mg/dL — AB
RBC / HPF: >50 RBC/hpf — ABNORMAL HIGH (ref 0–5)
Specific Gravity, Urine: 1.018 (ref 1.005–1.030)
WBC, UA: >50 WBC/hpf — ABNORMAL HIGH (ref 0–5)
pH: 5 (ref 5.0–8.0)

## 2020-03-15 LAB — COMPREHENSIVE METABOLIC PANEL
ALT: 23 U/L (ref 0–44)
AST: 21 U/L (ref 15–41)
Albumin: 3.5 g/dL (ref 3.5–5.0)
Alkaline Phosphatase: 73 U/L (ref 38–126)
Anion gap: 10 (ref 5–15)
BUN: 17 mg/dL (ref 6–20)
CO2: 23 mmol/L (ref 22–32)
Calcium: 8.7 mg/dL — ABNORMAL LOW (ref 8.9–10.3)
Chloride: 106 mmol/L (ref 98–111)
Creatinine, Ser: 1.88 mg/dL — ABNORMAL HIGH (ref 0.44–1.00)
GFR, Estimated: 31 mL/min — ABNORMAL LOW (ref >60)
Glucose, Bld: 125 mg/dL — ABNORMAL HIGH (ref 70–99)
Potassium: 3.4 mmol/L — ABNORMAL LOW (ref 3.5–5.1)
Sodium: 139 mmol/L (ref 135–145)
Total Bilirubin: 0.6 mg/dL (ref 0.3–1.2)
Total Protein: 6.6 g/dL (ref 6.5–8.1)

## 2020-03-15 LAB — RESP PANEL BY RT-PCR (FLU A&B, COVID) ARPGX2
Influenza A by PCR: NEGATIVE
Influenza B by PCR: NEGATIVE
SARS Coronavirus 2 by RT PCR: NEGATIVE

## 2020-03-15 LAB — CBC
HCT: 33.4 % — ABNORMAL LOW (ref 36.0–46.0)
Hemoglobin: 10.5 g/dL — ABNORMAL LOW (ref 12.0–15.0)
MCH: 28.8 pg (ref 26.0–34.0)
MCHC: 31.4 g/dL (ref 30.0–36.0)
MCV: 91.5 fL (ref 80.0–100.0)
Platelets: 382 10*3/uL (ref 150–400)
RBC: 3.65 MIL/uL — ABNORMAL LOW (ref 3.87–5.11)
RDW: 13.3 % (ref 11.5–15.5)
WBC: 11.7 10*3/uL — ABNORMAL HIGH (ref 4.0–10.5)
nRBC: 0 % (ref 0.0–0.2)

## 2020-03-15 LAB — LIPASE, BLOOD: Lipase: 26 U/L (ref 11–51)

## 2020-03-15 LAB — LACTIC ACID, PLASMA: Lactic Acid, Venous: 1.5 mmol/L (ref 0.5–1.9)

## 2020-03-15 MED ORDER — FENTANYL CITRATE (PF) 100 MCG/2ML IJ SOLN
50.0000 ug | Freq: Once | INTRAMUSCULAR | Status: AC
  Administered 2020-03-15: 50 ug via INTRAVENOUS
  Filled 2020-03-15: qty 2

## 2020-03-15 MED ORDER — OXYCODONE-ACETAMINOPHEN 5-325 MG PO TABS: 1.0000 | ORAL_TABLET | ORAL | 0 refills | Status: DC | PRN

## 2020-03-15 MED ORDER — SODIUM CHLORIDE 0.9 % IV SOLN: 1.0000 g | Freq: Three times a day (TID) | INTRAVENOUS | Status: DC

## 2020-03-15 MED ORDER — SODIUM CHLORIDE 0.9 % IV BOLUS
1000.0000 mL | Freq: Once | INTRAVENOUS | Status: AC
  Administered 2020-03-15: 1000 mL via INTRAVENOUS

## 2020-03-15 MED ORDER — HYDROMORPHONE HCL 1 MG/ML IJ SOLN
1.0000 mg | Freq: Once | INTRAMUSCULAR | Status: AC
Start: 2020-03-15 — End: 2020-03-15
  Administered 2020-03-15: 1 mg via INTRAVENOUS
  Filled 2020-03-15: qty 1

## 2020-03-15 MED ORDER — METRONIDAZOLE IN NACL 5-0.79 MG/ML-% IV SOLN
500.0000 mg | Freq: Once | INTRAVENOUS | Status: AC
  Administered 2020-03-15: 500 mg via INTRAVENOUS
  Filled 2020-03-15: qty 100

## 2020-03-15 MED ORDER — HALOPERIDOL LACTATE 5 MG/ML IJ SOLN
1.0000 mg | Freq: Once | INTRAMUSCULAR | Status: AC
  Administered 2020-03-15: 1 mg via INTRAVENOUS
  Filled 2020-03-15: qty 1

## 2020-03-15 MED ORDER — ONDANSETRON HCL 4 MG/2ML IJ SOLN
4.0000 mg | Freq: Once | INTRAMUSCULAR | Status: AC | PRN
  Administered 2020-03-15: 4 mg via INTRAVENOUS
  Filled 2020-03-15: qty 2

## 2020-03-15 MED ORDER — SODIUM CHLORIDE 0.9 % IV SOLN: 2.0000 g | Freq: Once | INTRAVENOUS | Status: DC

## 2020-03-15 MED ORDER — VANCOMYCIN HCL 2000 MG/400ML IV SOLN
2000.0000 mg | INTRAVENOUS | Status: AC
  Administered 2020-03-15: 2000 mg via INTRAVENOUS
  Filled 2020-03-15: qty 400

## 2020-03-15 MED ORDER — ONDANSETRON 4 MG PO TBDP: 4.0000 mg | ORAL_TABLET | Freq: Three times a day (TID) | ORAL | 0 refills | Status: DC | PRN

## 2020-03-15 MED ORDER — CLINDAMYCIN PHOSPHATE 900 MG/50ML IV SOLN
900.0000 mg | Freq: Once | INTRAVENOUS | Status: DC
  Filled 2020-03-15: qty 50

## 2020-03-15 MED ORDER — HYDROMORPHONE HCL 1 MG/ML IJ SOLN
1.0000 mg | Freq: Once | INTRAMUSCULAR | Status: AC
  Administered 2020-03-15: 1 mg via INTRAVENOUS
  Filled 2020-03-15: qty 1

## 2020-03-15 NOTE — ED Provider Notes (Signed)
[place] DEPT Provider Note   CSN: [id number] Arrival date & time: 03/15/20  1315     History Chief Complaint  Patient presents with  . Abdominal Pain  . Emesis    Susan Snyder is a 54 y.o. female.  HPI 54 year old female with a history of anemia, cancer, chronic calculus cholecystitis, GERD, kidney stones, hypertension with recent urostomy placement on 12/3 by Dr. Tresa Moore presents to the ER with generalized abdominal discomfort and imaging.  Patient states that she started to feel unwell yesterday, and started to develop nausea and nonbloody nonbilious vomiting today.  Has felt weak and tired.  States that her pain started in the left lower quadrant but has now traveled throughout her abdomen.  She has not noticed any redness or swelling to the site of the urostomy, which is on the right side of her abdomen.  States that she has felt chilled but no measurable fevers.  Denies any diarrhea.  No cough, nasal congestion.  Past Medical History:  Diagnosis Date  . Anemia   . Cancer ([place])   . CCC (chronic calculous cholecystitis) 04/03/2019  . Chronic diarrhea   . Chronic gastritis Sept. 2015   on EGD   . Collagenous colitis    colonoscopy biopsies 11/01/13  . Complication of anesthesia   . DDD (degenerative disc disease), cervical   . GERD (gastroesophageal reflux disease)   . History of kidney stones   . Hyperplastic polyps of stomach    Aug 2015  . Hypertension   . Neoplasm of uncertain behavior of skin 05/20/2016   Hx of skin cancer, BCC  . PONV (postoperative nausea and vomiting)    only with her hysterectomy    Patient Active Problem List   Diagnosis Date Noted  . Bladder cancer ([place]) 02/20/2020  . Malignant neoplasm of urinary bladder ([place]) 12/20/2019  . Mixed hyperlipidemia 12/20/2019  . History of recurrent UTIs 08/01/2019  . Major depressive disorder with current active episode 08/01/2019  . Status post laparoscopic cholecystectomy  04/19/2019  . Status post laparoscopic appendectomy 04/03/2019  . Neoplasm of uncertain behavior of skin 05/20/2016  . 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 Vitamin B12 deficiency 01/14/2015  . Vitamin D deficiency 01/14/2015  . Obesity 01/14/2015  . Hx of iron deficiency anemia 01/14/2015  . Frequent headaches 12/28/2014  . Abnormal finding on MRI of brain 12/28/2014  . Tobacco abuse 12/23/2014  . Stress reaction 12/23/2014  . Hypertension   . Renal cyst   . Collagenous colitis   . H/O bilateral hip replacements 06/28/2014  . Malabsorption due to intolerance, not elsewhere classified 01/02/2014  . Primary localized osteoarthrosis, pelvic region and thigh 10/25/2013  . Lumbar disc herniation with radiculopathy 05/22/2013  . Cervical spondylosis 02/06/2013  . Intervertebral cervical disc disorder with myelopathy, cervical region 02/06/2013    Past Surgical History:  Procedure Laterality Date  . ABDOMINAL HYSTERECTOMY  2005   partial, still has ovaries, due to fibroids.  . APPENDECTOMY    . [place]   x 4  . CHOLECYSTECTOMY N/A 04/11/2019   Procedure: LAPAROSCOPIC CHOLECYSTECTOMY;  Surgeon: Ronny Bacon, MD;  Location: [facility] ORS;  Service: General;  Laterality: N/A;  . COLONOSCOPY W/ BIOPSIES  11/01/13   collagenous colitis, sigmoid polypectomy x 2; hyperplastic polyps  . CYSTECTOMY N/A 02/22/2020   Procedure: ENDOSCOPY OF ILEAL CONDUIT;  Surgeon: Alexis Frock, MD;  Location: [facility];  Service: Urology;  Laterality: N/A;  . CYSTOSCOPY WITH BIOPSY  N/A 11/19/2019   Procedure: CYSTOSCOPY WITH BIOPSY;  Surgeon: [name], MD;  Location: [facility] ORS;  Service: Urology;  Laterality: N/A;  . CYSTOSCOPY WITH INJECTION N/A 02/20/2020   Procedure: CYSTOSCOPY WITH INJECTION OF INDOCYANINE GREEN DYE;  Surgeon: Alexis Frock, MD;  Location: [facility];  Service: Urology;  Laterality: N/A;  . ESOPHAGOGASTRODUODENOSCOPY  11/22/13   chronic gastritis  . HIP ARTHROPLASTY  Bilateral 01/15/14,03/05/14   total hip, Dr. Nilsa Nutting  . LAPAROSCOPIC APPENDECTOMY N/A 08/20/2018   Procedure: APPENDECTOMY LAPAROSCOPIC;  Surgeon: Fredirick Maudlin, MD;  Location: [facility] OR[facility];  Service: General;  Laterality: N/A;  . LYMPHADENECTOMY Bilateral 02/20/2020   Procedure: Noel Journey;  Surgeon: Alexis Frock, MD;  Location: [facility];  Service: Urology;  Laterality: Bilateral;  . NECK SURGERY  2015  . ROBOT ASSISTED LAPAROSCOPIC COMPLETE CYSTECT ILEAL CONDUIT N/A 02/20/2020   Procedure: XI ROBOTIC ASSISTED LAPAROSCOPIC COMPLETE CYSTECT ILEAL CONDUIT AND BILATERAL OOPHORECTOMY;  Surgeon: Alexis Frock, MD;  Location: [facility];  Service: Urology;  Laterality: N/A;  6 HRS  . TRANSURETHRAL RESECTION OF BLADDER TUMOR WITH MITOMYCIN-C N/A 10/29/2019   Procedure: TRANSURETHRAL RESECTION OF BLADDER TUMOR;  Surgeon: [name], MD;  Location: [facility];  Service: Urology;  Laterality: N/A;  . TRANSURETHRAL RESECTION OF BLADDER TUMOR WITH MITOMYCIN-C N/A 11/19/2019   Procedure: TRANSURETHRAL RESECTION OF BLADDER TUMOR WITH Gemcitabine;  Surgeon: [name], MD;  Location: [facility];  Service: Urology;  Laterality: N/A;     OB History   No obstetric history on file.     Family History  Problem Relation Age of Onset  . Stroke Mother   . Glaucoma Mother   . Heart disease Father   . Cancer Father        prostate  . Heart disease Sister   . Diabetes Sister   . Diabetes Paternal Aunt   . Heart disease Paternal Aunt   . Bleeding Disorder Paternal Uncle   . Heart disease Sister        Psychologist, forensic  . Diabetes Paternal Aunt   . Heart disease Paternal Aunt     Social History   Tobacco Use  . Smoking status: Former Smoker    Packs/day: 1.00    Years: 30.00    Pack years: 30.00    Types: Cigarettes    Quit date: 09/22/2019    Years since quitting: 0.4  . Smokeless tobacco: Never Used  Vaping Use  . Vaping Use: Every day  . Substances: Nicotine  Substance Use Topics  .  Alcohol use: Yes    Comment: rare  . Drug use: No    Home Medications Prior to Admission medications   Medication Sig Start Date End Date Taking? Authorizing Provider  acetaminophen (TYLENOL) 500 MG tablet Take 1,000 mg by mouth every 6 (six) hours as needed for mild pain.   Yes [provider]  aspirin EC 81 MG tablet Take 1 tablet (81 mg total) by mouth daily. 04/13/18  Yes Lada, Satira Anis, MD  atorvastatin (LIPITOR) 10 MG tablet TAKE 1 TABLET BY MOUTH EVERYDAY AT BEDTIME Patient taking differently: Take 10 mg by mouth at bedtime. 08/27/19  Yes Delsa Grana, PA-C  buPROPion (WELLBUTRIN XL) 150 MG 24 hr tablet Take 1 tablet (150 mg total) by mouth in the morning and at bedtime. Patient taking differently: Take 150 mg by mouth daily. 12/06/19  Yes Delsa Grana, PA-C  calcium carbonate (TUMS - DOSED IN MG ELEMENTAL CALCIUM) 500 MG chewable tablet Chew 1 tablet by  mouth daily as needed for indigestion or heartburn.    Yes [provider]  docusate sodium (COLACE) 100 MG capsule Take 1 capsule (100 mg total) by mouth daily as needed. 02/26/20 02/25/21 Yes Demzik, Burr Medico, MD  metoprolol succinate (TOPROL-XL) 25 MG 24 hr tablet Take 1 tablet (25 mg total) by mouth daily with breakfast. Patient taking differently: Take 25 mg by mouth at bedtime. 08/29/19  Yes Delsa Grana, PA-C  simethicone (MYLICON) 341 MG chewable tablet Chew 125 mg by mouth every 6 (six) hours as needed for flatulence.   Yes [provider]  Vitamin D, Cholecalciferol, 25 MCG (1000 UT) CAPS Take 1,000 Units by mouth daily.    Yes [provider]  baclofen (LIORESAL) 10 MG tablet Take 0.5-1 tablets (5-10 mg total) by mouth 3 (three) times daily as needed for muscle spasms. Patient not taking: No sig reported 12/25/19   Delsa Grana, PA-C  budesonide (ENTOCORT EC) 3 MG 24 hr capsule Take 3 capsules (9 mg total) by mouth daily. Patient taking differently: Take 3-9 mg by mouth in the morning.  08/29/19    Delsa Grana, PA-C  ondansetron (ZOFRAN ODT) 4 MG disintegrating tablet Take 1 tablet (4 mg total) by mouth every 8 (eight) hours as needed for nausea or vomiting. 03/15/20   Garald Balding, PA-C  oxybutynin (DITROPAN XL) 10 MG 24 hr tablet Take 1 tablet (10 mg total) by mouth at bedtime. Patient not taking: No sig reported 11/19/19   [name], MD  oxyCODONE-acetaminophen (PERCOCET/ROXICET) 5-325 MG tablet Take 1 tablet by mouth every 4 (four) hours as needed for severe pain. 03/15/20   Garald Balding, PA-C  pregabalin (LYRICA) 25 MG capsule Take 1 capsule (25 mg total) by mouth 2 (two) times daily. Patient not taking: No sig reported 12/25/19   Delsa Grana, PA-C  senna (SENOKOT) 8.6 MG TABS tablet Take 1 tablet (8.6 mg total) by mouth daily. Patient not taking: Reported on 03/15/2020 02/26/20   Ermelinda Das, MD    Allergies    Adhesive [tape], Gabapentin, Hydrocodone-acetaminophen, Keflex [cephalexin], Sulfa antibiotics, and Penicillins  Review of Systems   Review of Systems  Constitutional: Negative for chills and fever.  HENT: Negative for ear pain and sore throat.   Eyes: Negative for pain and visual disturbance.  Respiratory: Negative for cough and shortness of breath.   Cardiovascular: Negative for chest pain and palpitations.  Gastrointestinal: Positive for abdominal pain, nausea and vomiting.  Genitourinary: Negative for dysuria and hematuria.  Musculoskeletal: Negative for arthralgias and back pain.  Skin: Negative for color change and rash.  Neurological: Negative for seizures and syncope.  All other systems reviewed and are negative.   Physical Exam Updated Vital Signs BP (!) 161/87   Pulse 100   Temp 98.8 F (37.1 C) (Oral)   Resp 16   Ht 5' 9"  (1.753 m)   Wt 103.4 kg   SpO2 100%   BMI 33.67 kg/m   Physical Exam Vitals and nursing note reviewed.  Constitutional:      General: She is not in acute distress.    Appearance: She is well-developed and  well-nourished. She is obese.     Comments: Uncomfortable appearing  HENT:     Head: Normocephalic and atraumatic.  Eyes:     Conjunctiva/sclera: Conjunctivae normal.  Cardiovascular:     Rate and Rhythm: Normal rate and regular rhythm.     Heart sounds: No murmur heard.   Pulmonary:     Effort:  Pulmonary effort is normal. No respiratory distress.     Breath sounds: Normal breath sounds.  Abdominal:     General: Abdomen is flat. Bowel sounds are normal.     Palpations: Abdomen is soft.     Tenderness: There is abdominal tenderness in the left lower quadrant. There is no right CVA tenderness or left CVA tenderness.     Comments: Mild tenderness to the abdomen in the right upper, left upper and right lower quadrants.  Significant tenderness to palpation in the left lower quadrant, no overlying erythema, redness, no noticeable gas.  No flank tenderness.  No visible drainage, erythema around the urostomy insertion.  Musculoskeletal:        General: No edema.     Cervical back: Neck supple.  Skin:    General: Skin is warm and dry.  Neurological:     Mental Status: She is alert.  Psychiatric:        Mood and Affect: Mood and affect normal.     ED Results / Procedures / Treatments   Labs (all labs ordered are listed, but only abnormal results are displayed) Labs Reviewed  COMPREHENSIVE METABOLIC PANEL - Abnormal; Notable for the following components:      Result Value   Potassium 3.4 (*)    Glucose, Bld 125 (*)    Creatinine, Ser 1.88 (*)    Calcium 8.7 (*)    GFR, Estimated 31 (*)    All other components within normal limits  CBC - Abnormal; Notable for the following components:   WBC 11.7 (*)    RBC 3.65 (*)    Hemoglobin 10.5 (*)    HCT 33.4 (*)    All other components within normal limits  URINALYSIS, ROUTINE W REFLEX MICROSCOPIC - Abnormal; Notable for the following components:   APPearance CLOUDY (*)    Hgb urine dipstick LARGE (*)    Ketones, ur 5 (*)    Protein,  ur 100 (*)    Leukocytes,Ua LARGE (*)    RBC / HPF >50 (*)    WBC, UA >50 (*)    Bacteria, UA MANY (*)    All other components within normal limits  RESP PANEL BY RT-PCR (FLU A&B, COVID) [id number]  URINE CULTURE  CULTURE, BLOOD (ROUTINE X 2)  CULTURE, BLOOD (ROUTINE X 2)  LIPASE, BLOOD  LACTIC ACID, PLASMA    EKG EKG Interpretation  Date/Time:  Saturday March 15 2020 15:08:21 EST Ventricular Rate:  93 PR Interval:    QRS Duration: 90 QT Interval:  359 QTC Calculation: 447 R Axis:   22 Text Interpretation: Sinus rhythm Borderline prolonged PR interval since last tracing no significant change Confirmed by Malvin Johns [phone]) on 03/15/2020 3:12:51 PM   Radiology CT ABDOMEN PELVIS WO CONTRAST  Result Date: 03/15/2020 CLINICAL DATA:  LEFT lower quadrant abdominal pain and vomiting since this morning, syncopal episode of 5 minutes duration; recent cystectomy 02/22/2020 with ileal conduit EXAM: CT ABDOMEN AND PELVIS WITHOUT CONTRAST TECHNIQUE: Multidetector CT imaging of the abdomen and pelvis was performed following the standard protocol without IV contrast. Sagittal and coronal MPR images reconstructed from axial data set. No oral contrast administered COMPARISON:  11/05/2019 FINDINGS: Lower chest: Minimal subsegmental atelectasis LEFT base Hepatobiliary: Gallbladder surgically absent.  Liver unremarkable. Pancreas: Normal appearance Spleen: Normal appearance Adrenals/Urinary Tract: Adrenal glands normal appearance. BILATERAL ureteral stents extending to urinary diversion in the mid abdomen. Minimal LEFT renal collecting system dilatation. No urinary tract calcifications or ureteral dilatation. Post cystectomy. Bladder  bed obscured by beam hardening artifacts from hip prostheses. Stomach/Bowel: Stomach and small bowel loops unremarkable. Colon decompressed. Appendix not identified. Vascular/Lymphatic: Atherosclerotic calcifications aorta without aneurysm. No adenopathy. Reproductive:  Uterus surgically absent by history. Unremarkable LEFT ovary. RIGHT ovary not definitely visualized. Other: Small amount of free fluid in pelvis and adjacent to liver/spleen. No free intraperitoneal air. Extensive gas identified within the subcutaneous soft tissues of the anterior abdominal wall extending to the flanks of the abdomen and pelvis, excessive for 3 weeks post surgery. No definite skin thickening or significant edema/fluid collections. This could represent cellulitis/fasciitis by gas-forming organism, less likely postoperative. This extends to the proximal anterior LEFT thigh. Musculoskeletal: BILATERAL hip prostheses. No acute osseous findings. IMPRESSION: Extensive gas within the subcutaneous soft tissues of the anterior abdominal wall extending to the flanks of the abdomen and pelvis, atypical for 3 weeks post surgery; this could reflect soft tissue infection by a gas-forming organism cellulitis/fasciitis, with atypical postoperative gas considered less likely. Small amount of nonspecific free fluid in pelvis and adjacent to liver/spleen. BILATERAL ureteral stents extending to urinary diversion in the mid abdomen. Minimal LEFT renal collecting system dilatation. Aortic Atherosclerosis (ICD10-I70.0). Findings called to Sharyn Lull PA on 03/15/2020 at 1615 hours. Electronically Signed   By: Lavonia Dana M.D.   On: 03/15/2020 16:18    Procedures Procedures (including critical care time)  Medications Ordered in ED Medications  vancomycin (VANCOREADY) IVPB 2000 mg/400 mL (2,000 mg Intravenous New Bag/Given 03/15/20 1734)  ondansetron (ZOFRAN) injection 4 mg (4 mg Intravenous Given 03/15/20 1415)  sodium chloride 0.9 % bolus 1,000 mL (0 mLs Intravenous Stopped 03/15/20 1702)  fentaNYL (SUBLIMAZE) injection 50 mcg (50 mcg Intravenous Given 03/15/20 1457)  haloperidol lactate (HALDOL) injection 1 mg (1 mg Intravenous Given 03/15/20 1551)  HYDROmorphone (DILAUDID) injection 1 mg (1 mg Intravenous  Given 03/15/20 1658)  metroNIDAZOLE (FLAGYL) IVPB 500 mg (0 mg Intravenous Stopped 03/15/20 1844)  HYDROmorphone (DILAUDID) injection 1 mg (1 mg Intravenous Given 03/15/20 1903)    ED Course  I have reviewed the triage vital signs and the nursing notes.  Pertinent labs & imaging results that were available during my care of the patient were reviewed by me and considered in my medical decision making (see chart for details).  Clinical Course as of 03/15/20 1923  Sat Mar 15, 2020  1748 Dr. Abner Greenspan  [MB]    Clinical Course User Index [MB] Lyndel Safe   MDM Rules/Calculators/A&P                          54 year old female with complaints of nausea and vomiting and abdominal pain.  Recent urostomy tube placed about 3 weeks ago.  Vitals on arrival with some mild hypertension, however afebrile, mildly tachycardic with a rate of 101, not tachypneic or hypoxic.  Physical exam with significant left lower quadrant tenderness, however no noticeable erythema, fluctuance, crepitus noted.  No flank tenderness.  Labs ordered, reviewed and interpreted by me -CBC with mild leukocytosis of 11.7, hemoglobin 11 of 10.5 which appears to be improved from previous -CMP without any significant electrode abnormalities, though mild hypokalemia noted 3.4.  Likely secondary to vomiting.  She does have an elevated creatinine 1.88, likely secondary to dehydration.  GFR 31 -UA with large amount of hemoglobin, large leukocytes, more than 50 WBCs.  This was collected from the urostomy bag so I believe that this is not a clean specimen.  Urine was sent for  culture. -Lactic acid normal -Blood culture pending  Imaging ordered, reviewed and interpreted by myself and radiology -CT of the abdomen had to be performed without contrast given patient's borderline GFR.  There was noted to be extensive gas in the subcutaneous soft tissues of the anterior abdominal wall extending to the flanks of the abdomen and pelvis, which  was thought to be atypical for 3 weeks postop.  There was concern for possible soft tissue infection by gas-forming organism cellulitis/fasciitis.  There was also minimal left renal collecting system dilatation.  MDM: Patient was treated with IV fluids, pain medicine and Zofran.  Given concerning CT scan findings, consulted general surgery.  They recommended follow-up with urology given recent postop case.  Initiated antibiotic regimen with vancomycin, Flagyl, per discussion with pharmacy and patient's history of anaphylactic penicillin allergy and cephalosporin allergy, initiated clindamycin and aztreonam for gram-negative coverage.  Spoke with Dr. Abner Greenspan with urology who came and saw the patient.  He feels as though this is not a necrotizing fasciitis and or a cellulitic infection, but rather postop gas.  He states that this gas takes quite a while to clear.  He thinks this is what is contributing to her symptoms.  He is comfortable with sending her home with Zofran and a short course of pain medicines.  He states he will follow up in the office.  Patient received a course of Flagyl and Vanco.  I discontinue the aztreonam and clindamycin.  Patient was treated with an additional load of pain medicine, reports desire to go home.  She tolerated a small amount of water here.  Patient was encouraged to continue fluid repletion.  Will send home with Zofran and short course of Percocet.  PDMP reviewed, appropriate.  Patient has a listed allergy to Norco, however per PDMP was recently given Percocet prescription and tolerated well.  Low suspicion for UTI she is afebrile, and the urine specimen was from the urostomy.  Sent for culture.  Will hold treatment for now.  The patient appears reasonably screened and/or stabilized for discharge and I doubt any other medical condition or other [facility] requiring further screening, evaluation, or treatment in the ED at this time prior to discharge. I have discussed lab and/or imaging  findings with the patient and answered all questions/concerns to the best of my ability.I have discussed return precautions and OP follow up.  This was a shared visit with my supervising physician Dr. Tamera Punt who independently saw and evaluated the patient & provided guidance in evaluation/management/disposition ,in agreement with care   Final Clinical Impression(s) / ED Diagnoses Final diagnoses:  Generalized abdominal pain  Nausea and vomiting, intractability of vomiting not specified, unspecified vomiting type    Rx / DC Orders ED Discharge Orders         Ordered    ondansetron (ZOFRAN ODT) 4 MG disintegrating tablet  Every 8 hours PRN        03/15/20 1907    oxyCODONE-acetaminophen (PERCOCET/ROXICET) 5-325 MG tablet  Every 4 hours PRN        03/15/20 1907           Lyndel Safe 03/15/20 1923    Malvin Johns, MD 03/16/20 1313

## 2020-03-15 NOTE — ED Triage Notes (Signed)
Pt presents with emesis and abdominal pain that began this morning.  Pt reports diffuse abd pain but pain began on the left side.  Pt states that on the way over from [place], pt "passed out" for about 5 minutes.  Pt currently a/o x 4.

## 2020-03-15 NOTE — Consult Note (Signed)
Urology Consult   Physician requesting consult: Malvin Johns, MD  Reason for consult: Abdominal pain  History of Present Illness: Susan Snyder is a 54 y.o. s/p robotic assisted radical cystectomy and ileal conduit placement 99/2426 complicated by very distal stomal ischemia.  She was seen by Dr. Tresa Moore in the office on 03/13/2020 and was doing well with no significant bothersome pain.  She states she developed worsening left lower quadrant pain yesterday associated with nausea and emesis that is persisted for about 24 hours.  Her pain is now well controlled after she was given Dilaudid in the ED. Her last episode of emesis was around 1PM.  She states she feels much better after IV resuscitation. She is afebrile, WBC 11.7 with lactic acid 1.5.   CT A/P 03/15/2020 reveals extensive subcutaneous gas along the anterior abdominal wall extending to bilateral flanks.  Stents were in appropriate position. No other concerning finding.   Past Medical History:  Diagnosis Date  . Anemia   . Cancer ([place])   . CCC (chronic calculous cholecystitis) 04/03/2019  . Chronic diarrhea   . Chronic gastritis Sept. 2015   on EGD   . Collagenous colitis    colonoscopy biopsies 11/01/13  . Complication of anesthesia   . DDD (degenerative disc disease), cervical   . GERD (gastroesophageal reflux disease)   . History of kidney stones   . Hyperplastic polyps of stomach    Aug 2015  . Hypertension   . Neoplasm of uncertain behavior of skin 05/20/2016   Hx of skin cancer, BCC  . PONV (postoperative nausea and vomiting)    only with her hysterectomy    Past Surgical History:  Procedure Laterality Date  . ABDOMINAL HYSTERECTOMY  2005   partial, still has ovaries, due to fibroids.  . APPENDECTOMY    . [place]   x 4  . CHOLECYSTECTOMY N/A 04/11/2019   Procedure: LAPAROSCOPIC CHOLECYSTECTOMY;  Surgeon: Ronny Bacon, MD;  Location: [facility];  Service: General;  Laterality: N/A;  .  COLONOSCOPY W/ BIOPSIES  11/01/13   collagenous colitis, sigmoid polypectomy x 2; hyperplastic polyps  . CYSTECTOMY N/A 02/22/2020   Procedure: ENDOSCOPY OF ILEAL CONDUIT;  Surgeon: Alexis Frock, MD;  Location: [facility];  Service: Urology;  Laterality: N/A;  . CYSTOSCOPY WITH BIOPSY N/A 11/19/2019   Procedure: CYSTOSCOPY WITH BIOPSY;  Surgeon: [name], MD;  Location: [facility];  Service: Urology;  Laterality: N/A;  . CYSTOSCOPY WITH INJECTION N/A 02/20/2020   Procedure: CYSTOSCOPY WITH INJECTION OF INDOCYANINE GREEN DYE;  Surgeon: Alexis Frock, MD;  Location: [facility];  Service: Urology;  Laterality: N/A;  . ESOPHAGOGASTRODUODENOSCOPY  11/22/13   chronic gastritis  . HIP ARTHROPLASTY Bilateral 01/15/14,03/05/14   total hip, Dr. Nilsa Nutting  . LAPAROSCOPIC APPENDECTOMY N/A 08/20/2018   Procedure: APPENDECTOMY LAPAROSCOPIC;  Surgeon: Fredirick Maudlin, MD;  Location: [facility];  Service: General;  Laterality: N/A;  . LYMPHADENECTOMY Bilateral 02/20/2020   Procedure: Noel Journey;  Surgeon: Alexis Frock, MD;  Location: [facility];  Service: Urology;  Laterality: Bilateral;  . NECK SURGERY  2015  . ROBOT ASSISTED LAPAROSCOPIC COMPLETE CYSTECT ILEAL CONDUIT N/A 02/20/2020   Procedure: XI ROBOTIC ASSISTED LAPAROSCOPIC COMPLETE CYSTECT ILEAL CONDUIT AND BILATERAL OOPHORECTOMY;  Surgeon: Alexis Frock, MD;  Location: [facility];  Service: Urology;  Laterality: N/A;  6 HRS  . TRANSURETHRAL RESECTION OF BLADDER TUMOR WITH MITOMYCIN-C N/A 10/29/2019   Procedure: TRANSURETHRAL RESECTION OF BLADDER TUMOR;  Surgeon: [name], MD;  Location:  [facility];  Service: Urology;  Laterality: N/A;  . TRANSURETHRAL RESECTION OF BLADDER TUMOR WITH MITOMYCIN-C N/A 11/19/2019   Procedure: TRANSURETHRAL RESECTION OF BLADDER TUMOR WITH Gemcitabine;  Surgeon: [name], MD;  Location: [facility];  Service: Urology;  Laterality: N/A;     Current Hospital Medications:  Home meds:  Current Facility-Administered  Medications on File Prior to Encounter  Medication Dose Route Frequency Provider Last Rate Last Admin  . gemcitabine (GEMZAR) chemo syringe for bladder instillation 2,000 mg  2,000 mg Bladder Instillation Once Festus Aloe, MD      . gemcitabine [facility]) chemo syringe for bladder instillation 2,000 mg  2,000 mg Bladder Instillation Once [name], MD       Current Outpatient Medications on File Prior to Encounter  Medication Sig Dispense Refill  . acetaminophen (TYLENOL) 500 MG tablet Take 1,000 mg by mouth every 6 (six) hours as needed for mild pain.    Marland Kitchen aspirin EC 81 MG tablet Take 1 tablet (81 mg total) by mouth daily.    Marland Kitchen atorvastatin (LIPITOR) 10 MG tablet TAKE 1 TABLET BY MOUTH EVERYDAY AT BEDTIME (Patient taking differently: Take 10 mg by mouth at bedtime.) 90 tablet 3  . buPROPion (WELLBUTRIN XL) 150 MG 24 hr tablet Take 1 tablet (150 mg total) by mouth in the morning and at bedtime. (Patient taking differently: Take 150 mg by mouth daily.) 180 tablet 3  . calcium carbonate (TUMS - DOSED IN MG ELEMENTAL CALCIUM) 500 MG chewable tablet Chew 1 tablet by mouth daily as needed for indigestion or heartburn.     . docusate sodium (COLACE) 100 MG capsule Take 1 capsule (100 mg total) by mouth daily as needed. 30 capsule 2  . metoprolol succinate (TOPROL-XL) 25 MG 24 hr tablet Take 1 tablet (25 mg total) by mouth daily with breakfast. (Patient taking differently: Take 25 mg by mouth at bedtime.) 90 tablet 3  . oxyCODONE-acetaminophen (PERCOCET) 5-325 MG tablet Take 1 tablet by mouth every 6 (six) hours as needed for moderate pain or severe pain. Post-operatively 15 tablet 0  . simethicone (MYLICON) 300 MG chewable tablet Chew 125 mg by mouth every 6 (six) hours as needed for flatulence.    . Vitamin D, Cholecalciferol, 25 MCG (1000 UT) CAPS Take 1,000 Units by mouth daily.     . baclofen (LIORESAL) 10 MG tablet Take 0.5-1 tablets (5-10 mg total) by mouth 3 (three) times daily as needed  for muscle spasms. (Patient not taking: No sig reported) 30 each 2  . budesonide (ENTOCORT EC) 3 MG 24 hr capsule Take 3 capsules (9 mg total) by mouth daily. (Patient taking differently: Take 3-9 mg by mouth in the morning. ) 90 capsule 3  . oxybutynin (DITROPAN XL) 10 MG 24 hr tablet Take 1 tablet (10 mg total) by mouth at bedtime. (Patient not taking: No sig reported) 30 tablet 2  . pregabalin (LYRICA) 25 MG capsule Take 1 capsule (25 mg total) by mouth 2 (two) times daily. (Patient not taking: No sig reported) 120 capsule 2  . senna (SENOKOT) 8.6 MG TABS tablet Take 1 tablet (8.6 mg total) by mouth daily. (Patient not taking: Reported on 03/15/2020) 30 tablet 0     Scheduled Meds: . gemcitabine  2,000 mg Bladder Instillation Once  . gemcitabine  2,000 mg Bladder Instillation Once   Continuous Infusions: . vancomycin 2,000 mg (03/15/20 1734)   PRN Meds:.  Allergies:  Allergies  Allergen Reactions  . Adhesive [Tape] Other (See Comments)  Blisters  . Gabapentin Nausea And Vomiting  . Hydrocodone-Acetaminophen Nausea And Vomiting    hallucinations  . Keflex [Cephalexin] Shortness Of Breath and Palpitations  . Sulfa Antibiotics Shortness Of Breath and Palpitations  . Penicillins Rash    Did it involve swelling of the face/tongue/throat, SOB, or low BP? No Did it involve sudden or severe rash/hives, skin peeling, or any reaction on the inside of your mouth or nose? No Did you need to seek medical attention at a hospital or doctor's office? No When did it last happen?Childhood If all above answers are "NO", [place] proceed with cephalosporin use.     Family History  Problem Relation Age of Onset  . Stroke Mother   . Glaucoma Mother   . Heart disease Father   . Cancer Father        prostate  . Heart disease Sister   . Diabetes Sister   . Diabetes Paternal Aunt   . Heart disease Paternal Aunt   . Bleeding Disorder Paternal Uncle   . Heart disease Sister        Science writer  . Diabetes Paternal Aunt   . Heart disease Paternal Aunt     Social History:  reports that she quit smoking about 5 months ago. Her smoking use included cigarettes. She has a 30.00 pack-year smoking history. She has never used smokeless tobacco. She reports current alcohol use. She reports that she does not use drugs.  ROS: A complete review of systems was performed.  All systems are negative except for pertinent findings as noted.  Physical Exam:  Vital signs in last 24 hours: Temp:  [98.8 F (37.1 C)] 98.8 F (37.1 C) (12/25 1326) Pulse Rate:  [93-103] 100 (12/25 1800) Resp:  [11-29] 16 (12/25 1800) BP: (149-179)/(77-92) 156/77 (12/25 1800) SpO2:  [92 %-100 %] 100 % (12/25 1800) Weight:  [103.4 kg] 103.4 kg (12/25 1347) Constitutional:  Alert and oriented, No acute distress Cardiovascular: Regular rate and rhythm Respiratory: Normal respiratory effort, Lungs clear bilaterally GI: Abdomen is soft, non-distended, moderately tender throughout, no peritoneal findings, no erythema, no crepitus, no induration or fluctuance. GU: No CVA tenderness Neurologic: Grossly intact, no focal deficits Psychiatric: Normal mood and affect  Laboratory Data:  Recent Labs    03/15/20 1414  WBC 11.7*  HGB 10.5*  HCT 33.4*  PLT 382    Recent Labs    03/15/20 1414  NA 139  K 3.4*  CL 106  GLUCOSE 125*  BUN 17  CALCIUM 8.7*  CREATININE 1.88*     Results for orders placed or performed during the hospital encounter of 03/15/20 (from the past 24 hour(s))  Urinalysis, Routine w reflex microscopic Urine, Unspecified Source     Status: Abnormal   Collection Time: 03/15/20  1:57 PM  Result Value Ref Range   Color, Urine YELLOW YELLOW   APPearance CLOUDY (A) CLEAR   Specific Gravity, Urine 1.018 1.005 - 1.030   pH 5.0 5.0 - 8.0   Glucose, UA NEGATIVE NEGATIVE mg/dL   Hgb urine dipstick LARGE (A) NEGATIVE   Bilirubin Urine NEGATIVE NEGATIVE   Ketones, ur 5 (A) NEGATIVE mg/dL    Protein, ur 100 (A) NEGATIVE mg/dL   Nitrite NEGATIVE NEGATIVE   Leukocytes,Ua LARGE (A) NEGATIVE   RBC / HPF >50 (H) 0 - 5 RBC/hpf   WBC, UA >50 (H) 0 - 5 WBC/hpf   Bacteria, UA MANY (A) NONE SEEN   Mucus PRESENT    Hyaline Casts, UA  PRESENT   Lipase, blood     Status: None   Collection Time: 03/15/20  2:14 PM  Result Value Ref Range   Lipase 26 11 - 51 U/L  Comprehensive metabolic panel     Status: Abnormal   Collection Time: 03/15/20  2:14 PM  Result Value Ref Range   Sodium 139 135 - 145 mmol/L   Potassium 3.4 (L) 3.5 - 5.1 mmol/L   Chloride 106 98 - 111 mmol/L   CO2 23 22 - 32 mmol/L   Glucose, Bld 125 (H) 70 - 99 mg/dL   BUN 17 6 - 20 mg/dL   Creatinine, Ser 1.88 (H) 0.44 - 1.00 mg/dL   Calcium 8.7 (L) 8.9 - 10.3 mg/dL   Total Protein 6.6 6.5 - 8.1 g/dL   Albumin 3.5 3.5 - 5.0 g/dL   AST 21 15 - 41 U/L   ALT 23 0 - 44 U/L   Alkaline Phosphatase 73 38 - 126 U/L   Total Bilirubin 0.6 0.3 - 1.2 mg/dL   GFR, Estimated 31 (L) >60 mL/min   Anion gap 10 5 - 15  CBC     Status: Abnormal   Collection Time: 03/15/20  2:14 PM  Result Value Ref Range   WBC 11.7 (H) 4.0 - 10.5 K/uL   RBC 3.65 (L) 3.87 - 5.11 MIL/uL   Hemoglobin 10.5 (L) 12.0 - 15.0 g/dL   HCT 33.4 (L) 36.0 - 46.0 %   MCV 91.5 80.0 - 100.0 fL   MCH 28.8 26.0 - 34.0 pg   MCHC 31.4 30.0 - 36.0 g/dL   RDW 13.3 11.5 - 15.5 %   Platelets 382 150 - 400 K/uL   nRBC 0.0 0.0 - 0.2 %  Lactic acid, plasma     Status: None   Collection Time: 03/15/20  4:19 PM  Result Value Ref Range   Lactic Acid, Venous 1.5 0.5 - 1.9 mmol/L   No results found for this or any previous visit (from the past 240 hour(s)).  Renal Function: Recent Labs    03/15/20 1414  CREATININE 1.88*   Estimated Creatinine Clearance: 43.3 mL/min (A) (by C-G formula based on SCr of 1.88 mg/dL (H)).  Radiologic Imaging: CT ABDOMEN PELVIS WO CONTRAST  Result Date: 03/15/2020 CLINICAL DATA:  LEFT lower quadrant abdominal pain and vomiting since  this morning, syncopal episode of 5 minutes duration; recent cystectomy 02/22/2020 with ileal conduit EXAM: CT ABDOMEN AND PELVIS WITHOUT CONTRAST TECHNIQUE: Multidetector CT imaging of the abdomen and pelvis was performed following the standard protocol without IV contrast. Sagittal and coronal MPR images reconstructed from axial data set. No oral contrast administered COMPARISON:  11/05/2019 FINDINGS: Lower chest: Minimal subsegmental atelectasis LEFT base Hepatobiliary: Gallbladder surgically absent.  Liver unremarkable. Pancreas: Normal appearance Spleen: Normal appearance Adrenals/Urinary Tract: Adrenal glands normal appearance. BILATERAL ureteral stents extending to urinary diversion in the mid abdomen. Minimal LEFT renal collecting system dilatation. No urinary tract calcifications or ureteral dilatation. Post cystectomy. Bladder bed obscured by beam hardening artifacts from hip prostheses. Stomach/Bowel: Stomach and small bowel loops unremarkable. Colon decompressed. Appendix not identified. Vascular/Lymphatic: Atherosclerotic calcifications aorta without aneurysm. No adenopathy. Reproductive: Uterus surgically absent by history. Unremarkable LEFT ovary. RIGHT ovary not definitely visualized. Other: Small amount of free fluid in pelvis and adjacent to liver/spleen. No free intraperitoneal air. Extensive gas identified within the subcutaneous soft tissues of the anterior abdominal wall extending to the flanks of the abdomen and pelvis, excessive for 3 weeks post surgery. No definite  skin thickening or significant edema/fluid collections. This could represent cellulitis/fasciitis by gas-forming organism, less likely postoperative. This extends to the proximal anterior LEFT thigh. Musculoskeletal: BILATERAL hip prostheses. No acute osseous findings. IMPRESSION: Extensive gas within the subcutaneous soft tissues of the anterior abdominal wall extending to the flanks of the abdomen and pelvis, atypical for 3  weeks post surgery; this could reflect soft tissue infection by a gas-forming organism cellulitis/fasciitis, with atypical postoperative gas considered less likely. Small amount of nonspecific free fluid in pelvis and adjacent to liver/spleen. BILATERAL ureteral stents extending to urinary diversion in the mid abdomen. Minimal LEFT renal collecting system dilatation. Aortic Atherosclerosis (ICD10-I70.0). Findings called to Sharyn Lull PA on 03/15/2020 at 1615 hours. Electronically Signed   By: Lavonia Dana M.D.   On: 03/15/2020 16:18    I independently reviewed the above imaging studies.  Impression/Recommendation: 1. Subcutaneous emphysema of the abdominal wall: Presence of air due to CO2 from her robotic assisted lap RC/IC on 02/20/2020. 2. Abdominal pain: Likely due to subcutaneous emphysema and post-operative changes.  3. Dehydration with AKI: Cr 1.8 from baseline 1.1. Has not been tolerating PO well in the past 48 hours. CT A/P 12/25 without hydronephrosis with stents in appropriate position. 4. pT1 with CISN0M0 HG  UCB s/p RARC/IC on 02/20/2020  -Exam non-concerning for infection/fascitis.  -Subcutaneous emphysema is residual CO2 from lap procedure on 02/20/2020. Discussed with patient that this can take months to resolve completely. -She feeling much better with IV resuscitation, pain control and anti-emetics.  -Discussed options, she would prefer discharge home with pain control and anti-emetics. -Discharge home after tolerating PO  -Followup as scheduled with [facility]  Matt R. Quashaun Lazalde MD 03/15/2020, 6:33 PM  [facility]  Pager: [phone]   CC: Malvin Johns, MD

## 2020-03-15 NOTE — ED Notes (Signed)
Patient transported to CT 

## 2020-03-15 NOTE — Progress Notes (Signed)
A consult was received from an ED provider for Vancomycin and Aztreonam per pharmacy dosing.  The patient's profile has been reviewed for ht/wt/allergies/indication/available labs.    A one time order has been placed for Vancomycin 2g IV and Aztreonam 2g IV.  Further antibiotics/pharmacy consults should be ordered by admitting physician if indicated.                       Thank you, Luiz Ochoa 03/15/2020  5:55 PM

## 2020-03-15 NOTE — Discharge Instructions (Addendum)
As discussed with Dr. Abner Greenspan, your symptoms are likely are due to the gas that is left over in your abdomen from the surgery.  Please continue to drink plenty of fluids as your kidney function today was elevated which is likely due to dehydration.  Please take the nausea medicine and pain medicine as needed.  Please make sure to follow-up with your urologist.  Return to the ER for any new or worsening symptoms.

## 2020-03-17 LAB — URINE CULTURE: Culture: >=100000 — AB

## 2020-03-18 ENCOUNTER — Telehealth: Payer: Self-pay | Admitting: Emergency Medicine

## 2020-03-18 NOTE — Telephone Encounter (Signed)
Post ED Visit - Positive Culture Follow-up  Culture report reviewed by antimicrobial stewardship pharmacist: [place] Team []  [address], Pharm.D. []  Heide Guile, Pharm.D., BCPS AQ-ID []  Parks Neptune, Pharm.D., BCPS []  Alycia Rossetti, Pharm.D., BCPS []  [place], Pharm.D., BCPS, AAHIVP []  Legrand Como, Pharm.D., BCPS, AAHIVP []  Salome Arnt, PharmD, BCPS []  Johnnette Gourd, PharmD, BCPS []  Hughes Better, PharmD, BCPS []  Leeroy Cha, PharmD []  Laqueta Linden, PharmD, BCPS []  Albertina Parr, PharmD  [place] Team []  Leodis Sias, PharmD []  Lindell Spar, PharmD []  Royetta Asal, PharmD []  Graylin Shiver, Rph []  Rema Fendt) Glennon Mac, PharmD []  Arlyn Dunning, PharmD []  Netta Cedars, PharmD []  Dia Sitter, PharmD []  Leone Haven, PharmD []  Gretta Arab, PharmD []  Theodis Shove, PharmD [x]  Peggyann Juba, PharmD []  Reuel Boom, PharmD   Positive urine culture Faxed urine culture results to [facility], Dr Tresa Moore @  [phone] no further patient follow-up is required at this time.  Hazle Nordmann 03/18/2020, 11:38 AM

## 2020-03-20 LAB — CULTURE, BLOOD (ROUTINE X 2)
Culture: NO GROWTH
Culture: NO GROWTH

## 2020-03-31 ENCOUNTER — Encounter: Payer: Self-pay | Admitting: Family Medicine

## 2020-08-05 ENCOUNTER — Other Ambulatory Visit: Payer: Self-pay

## 2020-08-05 ENCOUNTER — Other Ambulatory Visit (HOSPITAL_COMMUNITY): Payer: Self-pay | Admitting: Urology

## 2020-08-05 ENCOUNTER — Ambulatory Visit (HOSPITAL_COMMUNITY)
Admission: RE | Admit: 2020-08-05 | Discharge: 2020-08-05 | Disposition: A | Discharge status: Home / Self Care | Payer: 59 | Source: Ambulatory Visit | Attending: Urology | Admitting: Urology

## 2020-08-05 DIAGNOSIS — C678 Malignant neoplasm of overlapping sites of bladder: Secondary | ICD-10-CM | POA: Diagnosis present

## 2020-09-11 ENCOUNTER — Encounter: Payer: Self-pay | Admitting: Family Medicine

## 2020-09-15 ENCOUNTER — Other Ambulatory Visit: Payer: Self-pay | Admitting: Family Medicine

## 2020-09-15 DIAGNOSIS — I10 Essential (primary) hypertension: Secondary | ICD-10-CM

## 2020-09-18 ENCOUNTER — Ambulatory Visit: Payer: 59 | Admitting: Unknown Physician Specialty

## 2020-09-18 ENCOUNTER — Ambulatory Visit (INDEPENDENT_AMBULATORY_CARE_PROVIDER_SITE_OTHER): Payer: 59 | Admitting: Unknown Physician Specialty

## 2020-09-18 ENCOUNTER — Encounter: Payer: Self-pay | Admitting: Unknown Physician Specialty

## 2020-09-18 ENCOUNTER — Other Ambulatory Visit: Payer: Self-pay

## 2020-09-18 VITALS — HR 99 | Temp 98.1°F | Resp 16 | Ht 68.0 in | Wt 230.8 lb

## 2020-09-18 DIAGNOSIS — I10 Essential (primary) hypertension: Secondary | ICD-10-CM

## 2020-09-18 DIAGNOSIS — R5383 Other fatigue: Secondary | ICD-10-CM

## 2020-09-18 DIAGNOSIS — E782 Mixed hyperlipidemia: Secondary | ICD-10-CM | POA: Diagnosis not present

## 2020-09-18 DIAGNOSIS — F430 Acute stress reaction: Secondary | ICD-10-CM | POA: Diagnosis not present

## 2020-09-18 MED ORDER — METOPROLOL SUCCINATE ER 25 MG PO TB24: 25.0000 mg | ORAL_TABLET | Freq: Every day | ORAL | 3 refills | Status: DC

## 2020-09-18 MED ORDER — ATORVASTATIN CALCIUM 10 MG PO TABS
10.0000 mg | ORAL_TABLET | Freq: Every day | ORAL | 3 refills | Status: DC
Start: 2020-09-18 — End: 2021-10-29

## 2020-09-18 NOTE — Assessment & Plan Note (Signed)
Check labs today and adjust meds as needed

## 2020-09-18 NOTE — Assessment & Plan Note (Signed)
Taking Wellbutrin.  Continure for now.  Discussed counseling and whe will consider as she adjusts

## 2020-09-18 NOTE — Progress Notes (Signed)
Pulse 99   Temp 98.1 F (36.7 C)   Resp 16   Ht 5' 8"  (1.727 m)   Wt 230 lb 12.8 oz (104.7 kg)   SpO2 98%   BMI 35.09 kg/m    Subjective:    [name], female    DOB: [birth year], 55 y.o.   MRN: [id number]  HPI: Susan Snyder is a 55 y.o. female  Chief Complaint  Patient presents with   Follow-up   Medication Refill   Hypertension Using medications without difficulty Average home BPsSBP 130s  No problems or lightheadedness No chest pain with exertion or shortness of breath No Edema   Hyperlipidemia Using medications without problems: No Muscle aches    States she struggles due to life altering situation of bladder removal secondary to bladder cancer.  Recently started work back to full time.   Depression screen [facility] 2/9 09/18/2020 12/06/2019 08/29/2019 07/24/2019 01/30/2019  Decreased Interest 2 0 0 0 0  Down, Depressed, Hopeless 2 0 0 2 0  PHQ - 2 Score 4 0 0 2 0  Altered sleeping - - 0 2 0  Tired, decreased energy - - 0 2 0  Change in appetite - - 0 0 0  Feeling bad or failure about yourself  - - 0 0 0  Trouble concentrating - - 0 0 0  Moving slowly or fidgety/restless - - 0 0 0  Suicidal thoughts - - 0 0 0  PHQ-9 Score - - 0 6 0  Difficult doing work/chores - - Not difficult at all Somewhat difficult Not difficult at all     Relevant past medical, surgical, family and social history reviewed and updated as indicated. Interim medical history since our last visit reviewed. Allergies and medications reviewed and updated.  Review of Systems  Constitutional:  Positive for fatigue.  Respiratory: Negative.    Cardiovascular: Negative.   Gastrointestinal: Negative.    Per HPI unless specifically indicated above     Objective:    Pulse 99   Temp 98.1 F (36.7 C)   Resp 16   Ht 5' 8"  (1.727 m)   Wt 230 lb 12.8 oz (104.7 kg)   SpO2 98%   BMI 35.09 kg/m   Wt Readings from Last 3 Encounters:  09/18/20 230 lb 12.8 oz (104.7 kg)  03/15/20 228 lb  (103.4 kg)  02/19/20 228 lb 4.8 oz (103.6 kg)    Physical Exam Constitutional:      General: She is not in acute distress.    Appearance: Normal appearance. She is well-developed.  HENT:     Head: Normocephalic and atraumatic.  Eyes:     General: Lids are normal. No scleral icterus.       Right eye: No discharge.        Left eye: No discharge.     Conjunctiva/sclera: Conjunctivae normal.  Neck:     Vascular: No carotid bruit or JVD.  Cardiovascular:     Rate and Rhythm: Normal rate and regular rhythm.     Heart sounds: Normal heart sounds.  Pulmonary:     Effort: Pulmonary effort is normal. No respiratory distress.     Breath sounds: Normal breath sounds.  Abdominal:     Palpations: There is no hepatomegaly or splenomegaly.  Musculoskeletal:        General: Normal range of motion.     Cervical back: Normal range of motion and neck supple.  Skin:    General: Skin is  warm and dry.     Coloration: Skin is not pale.     Findings: No rash.  Neurological:     Mental Status: She is alert and oriented to person, place, and time.  Psychiatric:        Behavior: Behavior normal.        Thought Content: Thought content normal.        Judgment: Judgment normal.    Results for orders placed or performed during the hospital encounter of 03/15/20  Urine culture   Specimen: Urine, Random  Result Value Ref Range   Specimen Description      URINE, RANDOM Performed at [place] [address] [place], [place] [address]    Special Requests      NONE Performed at [facility], [place] [address] [place], [place] [address]    Culture >=100,000 COLONIES/mL ENTEROCOCCUS FAECALIS (A)    Report Status 03/17/2020 FINAL    Organism ID, Bacteria ENTEROCOCCUS FAECALIS (A)       Susceptibility   Enterococcus faecalis - MIC*    AMPICILLIN <=2 SENSITIVE Sensitive     NITROFURANTOIN <=16 SENSITIVE Sensitive     VANCOMYCIN 1 SENSITIVE Sensitive     *  >=100,000 COLONIES/mL ENTEROCOCCUS FAECALIS  Blood culture (routine x 2)   Specimen: BLOOD  Result Value Ref Range   Specimen Description      BLOOD RIGHT ANTECUBITAL Performed at [place] [address], [place], [place] [address]    Special Requests      BOTTLES DRAWN AEROBIC AND ANAEROBIC Blood Culture results [place] not be optimal due to an inadequate volume of blood received in culture bottles Performed at [facility], [place] [address] [place], [place] [address]    Culture      NO GROWTH 5 DAYS Performed at [facility], [place] [address], [place], [place] [address]    Report Status 03/20/2020 FINAL   Blood culture (routine x 2)   Specimen: BLOOD  Result Value Ref Range   Specimen Description      BLOOD LEFT HAND Performed at [place] [address] [place], [place] [address]    Special Requests      BOTTLES DRAWN AEROBIC ONLY Blood Culture results [place] not be optimal due to an inadequate volume of blood received in culture bottles Performed at [place] [address], [place], [place] [address]    Culture      NO GROWTH 5 DAYS Performed at [facility], [place] [address] [place], [place] [address]    Report Status 03/20/2020 FINAL   Resp Panel by RT-PCR (Flu A&B, Covid) Nasopharyngeal Swab   Specimen: Nasopharyngeal Swab; Nasopharyngeal(NP) swabs in vial transport medium  Result Value Ref Range   SARS Coronavirus 2 by RT PCR NEGATIVE NEGATIVE   Influenza A by PCR NEGATIVE NEGATIVE   Influenza B by PCR NEGATIVE NEGATIVE  Lipase, blood  Result Value Ref Range   Lipase 26 11 - 51 U/L  Comprehensive metabolic panel  Result Value Ref Range   Sodium 139 135 - 145 mmol/L   Potassium 3.4 (L) 3.5 - 5.1 mmol/L   Chloride 106 98 - 111 mmol/L   CO2 23 22 - 32 mmol/L   Glucose, Bld 125 (H) 70 - 99 mg/dL   BUN 17 6 - 20 mg/dL   Creatinine, Ser 1.88 (H) 0.44 - 1.00 mg/dL   Calcium  8.7 (  L) 8.9 - 10.3 mg/dL   Total Protein 6.6 6.5 - 8.1 g/dL   Albumin 3.5 3.5 - 5.0 g/dL   AST 21 15 - 41 U/L   ALT 23 0 - 44 U/L   Alkaline Phosphatase 73 38 - 126 U/L   Total Bilirubin 0.6 0.3 - 1.2 mg/dL   GFR, Estimated 31 (L) >60 mL/min   Anion gap 10 5 - 15  CBC  Result Value Ref Range   WBC 11.7 (H) 4.0 - 10.5 K/uL   RBC 3.65 (L) 3.87 - 5.11 MIL/uL   Hemoglobin 10.5 (L) 12.0 - 15.0 g/dL   HCT 33.4 (L) 36.0 - 46.0 %   MCV 91.5 80.0 - 100.0 fL   MCH 28.8 26.0 - 34.0 pg   MCHC 31.4 30.0 - 36.0 g/dL   RDW 13.3 11.5 - 15.5 %   Platelets 382 150 - 400 K/uL   nRBC 0.0 0.0 - 0.2 %  Urinalysis, Routine w reflex microscopic Urine, Unspecified Source  Result Value Ref Range   Color, Urine YELLOW YELLOW   APPearance CLOUDY (A) CLEAR   Specific Gravity, Urine 1.018 1.005 - 1.030   pH 5.0 5.0 - 8.0   Glucose, UA NEGATIVE NEGATIVE mg/dL   Hgb urine dipstick LARGE (A) NEGATIVE   Bilirubin Urine NEGATIVE NEGATIVE   Ketones, ur 5 (A) NEGATIVE mg/dL   Protein, ur 100 (A) NEGATIVE mg/dL   Nitrite NEGATIVE NEGATIVE   Leukocytes,Ua LARGE (A) NEGATIVE   RBC / HPF >50 (H) 0 - 5 RBC/hpf   WBC, UA >50 (H) 0 - 5 WBC/hpf   Bacteria, UA MANY (A) NONE SEEN   Mucus PRESENT    Hyaline Casts, UA PRESENT   Lactic acid, plasma  Result Value Ref Range   Lactic Acid, Venous 1.5 0.5 - 1.9 mmol/L      Assessment & Plan:   Problem List Items Addressed This Visit       Unprioritized   Hypertension (Chronic)    Stable, continue present medications.         Relevant Medications   metoprolol succinate (TOPROL-XL) 25 MG 24 hr tablet   atorvastatin (LIPITOR) 10 MG tablet   Other Relevant Orders   COMPLETE METABOLIC PANEL WITH GFR   Mixed hyperlipidemia    Check labs today and adjust meds as needed       Relevant Medications   metoprolol succinate (TOPROL-XL) 25 MG 24 hr tablet   atorvastatin (LIPITOR) 10 MG tablet   Other Relevant Orders   Lipid panel   Stress reaction    Taking  Wellbutrin.  Continure for now.  Discussed counseling and whe will consider as she adjusts       Other Visit Diagnoses     Fatigue, unspecified type    -  Primary   Relevant Orders   CBC with Differential/Platelet   TSH   Essential hypertension       Well-controlled blood pressure, metoprolol not first-line but she has been on for some time and it works for her, can continue same meds   Relevant Medications   metoprolol succinate (TOPROL-XL) 25 MG 24 hr tablet   atorvastatin (LIPITOR) 10 MG tablet        Follow up plan: Return in about 6 months (around 03/20/2021), or if symptoms worsen or fail to improve, for physical.

## 2020-09-18 NOTE — Assessment & Plan Note (Signed)
Stable, continue present medications.   

## 2020-09-19 LAB — CBC WITH DIFFERENTIAL/PLATELET
Absolute Monocytes: 489 cells/uL (ref 200–950)
Basophils Absolute: 42 cells/uL (ref 0–200)
Basophils Relative: 0.4 %
Eosinophils Absolute: 198 cells/uL (ref 15–500)
Eosinophils Relative: 1.9 %
HCT: 37.1 % (ref 35.0–45.0)
Hemoglobin: 11.9 g/dL (ref 11.7–15.5)
Lymphs Abs: 2808 cells/uL (ref 850–3900)
MCH: 27.5 pg (ref 27.0–33.0)
MCHC: 32.1 g/dL (ref 32.0–36.0)
MCV: 85.7 fL (ref 80.0–100.0)
MPV: 10.4 fL (ref 7.5–12.5)
Monocytes Relative: 4.7 %
Neutro Abs: 6864 cells/uL (ref 1500–7800)
Neutrophils Relative %: 66 %
Platelets: 357 10*3/uL (ref 140–400)
RBC: 4.33 10*6/uL (ref 3.80–5.10)
RDW: 13 % (ref 11.0–15.0)
Total Lymphocyte: 27 %
WBC: 10.4 10*3/uL (ref 3.8–10.8)

## 2020-09-19 LAB — COMPLETE METABOLIC PANEL WITH GFR
AG Ratio: 1.4 (calc) (ref 1.0–2.5)
ALT: 19 U/L (ref 6–29)
AST: 16 U/L (ref 10–35)
Albumin: 3.9 g/dL (ref 3.6–5.1)
Alkaline phosphatase (APISO): 90 U/L (ref 37–153)
BUN/Creatinine Ratio: 19 (calc) (ref 6–22)
BUN: 23 mg/dL (ref 7–25)
CO2: 26 mmol/L (ref 20–32)
Calcium: 9.3 mg/dL (ref 8.6–10.4)
Chloride: 105 mmol/L (ref 98–110)
Creat: 1.22 mg/dL — ABNORMAL HIGH (ref 0.50–1.05)
GFR, Est African American: 57 mL/min/{1.73_m2} — ABNORMAL LOW (ref >=60)
GFR, Est Non African American: 49 mL/min/{1.73_m2} — ABNORMAL LOW (ref >=60)
Globulin: 2.8 g/dL (calc) (ref 1.9–3.7)
Glucose, Bld: 80 mg/dL (ref 65–99)
Potassium: 4.5 mmol/L (ref 3.5–5.3)
Sodium: 140 mmol/L (ref 135–146)
Total Bilirubin: 0.3 mg/dL (ref 0.2–1.2)
Total Protein: 6.7 g/dL (ref 6.1–8.1)

## 2020-09-19 LAB — LIPID PANEL
Cholesterol: 132 mg/dL (ref <200)
HDL: 38 mg/dL — ABNORMAL LOW (ref >=50)
LDL Cholesterol (Calc): 66 mg/dL (calc)
Non-HDL Cholesterol (Calc): 94 mg/dL (calc) (ref <130)
Total CHOL/HDL Ratio: 3.5 (calc) (ref <5.0)
Triglycerides: 214 mg/dL — ABNORMAL HIGH (ref <150)

## 2020-09-19 LAB — TSH: TSH: 1.19 mIU/L (ref 0.40–4.50)

## 2020-12-10 ENCOUNTER — Other Ambulatory Visit (HOSPITAL_COMMUNITY): Payer: Self-pay | Admitting: Adult Health

## 2020-12-10 ENCOUNTER — Ambulatory Visit (HOSPITAL_COMMUNITY)
Admission: RE | Admit: 2020-12-10 | Discharge: 2020-12-10 | Disposition: A | Discharge status: Home / Self Care | Payer: 59 | Source: Ambulatory Visit | Attending: Adult Health | Admitting: Adult Health

## 2020-12-10 ENCOUNTER — Other Ambulatory Visit: Payer: Self-pay

## 2020-12-10 DIAGNOSIS — Z8551 Personal history of malignant neoplasm of bladder: Secondary | ICD-10-CM | POA: Insufficient documentation

## 2020-12-29 ENCOUNTER — Encounter: Payer: Self-pay | Admitting: General Surgery

## 2021-01-02 ENCOUNTER — Encounter: Payer: Self-pay | Admitting: Family Medicine

## 2021-03-14 ENCOUNTER — Emergency Department
Admission: EM | Admit: 2021-03-14 | Discharge: 2021-03-14 | Disposition: A | Discharge status: Home / Self Care | Payer: 59 | Attending: Emergency Medicine | Admitting: Emergency Medicine

## 2021-03-14 ENCOUNTER — Emergency Department: Payer: 59

## 2021-03-14 ENCOUNTER — Other Ambulatory Visit: Payer: Self-pay

## 2021-03-14 ENCOUNTER — Encounter: Payer: Self-pay | Admitting: Emergency Medicine

## 2021-03-14 DIAGNOSIS — Z8551 Personal history of malignant neoplasm of bladder: Secondary | ICD-10-CM | POA: Diagnosis not present

## 2021-03-14 DIAGNOSIS — B9689 Other specified bacterial agents as the cause of diseases classified elsewhere: Secondary | ICD-10-CM | POA: Insufficient documentation

## 2021-03-14 DIAGNOSIS — N3001 Acute cystitis with hematuria: Secondary | ICD-10-CM | POA: Diagnosis not present

## 2021-03-14 DIAGNOSIS — Z85828 Personal history of other malignant neoplasm of skin: Secondary | ICD-10-CM | POA: Diagnosis not present

## 2021-03-14 DIAGNOSIS — Z96643 Presence of artificial hip joint, bilateral: Secondary | ICD-10-CM | POA: Diagnosis not present

## 2021-03-14 DIAGNOSIS — Z7982 Long term (current) use of aspirin: Secondary | ICD-10-CM | POA: Insufficient documentation

## 2021-03-14 DIAGNOSIS — Z79899 Other long term (current) drug therapy: Secondary | ICD-10-CM | POA: Insufficient documentation

## 2021-03-14 DIAGNOSIS — Z87891 Personal history of nicotine dependence: Secondary | ICD-10-CM | POA: Insufficient documentation

## 2021-03-14 DIAGNOSIS — N390 Urinary tract infection, site not specified: Secondary | ICD-10-CM

## 2021-03-14 DIAGNOSIS — I10 Essential (primary) hypertension: Secondary | ICD-10-CM | POA: Insufficient documentation

## 2021-03-14 DIAGNOSIS — R109 Unspecified abdominal pain: Secondary | ICD-10-CM | POA: Diagnosis present

## 2021-03-14 LAB — COMPREHENSIVE METABOLIC PANEL
ALT: 15 U/L (ref 0–44)
AST: 13 U/L — ABNORMAL LOW (ref 15–41)
Albumin: 3.8 g/dL (ref 3.5–5.0)
Alkaline Phosphatase: 86 U/L (ref 38–126)
Anion gap: 6 (ref 5–15)
BUN: 19 mg/dL (ref 6–20)
CO2: 23 mmol/L (ref 22–32)
Calcium: 8.8 mg/dL — ABNORMAL LOW (ref 8.9–10.3)
Chloride: 108 mmol/L (ref 98–111)
Creatinine, Ser: 1.29 mg/dL — ABNORMAL HIGH (ref 0.44–1.00)
GFR, Estimated: 49 mL/min — ABNORMAL LOW (ref >60)
Glucose, Bld: 114 mg/dL — ABNORMAL HIGH (ref 70–99)
Potassium: 3.5 mmol/L (ref 3.5–5.1)
Sodium: 137 mmol/L (ref 135–145)
Total Bilirubin: 0.8 mg/dL (ref 0.3–1.2)
Total Protein: 7.4 g/dL (ref 6.5–8.1)

## 2021-03-14 LAB — CBC
HCT: 37.3 % (ref 36.0–46.0)
Hemoglobin: 11.7 g/dL — ABNORMAL LOW (ref 12.0–15.0)
MCH: 28.1 pg (ref 26.0–34.0)
MCHC: 31.4 g/dL (ref 30.0–36.0)
MCV: 89.4 fL (ref 80.0–100.0)
Platelets: 340 10*3/uL (ref 150–400)
RBC: 4.17 MIL/uL (ref 3.87–5.11)
RDW: 15 % (ref 11.5–15.5)
WBC: 12.5 10*3/uL — ABNORMAL HIGH (ref 4.0–10.5)
nRBC: 0 % (ref 0.0–0.2)

## 2021-03-14 LAB — URINALYSIS, MICROSCOPIC (REFLEX): Bacteria, UA: NONE SEEN

## 2021-03-14 LAB — URINALYSIS, ROUTINE W REFLEX MICROSCOPIC
Bilirubin Urine: NEGATIVE
Glucose, UA: NEGATIVE mg/dL
Ketones, ur: NEGATIVE mg/dL
Nitrite: POSITIVE — AB
Protein, ur: 30 mg/dL — AB
Specific Gravity, Urine: 1.015 (ref 1.005–1.030)
pH: 7 (ref 5.0–8.0)

## 2021-03-14 LAB — LIPASE, BLOOD: Lipase: 40 U/L (ref 11–51)

## 2021-03-14 MED ORDER — CIPROFLOXACIN HCL 500 MG PO TABS: 500.0000 mg | ORAL_TABLET | Freq: Two times a day (BID) | ORAL | 0 refills | Status: DC

## 2021-03-14 MED ORDER — IOHEXOL 300 MG/ML  SOLN
100.0000 mL | Freq: Once | INTRAMUSCULAR | Status: AC | PRN
  Administered 2021-03-14: 09:00:00 100 mL via INTRAVENOUS
  Filled 2021-03-14: qty 100

## 2021-03-14 MED ORDER — MORPHINE SULFATE (PF) 4 MG/ML IV SOLN
4.0000 mg | Freq: Once | INTRAVENOUS | Status: AC
  Administered 2021-03-14: 09:00:00 4 mg via INTRAVENOUS
  Filled 2021-03-14: qty 1

## 2021-03-14 MED ORDER — SODIUM CHLORIDE 0.9 % IV SOLN
1000.0000 mL | Freq: Once | INTRAVENOUS | Status: AC
  Administered 2021-03-14: 09:00:00 1000 mL via INTRAVENOUS

## 2021-03-14 MED ORDER — CIPROFLOXACIN IN D5W 400 MG/200ML IV SOLN
400.0000 mg | Freq: Once | INTRAVENOUS | Status: AC
  Administered 2021-03-14: 10:00:00 400 mg via INTRAVENOUS
  Filled 2021-03-14: qty 200

## 2021-03-14 MED ORDER — ONDANSETRON HCL 4 MG PO TABS: 4.0000 mg | ORAL_TABLET | Freq: Every day | ORAL | 1 refills | Status: DC | PRN

## 2021-03-14 MED ORDER — ONDANSETRON HCL 4 MG/2ML IJ SOLN
4.0000 mg | Freq: Once | INTRAMUSCULAR | Status: AC
  Administered 2021-03-14: 09:00:00 4 mg via INTRAVENOUS
  Filled 2021-03-14: qty 2

## 2021-03-14 NOTE — ED Provider Notes (Signed)
[facility] Emergency Department Provider Note   ____________________________________________    I have reviewed the triage vital signs and the nursing notes.   HISTORY  Chief Complaint Abdominal Pain     HPI Susan Snyder is a 55 y.o. female with history of bladder cancer now with urostomy who presents with complaints of abdominal discomfort some right flank pain and chills with nausea.  She reports this started over the last 1 to 2 days.  She has not take anything for this.  She reports her bladder was removed and now she has a urostomy  Past Medical History:  Diagnosis Date   Anemia    Cancer ([place])    CCC (chronic calculous cholecystitis) 04/03/2019   Chronic diarrhea    Chronic gastritis Sept. 2015   on EGD    Collagenous colitis    colonoscopy biopsies 0/76/22   Complication of anesthesia    DDD (degenerative disc disease), cervical    GERD (gastroesophageal reflux disease)    History of kidney stones    Hyperplastic polyps of stomach    Aug 2015   Hypertension    Neoplasm of uncertain behavior of skin 05/20/2016   Hx of skin cancer, BCC   PONV (postoperative nausea and vomiting)    only with her hysterectomy    Patient Active Problem List   Diagnosis Date Noted   Bladder cancer ([place]) 02/20/2020   Mixed hyperlipidemia 12/20/2019   History of recurrent UTIs 08/01/2019   Major depressive disorder with current active episode 08/01/2019   Status post laparoscopic cholecystectomy 04/19/2019   Status post laparoscopic appendectomy 04/03/2019   Neoplasm of uncertain behavior of skin 05/20/2016   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   Vitamin B12 deficiency 01/14/2015   Vitamin D deficiency 01/14/2015   Obesity 01/14/2015   Hx of iron deficiency anemia 01/14/2015   Frequent headaches 12/28/2014   Abnormal finding on MRI of brain 12/28/2014   Tobacco abuse 12/23/2014   Stress reaction 12/23/2014   Hypertension    Renal cyst    Collagenous  colitis    H/O bilateral hip replacements 06/28/2014   Malabsorption due to intolerance, not elsewhere classified 01/02/2014   Primary localized osteoarthrosis, pelvic region and thigh 10/25/2013   Lumbar disc herniation with radiculopathy 05/22/2013   Cervical spondylosis 02/06/2013   Intervertebral cervical disc disorder with myelopathy, cervical region 02/06/2013    Past Surgical History:  Procedure Laterality Date   ABDOMINAL HYSTERECTOMY  2005   partial, still has ovaries, due to fibroids.   [place]   x 4   CHOLECYSTECTOMY N/A 04/11/2019   Procedure: LAPAROSCOPIC CHOLECYSTECTOMY;  Surgeon: Ronny Bacon, MD;  Location: [facility] OR[facility];  Service: General;  Laterality: N/A;   COLONOSCOPY W/ BIOPSIES  11/01/13   collagenous colitis, sigmoid polypectomy x 2; hyperplastic polyps   CYSTECTOMY N/A 02/22/2020   Procedure: ENDOSCOPY OF ILEAL CONDUIT;  Surgeon: Alexis Frock, MD;  Location: [facility];  Service: Urology;  Laterality: N/A;   CYSTOSCOPY WITH BIOPSY N/A 11/19/2019   Procedure: CYSTOSCOPY WITH BIOPSY;  Surgeon: [name], MD;  Location: [facility];  Service: Urology;  Laterality: N/A;   CYSTOSCOPY WITH INJECTION N/A 02/20/2020   Procedure: CYSTOSCOPY WITH INJECTION OF INDOCYANINE GREEN DYE;  Surgeon: Alexis Frock, MD;  Location: [facility];  Service: Urology;  Laterality: N/A;   ESOPHAGOGASTRODUODENOSCOPY  11/22/13   chronic gastritis   HIP ARTHROPLASTY Bilateral 01/15/14,03/05/14   total hip, Dr. Nicki Reaper  [place] N/A 08/20/2018   Procedure: APPENDECTOMY LAPAROSCOPIC;  Surgeon: Fredirick Maudlin, MD;  Location: [place] [facility];  Service: General;  Laterality: N/A;   LYMPHADENECTOMY Bilateral 02/20/2020   Procedure: Noel Journey;  Surgeon: Alexis Frock, MD;  Location: [facility];  Service: Urology;  Laterality: Bilateral;   NECK SURGERY  2015   ROBOT ASSISTED LAPAROSCOPIC COMPLETE CYSTECT ILEAL CONDUIT N/A 02/20/2020   Procedure:  XI ROBOTIC ASSISTED LAPAROSCOPIC COMPLETE CYSTECT ILEAL CONDUIT AND BILATERAL OOPHORECTOMY;  Surgeon: Alexis Frock, MD;  Location: [facility];  Service: Urology;  Laterality: N/A;  6 HRS   TRANSURETHRAL RESECTION OF BLADDER TUMOR WITH MITOMYCIN-C N/A 10/29/2019   Procedure: TRANSURETHRAL RESECTION OF BLADDER TUMOR;  Surgeon: [name], MD;  Location: [facility];  Service: Urology;  Laterality: N/A;   TRANSURETHRAL RESECTION OF BLADDER TUMOR WITH MITOMYCIN-C N/A 11/19/2019   Procedure: TRANSURETHRAL RESECTION OF BLADDER TUMOR WITH Gemcitabine;  Surgeon: [name], MD;  Location: [facility];  Service: Urology;  Laterality: N/A;    Prior to Admission medications   Medication Sig Start Date End Date Taking? Authorizing Provider  ciprofloxacin (CIPRO) 500 MG tablet Take 1 tablet (500 mg total) by mouth 2 (two) times daily. 03/14/21  Yes Lavonia Drafts, MD  ondansetron (ZOFRAN) 4 MG tablet Take 1 tablet (4 mg total) by mouth daily as needed for nausea or vomiting. 03/14/21  Yes Lavonia Drafts, MD  acetaminophen (TYLENOL) 500 MG tablet Take 1,000 mg by mouth every 6 (six) hours as needed for mild pain.    [provider]  aspirin EC 81 MG tablet Take 1 tablet (81 mg total) by mouth daily. 04/13/18   Arnetha Courser, MD  atorvastatin (LIPITOR) 10 MG tablet Take 1 tablet (10 mg total) by mouth at bedtime. 09/18/20   Kathrine Haddock, NP  budesonide (ENTOCORT EC) 3 MG 24 hr capsule Take 3 capsules (9 mg total) by mouth daily. Patient taking differently: Take 3-9 mg by mouth in the morning.  08/29/19   Delsa Grana, PA-C  buPROPion (WELLBUTRIN XL) 150 MG 24 hr tablet Take 1 tablet (150 mg total) by mouth in the morning and at bedtime. Patient taking differently: Take 150 mg by mouth daily. 12/06/19   Delsa Grana, PA-C  calcium carbonate (TUMS - DOSED IN MG ELEMENTAL CALCIUM) 500 MG chewable tablet Chew 1 tablet by mouth daily as needed for indigestion or heartburn.     [provider]   metoprolol succinate (TOPROL-XL) 25 MG 24 hr tablet Take 1 tablet (25 mg total) by mouth at bedtime. 09/18/20   Kathrine Haddock, NP     Allergies Adhesive [tape], Gabapentin, Hydrocodone-acetaminophen, Keflex [cephalexin], Sulfa antibiotics, and Penicillins  Family History  Problem Relation Age of Onset   Stroke Mother    Glaucoma Mother    Heart disease Father    Cancer Father        prostate   Heart disease Sister    Diabetes Sister    Diabetes Paternal Aunt    Heart disease Paternal Aunt    Bleeding Disorder Paternal Uncle    Heart disease Sister        pace maker   Diabetes Paternal Aunt    Heart disease Paternal Aunt     Social History Social History   Tobacco Use   Smoking status: Former    Packs/day: 1.00    Years: 30.00    Pack years: 30.00    Types: Cigarettes    Quit date: 09/22/2019    Years  since quitting: 1.4   Smokeless tobacco: Never  Vaping Use   Vaping Use: Every day   Substances: Nicotine  Substance Use Topics   Alcohol use: Yes    Comment: rare   Drug use: No    Review of Systems  Constitutional: As above Eyes: No visual changes.  ENT: No sore throat. Cardiovascular: Denies chest pain. Respiratory: Denies shortness of breath. Gastrointestinal: As above Genitourinary: As above Musculoskeletal: Negative for back pain. Skin: Negative for rash. Neurological: Negative for headaches or weakness   ____________________________________________   PHYSICAL EXAM:  VITAL SIGNS: ED Triage Vitals  Enc Vitals Group     BP 03/14/21 0715 137/89     Pulse Rate 03/14/21 0715 (!) 108     Resp 03/14/21 0715 20     Temp 03/14/21 0715 98.2 F (36.8 C)     Temp Source 03/14/21 0715 Oral     SpO2 03/14/21 0715 99 %     Weight 03/14/21 0712 90.7 kg (200 lb)     Height 03/14/21 0712 1.753 m (5' 9" )     Head Circumference --      Peak Flow --      Pain Score 03/14/21 0712 10     Pain Loc --      Pain Edu? --      Excl. in [place]? --      Constitutional: Alert and oriented. No acute distress. Pleasant and interactive  Nose: No congestion/rhinnorhea. Mouth/Throat: Mucous membranes are moist.    Cardiovascular: Normal rate, regular rhythm. Grossly normal heart sounds.  Good peripheral circulation. Respiratory: Normal respiratory effort.  No retractions. Lungs CTAB. Gastrointestinal: Soft, nontender, urostomy right lower abdomen, patient reports pinkish urine this morning in bag, mild right CVA tenderness Genitourinary: deferred Musculoskeletal:   Warm and well perfused Neurologic:  Normal speech and language. No gross focal neurologic deficits are appreciated.  Skin:  Skin is warm, dry and intact. No rash noted. Psychiatric: Mood and affect are normal. Speech and behavior are normal.  ____________________________________________   LABS (all labs ordered are listed, but only abnormal results are displayed)  Labs Reviewed  COMPREHENSIVE METABOLIC PANEL - Abnormal; Notable for the following components:      Result Value   Glucose, Bld 114 (*)    Creatinine, Ser 1.29 (*)    Calcium 8.8 (*)    AST 13 (*)    GFR, Estimated 49 (*)    All other components within normal limits  CBC - Abnormal; Notable for the following components:   WBC 12.5 (*)    Hemoglobin 11.7 (*)    All other components within normal limits  URINALYSIS, ROUTINE W REFLEX MICROSCOPIC - Abnormal; Notable for the following components:   Color, Urine STRAW (*)    APPearance TURBID (*)    Hgb urine dipstick MODERATE (*)    Protein, ur 30 (*)    Nitrite POSITIVE (*)    Leukocytes,Ua LARGE (*)    All other components within normal limits  LIPASE, BLOOD  URINALYSIS, MICROSCOPIC (REFLEX)   ____________________________________________  EKG   ____________________________________________  RADIOLOGY  CT abdomen pelvis reviewed by me, no acute abnormalities noted, pending radiology  read ____________________________________________   PROCEDURES  Procedure(s) performed: No  Procedures   Critical Care performed: No ____________________________________________   INITIAL IMPRESSION / ASSESSMENT AND PLAN / ED COURSE  Pertinent labs & imaging results that were available during my care of the patient were reviewed by me and considered in my medical decision making (see  chart for details).   Patient presents with abdominal discomfort, chills as described above.  She does have a urostomy as described above.  Description of right-sided flank discomfort raises concern for possible pyelonephritis.  Urinalysis is consistent with urinary tract infection.  She does have a mildly elevated white blood cell count of 12.5  Will treat with IV fluids, morphine, Zofran, IV Cipro and obtain CT abdomen pelvis to evaluate for possible kidney stone/pyelonephritis  CT scan is reassuring per radiology  Patient is feeling much improved after treatment, offered admission however she would like to go home with p.o. antibiotics, she will return if any worsening.  This is reasonable as she looks well on re-evaluation  ____________________________________________   FINAL CLINICAL IMPRESSION(S) / ED DIAGNOSES  Final diagnoses:  Urinary tract infection with hematuria, site unspecified        Note:  This document was prepared using Dragon voice recognition software and [place] include unintentional dictation errors.    Lavonia Drafts, MD 03/14/21 [phone]

## 2021-03-14 NOTE — ED Notes (Signed)
Dc instructions and scripts reviewed with pt no questions or concerns at this time. Will follow up with pcp as needed. Pt wheeled to car and transported home by daughter.

## 2021-03-14 NOTE — ED Triage Notes (Signed)
Pt via POV from home. Pt c/o lower abd pain and nausea since last night. Pt states that she has a hx of bladder cancer. She also noted that she noticed some blood in her urine that she noticed this AM. Pt is A&OX4 and NAD

## 2021-03-20 ENCOUNTER — Ambulatory Visit (INDEPENDENT_AMBULATORY_CARE_PROVIDER_SITE_OTHER): Payer: 59 | Admitting: Nurse Practitioner

## 2021-03-20 ENCOUNTER — Encounter: Payer: Self-pay | Admitting: Nurse Practitioner

## 2021-03-20 VITALS — BP 126/78 | HR 96 | Temp 98.2°F | Ht 68.0 in | Wt 241.4 lb

## 2021-03-20 DIAGNOSIS — Z1211 Encounter for screening for malignant neoplasm of colon: Secondary | ICD-10-CM

## 2021-03-20 DIAGNOSIS — I10 Essential (primary) hypertension: Secondary | ICD-10-CM

## 2021-03-20 DIAGNOSIS — E782 Mixed hyperlipidemia: Secondary | ICD-10-CM | POA: Diagnosis not present

## 2021-03-20 DIAGNOSIS — Z87891 Personal history of nicotine dependence: Secondary | ICD-10-CM

## 2021-03-20 DIAGNOSIS — Z0000 Encounter for general adult medical examination without abnormal findings: Secondary | ICD-10-CM | POA: Diagnosis not present

## 2021-03-20 DIAGNOSIS — Z1231 Encounter for screening mammogram for malignant neoplasm of breast: Secondary | ICD-10-CM

## 2021-03-20 NOTE — Progress Notes (Signed)
Name: Susan Snyder   MRN: [id number]    DOB: 08-06-1965   Date:03/20/2021       Progress Note  Subjective  Chief Complaint  Chief Complaint  Patient presents with   Annual Exam    HPI  Patient presents for annual CPE, here alone  Diet: Well balanced diet, trying to reduce sugar intake Exercise: walk, decreased since having bladder removed.   [place] Office Visit from 12/06/2019 in [facility]  AUDIT-C Score 0      Stress:  she says it has been a stressful year with her best friend recently died.  She has cat that helps, and she is going to the beach.   Depression: Phq 9 is  negative Depression screen [facility] 2/9 03/20/2021 09/18/2020 12/06/2019 08/29/2019 07/24/2019  Decreased Interest 1 2 0 0 0  Down, Depressed, Hopeless 1 2 0 0 2  PHQ - 2 Score 2 4 0 0 2  Altered sleeping 0 - - 0 2  Tired, decreased energy 0 - - 0 2  Change in appetite 0 - - 0 0  Feeling bad or failure about yourself  0 - - 0 0  Trouble concentrating 0 - - 0 0  Moving slowly or fidgety/restless 0 - - 0 0  Suicidal thoughts 0 - - 0 0  PHQ-9 Score 2 - - 0 6  Difficult doing work/chores Not difficult at all - - Not difficult at all Somewhat difficult  Some recent data might be hidden   Hypertension: BP Readings from Last 3 Encounters:  03/20/21 126/78  03/14/21 140/82  03/15/20 (!) 161/87   Obesity: Wt Readings from Last 3 Encounters:  03/20/21 241 lb 6.4 oz (109.5 kg)  03/14/21 200 lb (90.7 kg)  09/18/20 230 lb 12.8 oz (104.7 kg)   BMI Readings from Last 3 Encounters:  03/20/21 36.70 kg/m  03/14/21 29.53 kg/m  09/18/20 35.09 kg/m     Vaccines:  HPV: up to at age 90 , ask insurance if age between 68-45  Shingrix: 54-64 yo and ask insurance if covered when patient above 76 yo Pneumonia:  educated and discussed with patient. Flu: educated and discussed with patient.  Hep C Screening: 08/29/2019 STD testing and prevention (HIV/chl/gon/syphilis): 04/13/2018 Intimate partner  violence:none Sexual History : not sexual active Menstrual History/LMP/Abnormal Bleeding: hysterectomy Incontinence Symptoms: no issues, urostomy  Breast cancer:  - Last Mammogram: needs mammogram, will order - BRCA gene screening: none  Osteoporosis: Discussed high calcium and vitamin D supplementation, weight bearing exercises  Cervical cancer screening: hysterectomy  Skin cancer: Discussed monitoring for atypical lesions  Colorectal cancer: 11/01/2013, it was recommended that she have it done every five years, ordered   Lung cancer:  Low Dose CT Chest recommended if Age 38-80 years, 20 pack-year currently smoking OR have quit w/in 15years. Patient does qualify, ordered ECG: 03/15/2020  Advanced Care Planning: A voluntary discussion about advance care planning including the explanation and discussion of advance directives.  Discussed health care proxy and Living will, and the patient was able to identify a health care proxy as daughter, Yetta Flock.  Lipids: Lab Results  Component Value Date   CHOL 132 09/18/2020   CHOL 141 07/24/2019   CHOL 146 01/30/2019   Lab Results  Component Value Date   HDL 38 (L) 09/18/2020   HDL 31 (L) 07/24/2019   HDL 32 (L) 01/30/2019   Lab Results  Component Value Date   LDLCALC 66 09/18/2020   [place] 80 07/24/2019  LDLCALC 89 01/30/2019   Lab Results  Component Value Date   TRIG 214 (H) 09/18/2020   TRIG 199 (H) 07/24/2019   TRIG 153 (H) 01/30/2019   Lab Results  Component Value Date   CHOLHDL 3.5 09/18/2020   CHOLHDL 4.5 07/24/2019   CHOLHDL 4.6 01/30/2019   No results found for: LDLDIRECT  Glucose: Glucose  Date Value Ref Range Status  01/09/2014 104 (H) 65 - 99 mg/dL Final   Glucose, Bld  Date Value Ref Range Status  03/14/2021 114 (H) 70 - 99 mg/dL Final    Comment:    Glucose reference range applies only to samples taken after fasting for at least 8 hours.  09/18/2020 80 65 - 99 mg/dL Final    Comment:    .             Fasting reference interval .   03/15/2020 125 (H) 70 - 99 mg/dL Final    Comment:    Glucose reference range applies only to samples taken after fasting for at least 8 hours.    Patient Active Problem List   Diagnosis Date Noted   Bladder cancer ([place]) 02/20/2020   Mixed hyperlipidemia 12/20/2019   History of recurrent UTIs 08/01/2019   Major depressive disorder with current active episode 08/01/2019   Status post laparoscopic cholecystectomy 04/19/2019   Status post laparoscopic appendectomy 04/03/2019   Neoplasm of uncertain behavior of skin 05/20/2016   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   Vitamin B12 deficiency 01/14/2015   Vitamin D deficiency 01/14/2015   Obesity 01/14/2015   Hx of iron deficiency anemia 01/14/2015   Frequent headaches 12/28/2014   Abnormal finding on MRI of brain 12/28/2014   Tobacco abuse 12/23/2014   Stress reaction 12/23/2014   Hypertension    Renal cyst    Collagenous colitis    H/O bilateral hip replacements 06/28/2014   Malabsorption due to intolerance, not elsewhere classified 01/02/2014   Primary localized osteoarthrosis, pelvic region and thigh 10/25/2013   Lumbar disc herniation with radiculopathy 05/22/2013   Cervical spondylosis 02/06/2013   Intervertebral cervical disc disorder with myelopathy, cervical region 02/06/2013    Past Surgical History:  Procedure Laterality Date   ABDOMINAL HYSTERECTOMY  2005   partial, still has ovaries, due to fibroids.   Door   x 4   CHOLECYSTECTOMY N/A 04/11/2019   Procedure: LAPAROSCOPIC CHOLECYSTECTOMY;  Surgeon: Ronny Bacon, MD;  Location: [facility]S;  Service: General;  Laterality: N/A;   COLONOSCOPY W/ BIOPSIES  11/01/13   collagenous colitis, sigmoid polypectomy x 2; hyperplastic polyps   CYSTECTOMY N/A 02/22/2020   Procedure: ENDOSCOPY OF ILEAL CONDUIT;  Surgeon: Alexis Frock, MD;  Location: [facility];  Service: Urology;  Laterality: N/A;   CYSTOSCOPY WITH  BIOPSY N/A 11/19/2019   Procedure: CYSTOSCOPY WITH BIOPSY;  Surgeon: [name], MD;  Location: [facility];  Service: Urology;  Laterality: N/A;   CYSTOSCOPY WITH INJECTION N/A 02/20/2020   Procedure: CYSTOSCOPY WITH INJECTION OF INDOCYANINE GREEN DYE;  Surgeon: Alexis Frock, MD;  Location: [facility];  Service: Urology;  Laterality: N/A;   ESOPHAGOGASTRODUODENOSCOPY  11/22/13   chronic gastritis   HIP ARTHROPLASTY Bilateral 01/15/14,03/05/14   total hip, Dr. Nilsa Nutting   LAPAROSCOPIC APPENDECTOMY N/A 08/20/2018   Procedure: APPENDECTOMY LAPAROSCOPIC;  Surgeon: Fredirick Maudlin, MD;  Location: [facility];  Service: General;  Laterality: N/A;   LYMPHADENECTOMY Bilateral 02/20/2020   Procedure: Noel Journey;  Surgeon: Alexis Frock, MD;  Location: [facility];  Service: Urology;  Laterality: Bilateral;   NECK SURGERY  2015   ROBOT ASSISTED LAPAROSCOPIC COMPLETE CYSTECT ILEAL CONDUIT N/A 02/20/2020   Procedure: XI ROBOTIC ASSISTED LAPAROSCOPIC COMPLETE CYSTECT ILEAL CONDUIT AND BILATERAL OOPHORECTOMY;  Surgeon: Alexis Frock, MD;  Location: [facility];  Service: Urology;  Laterality: N/A;  6 HRS   TRANSURETHRAL RESECTION OF BLADDER TUMOR WITH MITOMYCIN-C N/A 10/29/2019   Procedure: TRANSURETHRAL RESECTION OF BLADDER TUMOR;  Surgeon: [name], MD;  Location: [facility];  Service: Urology;  Laterality: N/A;   TRANSURETHRAL RESECTION OF BLADDER TUMOR WITH MITOMYCIN-C N/A 11/19/2019   Procedure: TRANSURETHRAL RESECTION OF BLADDER TUMOR WITH Gemcitabine;  Surgeon: [name], MD;  Location: [facility];  Service: Urology;  Laterality: N/A;    Family History  Problem Relation Age of Onset   Stroke Mother    Glaucoma Mother    Heart disease Father    Cancer Father        prostate   Heart disease Sister    Diabetes Sister    Diabetes Paternal Aunt    Heart disease Paternal Aunt    Bleeding Disorder Paternal Uncle    Heart disease Sister        pace maker   Diabetes Paternal Aunt    Heart disease  Paternal Aunt     Social History   Socioeconomic History   Marital status: Widowed    Spouse name: Not on file   Number of children: 2   Years of education: 69   Highest education level: Some college, no degree  Occupational History   Occupation: Barista  Tobacco Use   Smoking status: Former    Packs/day: 1.00    Years: 30.00    Pack years: 30.00    Types: Cigarettes    Quit date: 09/22/2019    Years since quitting: 1.4   Smokeless tobacco: Never  Vaping Use   Vaping Use: Every day   Substances: Nicotine  Substance and Sexual Activity   Alcohol use: Yes    Comment: rare   Drug use: No   Sexual activity: Not Currently  Other Topics Concern   Not on file  Social History Narrative   Not on file   Social Determinants of Health   Financial Resource Strain: Low Risk    Difficulty of Paying Living Expenses: Not hard at all  Food Insecurity: No Food Insecurity   Worried About Charity fundraiser in the Last Year: Never true   [place] in the Last Year: Never true  Transportation Needs: No Transportation Needs   Lack of Transportation (Medical): No   Lack of Transportation (Non-Medical): No  Physical Activity: Insufficiently Active   Days of Exercise per Week: 3 days   Minutes of Exercise per Session: 20 min  Stress: Stress Concern Present   Feeling of Stress : To some extent  Social Connections: Moderately Integrated   Frequency of Communication with Friends and Family: More than three times a week   Frequency of Social Gatherings with Friends and Family: Once a week   Attends Religious Services: 1 to 4 times per year   Active Member of [employer] or Organizations: Yes   Attends Archivist Meetings: 1 to 4 times per year   Marital Status: Widowed  Human resources officer Violence: Not At Risk   Fear of Current or Ex-Partner: No   Emotionally Abused: No   Physically Abused: No   Sexually Abused: No     Current Outpatient  Medications:    acetaminophen  (TYLENOL) 500 MG tablet, Take 1,000 mg by mouth every 6 (six) hours as needed for mild pain., Disp: , Rfl:    aspirin EC 81 MG tablet, Take 1 tablet (81 mg total) by mouth daily., Disp: , Rfl:    atorvastatin (LIPITOR) 10 MG tablet, Take 1 tablet (10 mg total) by mouth at bedtime., Disp: 90 tablet, Rfl: 3   buPROPion (WELLBUTRIN XL) 150 MG 24 hr tablet, Take 1 tablet (150 mg total) by mouth in the morning and at bedtime. (Patient taking differently: Take 150 mg by mouth daily.), Disp: 180 tablet, Rfl: 3   calcium carbonate (TUMS - DOSED IN MG ELEMENTAL CALCIUM) 500 MG chewable tablet, Chew 1 tablet by mouth daily as needed for indigestion or heartburn. , Disp: , Rfl:    ciprofloxacin (CIPRO) 500 MG tablet, Take 1 tablet (500 mg total) by mouth 2 (two) times daily., Disp: 14 tablet, Rfl: 0   metoprolol succinate (TOPROL-XL) 25 MG 24 hr tablet, Take 1 tablet (25 mg total) by mouth at bedtime., Disp: 90 tablet, Rfl: 3   metroNIDAZOLE (FLAGYL) 250 MG tablet, Take 250 mg by mouth 2 (two) times daily., Disp: , Rfl:    ondansetron (ZOFRAN) 4 MG tablet, Take 1 tablet (4 mg total) by mouth daily as needed for nausea or vomiting., Disp: 20 tablet, Rfl: 1  Allergies  Allergen Reactions   Adhesive [Tape] Other (See Comments)    Blisters   Gabapentin Nausea And Vomiting   Hydrocodone-Acetaminophen Nausea And Vomiting    hallucinations   Keflex [Cephalexin] Shortness Of Breath and Palpitations   Sulfa Antibiotics Shortness Of Breath and Palpitations   Penicillins Rash    Did it involve swelling of the face/tongue/throat, SOB, or low BP? No Did it involve sudden or severe rash/hives, skin peeling, or any reaction on the inside of your mouth or nose? No Did you need to seek medical attention at a hospital or doctor's office? No When did it last happen?      Childhood If all above answers are NO, [place] proceed with cephalosporin use.      ROS  Constitutional: Negative for fever or weight change.   Respiratory: Negative for cough and shortness of breath.   Cardiovascular: Negative for chest pain or palpitations.  Gastrointestinal: Negative for abdominal pain, no bowel changes.  Musculoskeletal: Positive for gait problem, and chronic back pain , negative joint swelling.  Skin: Negative for rash.  Neurological: Negative for dizziness or headache.  No other specific complaints in a complete review of systems (except as listed in HPI above).   Objective  Vitals:   03/20/21 1440  BP: 126/78  Pulse: 96  Temp: 98.2 F (36.8 C)  SpO2: 98%  Weight: 241 lb 6.4 oz (109.5 kg)  Height: _0  (1.727 m)    Body mass index is 36.7 kg/m.  Physical Exam  Constitutional: Patient appears well-developed and well-nourished. No distress.  HENT: Head: Normocephalic and atraumatic. Ears: B TMs ok, no erythema or effusion; Nose: Nose normal. Mouth/Throat: not done Eyes: Conjunctivae and EOM are normal. Pupils are equal, round, and reactive to light. No scleral icterus.  Neck: Normal range of motion. Neck supple. No JVD present. No thyromegaly present.  Cardiovascular: Normal rate, regular rhythm and normal heart sounds.  No murmur heard. No BLE edema. Pulmonary/Chest: Effort normal and breath sounds normal. No respiratory distress. Abdominal: Soft. Bowel sounds are normal, no distension. There is no tenderness. no masses, urostomy present  Breast: no lumps or masses, no nipple discharge or rashes FEMALE GENITALIA: not done RECTAL: not done Musculoskeletal: Normal range of motion, no joint effusions. No gross deformities Neurological: he is alert and oriented to person, place, and time. No cranial nerve deficit. Coordination, balance, strength, speech and gait are normal.  Skin: Skin is warm and dry. No rash noted. No erythema.  Psychiatric: Patient has a normal mood and affect. behavior is normal. Judgment and thought content normal.   Recent Results (from the past 2160 hour(s))  Lipase, blood      Status: None   Collection Time: 03/14/21  7:16 AM  Result Value Ref Range   Lipase 40 11 - 51 U/L    Comment: Performed at [facility], [address] [place], [place] [address]  Comprehensive metabolic panel     Status: Abnormal   Collection Time: 03/14/21  7:16 AM  Result Value Ref Range   Sodium 137 135 - 145 mmol/L   Potassium 3.5 3.5 - 5.1 mmol/L   Chloride 108 98 - 111 mmol/L   CO2 23 22 - 32 mmol/L   Glucose, Bld 114 (H) 70 - 99 mg/dL    Comment: Glucose reference range applies only to samples taken after fasting for at least 8 hours.   BUN 19 6 - 20 mg/dL   Creatinine, Ser 1.29 (H) 0.44 - 1.00 mg/dL   Calcium 8.8 (L) 8.9 - 10.3 mg/dL   Total Protein 7.4 6.5 - 8.1 g/dL   Albumin 3.8 3.5 - 5.0 g/dL   AST 13 (L) 15 - 41 U/L   ALT 15 0 - 44 U/L   Alkaline Phosphatase 86 38 - 126 U/L   Total Bilirubin 0.8 0.3 - 1.2 mg/dL   GFR, Estimated 49 (L) >60 mL/min    Comment: (NOTE) Calculated using the CKD-EPI Creatinine Equation (2021)    Anion gap 6 5 - 15    Comment: Performed at [facility], [address] [place], [place] [address]  CBC     Status: Abnormal   Collection Time: 03/14/21  7:16 AM  Result Value Ref Range   WBC 12.5 (H) 4.0 - 10.5 K/uL   RBC 4.17 3.87 - 5.11 MIL/uL   Hemoglobin 11.7 (L) 12.0 - 15.0 g/dL   HCT 37.3 36.0 - 46.0 %   MCV 89.4 80.0 - 100.0 fL   MCH 28.1 26.0 - 34.0 pg   MCHC 31.4 30.0 - 36.0 g/dL   RDW 15.0 11.5 - 15.5 %   Platelets 340 150 - 400 K/uL   nRBC 0.0 0.0 - 0.2 %    Comment: Performed at [facility], [address] [place], [place] [address]  Urinalysis, Routine w reflex microscopic     Status: Abnormal   Collection Time: 03/14/21  7:16 AM  Result Value Ref Range   Color, Urine STRAW (A) YELLOW   APPearance TURBID (A) CLEAR   Specific Gravity, Urine 1.015 1.005 - 1.030   pH 7.0 5.0 - 8.0   Glucose, UA NEGATIVE NEGATIVE mg/dL   Hgb urine dipstick MODERATE (A) NEGATIVE   Bilirubin Urine  NEGATIVE NEGATIVE   Ketones, ur NEGATIVE NEGATIVE mg/dL   Protein, ur 30 (A) NEGATIVE mg/dL   Nitrite POSITIVE (A) NEGATIVE   Leukocytes,Ua LARGE (A) NEGATIVE    Comment: Performed at [facility], [address] [place], [place] [address]  Urinalysis, Microscopic (reflex)     Status: None   Collection Time: 03/14/21  7:16 AM  Result Value Ref Range   RBC / HPF 0-5 0 - 5 RBC/hpf   WBC, UA 0-5 0 - 5 WBC/hpf   Bacteria, UA NONE SEEN NONE SEEN   Squamous Epithelial / LPF 0-5 0 - 5   Amorphous Crystal PRESENT     Comment: Performed at [facility], [address], [place], [place] [address]     Fall Risk: Fall Risk  03/20/2021 09/18/2020 12/06/2019 08/29/2019 07/24/2019  Falls in the past year? 1 1 0 0 0  Number falls in past yr: 1 0 0 0 0  Injury with Fall? 1 0 0 0 0  Risk for fall due to : Impaired balance/gait - - - -  Follow up - - Falls evaluation completed - -     Functional Status Survey: Is the patient deaf or have difficulty hearing?: No Does the patient have difficulty seeing, even when wearing glasses/contacts?: No Does the patient have difficulty concentrating, remembering, or making decisions?: No Does the patient have difficulty walking or climbing stairs?: No Does the patient have difficulty dressing or bathing?: No Does the patient have difficulty doing errands alone such as visiting a doctor's office or shopping?: No   Assessment & Plan  1. Annual physical exam  - Ambulatory referral to Gastroenterology - MM 3D SCREEN BREAST BILATERAL; Future - Lipid panel - CBC with Differential/Platelet - COMPLETE METABOLIC PANEL WITH GFR - Ambulatory Referral Lung Cancer Screening [facility]  2. Mixed hyperlipidemia -continue current treatment - Lipid panel - COMPLETE METABOLIC PANEL WITH GFR  3. Essential hypertension - continue current treatment - CBC with Differential/Platelet - COMPLETE METABOLIC PANEL WITH GFR  4. Encounter for  screening mammogram for malignant neoplasm of breast  - MM 3D SCREEN BREAST BILATERAL; Future  5. Screening for colon cancer  - Ambulatory referral to Gastroenterology  6. History of smoking  - Ambulatory Referral Lung Cancer Screening [facility]   -USPSTF grade A and B recommendations reviewed with patient; age-appropriate recommendations, preventive care, screening tests, etc discussed and encouraged; healthy living encouraged; see AVS for patient education given to patient -Discussed importance of 150 minutes of physical activity weekly, eat two servings of fish weekly, eat one serving of tree nuts ( cashews, pistachios, pecans, almonds.Marland Kitchen) every other day, eat 6 servings of fruit/vegetables daily and drink plenty of water and avoid sweet beverages.

## 2021-03-24 ENCOUNTER — Telehealth: Payer: Self-pay

## 2021-03-24 NOTE — Telephone Encounter (Signed)
CALLED PATIENT NO ANSWER LEFT VOICEMAIL FOR A CALL BACK ? ?

## 2021-03-25 ENCOUNTER — Telehealth: Payer: Self-pay

## 2021-03-25 NOTE — Telephone Encounter (Signed)
CALLED PATIENT NO ANSWER LEFT VOICEMAIL FOR A CALL BACK ? ?

## 2021-03-25 NOTE — Telephone Encounter (Signed)
CALLED PATIENT NO ANSWER LEFT VOICEMAIL FOR A CALL BACK °Letter sent °

## 2021-05-27 ENCOUNTER — Other Ambulatory Visit (HOSPITAL_COMMUNITY)
Admission: RE | Admit: 2021-05-27 | Discharge: 2021-05-27 | Disposition: A | Discharge status: Home / Self Care | Payer: 59 | Source: Ambulatory Visit | Attending: Family Medicine | Admitting: Family Medicine

## 2021-05-27 ENCOUNTER — Encounter: Payer: Self-pay | Admitting: Nurse Practitioner

## 2021-05-27 ENCOUNTER — Ambulatory Visit (INDEPENDENT_AMBULATORY_CARE_PROVIDER_SITE_OTHER): Payer: 59 | Admitting: Nurse Practitioner

## 2021-05-27 ENCOUNTER — Other Ambulatory Visit: Payer: Self-pay

## 2021-05-27 VITALS — BP 130/80 | HR 96 | Temp 98.3°F | Resp 16 | Ht 68.0 in | Wt 245.0 lb

## 2021-05-27 DIAGNOSIS — R1012 Left upper quadrant pain: Secondary | ICD-10-CM

## 2021-05-27 DIAGNOSIS — Z1211 Encounter for screening for malignant neoplasm of colon: Secondary | ICD-10-CM | POA: Diagnosis not present

## 2021-05-27 DIAGNOSIS — L989 Disorder of the skin and subcutaneous tissue, unspecified: Secondary | ICD-10-CM | POA: Diagnosis not present

## 2021-05-27 DIAGNOSIS — N898 Other specified noninflammatory disorders of vagina: Secondary | ICD-10-CM | POA: Diagnosis not present

## 2021-05-27 NOTE — Progress Notes (Signed)
? ?BP 130/80   Pulse 96   Temp 98.3 ?F (36.8 ?C) (Oral)   Resp 16   Ht 5' 8"  (1.727 m)   Wt 245 lb (111.1 kg)   SpO2 98%   BMI 37.25 kg/m?   ? ?Subjective:  ? ? [name], female    DOB: Feb 28, 1966, 56 y.o.   MRN: [id number] ? ?HPI: ?Susan Snyder is a 56 y.o. female ? ?Chief Complaint  ?Patient presents with  ? Abdominal Pain  ?  Since having her surgery for cancer  ? ?Abdominal pain: She says she has had generalized abdominal pain since her surgery 02/20/2020, for bladder cancer.  She says the pain in her LUQ started this past February. She says she is not eating much and is bloating a lot. She gets really bad LUQ pain the pain will be sharp and then will go away.  She says she is having other pains that feel like a urinary tract infection that she had around Christmas.  She says she has daily bowel movements, no constipation or diarrhea.  [place] stool scale # 4.  Recently had a CT scan in December which showed Prior cystectomy with right abdomen ileal conduit. No stones or hydronephrosis, Aortic atherosclerosis.No acute findings. Will get labs and urine culture. Patient is also due for colonoscopy so will reorder that.  ? ?Vaginal discharge: She says she has had a vaginal discharge for about a year. The oncologist has given her multiple antibiotics but it has not changed anything. Will get vaginal swabs. She is not sexually active and she has had a total hysterectomy.   ? ?Skin lesions: She says she has had some skin lesions for a while and would like to see a dermatologist.  She says she has some on her back and shoulder and one on her right hand.  Referral placed.  ? ?Relevant past medical, surgical, family and social history reviewed and updated as indicated. Interim medical history since our last visit reviewed. ?Allergies and medications reviewed and updated. ? ?Review of Systems ? ?Constitutional: Negative for fever or weight change.  ?Respiratory: Negative for cough and shortness of  breath.   ?Cardiovascular: Negative for chest pain or palpitations.  ?Gastrointestinal: Positive for abdominal pain, no bowel changes.  ?Musculoskeletal: Negative for gait problem or joint swelling.  ?Skin: Negative for rash. Skin lesions on left shoulder, back and right hand ?Neurological: Negative for dizziness or headache.  ?No other specific complaints in a complete review of systems (except as listed in HPI above).  ? ?   ?Objective:  ?  ?BP 130/80   Pulse 96   Temp 98.3 ?F (36.8 ?C) (Oral)   Resp 16   Ht 5' 8"  (1.727 m)   Wt 245 lb (111.1 kg)   SpO2 98%   BMI 37.25 kg/m?   ?Wt Readings from Last 3 Encounters:  ?05/27/21 245 lb (111.1 kg)  ?03/20/21 241 lb 6.4 oz (109.5 kg)  ?03/14/21 200 lb (90.7 kg)  ?  ?Physical Exam ? ?Constitutional: Patient appears well-developed and well-nourished. Obese  No distress.  ?HEENT: head atraumatic, normocephalic, pupils equal and reactive to light, neck supple ?Cardiovascular: Normal rate, regular rhythm and normal heart sounds.  No murmur heard. No BLE edema. ?Pulmonary/Chest: Effort normal and breath sounds normal. No respiratory distress. ?Abdominal: Soft.  There is generalized tenderness. Urostomy on RLQ ?Psychiatric: Patient has a normal mood and affect. behavior is normal. Judgment and thought content normal.  ? ?Results for  orders placed or performed during the hospital encounter of 03/14/21  ?Lipase, blood  ?Result Value Ref Range  ? Lipase 40 11 - 51 U/L  ?Comprehensive metabolic panel  ?Result Value Ref Range  ? Sodium 137 135 - 145 mmol/L  ? Potassium 3.5 3.5 - 5.1 mmol/L  ? Chloride 108 98 - 111 mmol/L  ? CO2 23 22 - 32 mmol/L  ? Glucose, Bld 114 (H) 70 - 99 mg/dL  ? BUN 19 6 - 20 mg/dL  ? Creatinine, Ser 1.29 (H) 0.44 - 1.00 mg/dL  ? Calcium 8.8 (L) 8.9 - 10.3 mg/dL  ? Total Protein 7.4 6.5 - 8.1 g/dL  ? Albumin 3.8 3.5 - 5.0 g/dL  ? AST 13 (L) 15 - 41 U/L  ? ALT 15 0 - 44 U/L  ? Alkaline Phosphatase 86 38 - 126 U/L  ? Total Bilirubin 0.8 0.3 - 1.2 mg/dL   ? GFR, Estimated 49 (L) >60 mL/min  ? Anion gap 6 5 - 15  ?CBC  ?Result Value Ref Range  ? WBC 12.5 (H) 4.0 - 10.5 K/uL  ? RBC 4.17 3.87 - 5.11 MIL/uL  ? Hemoglobin 11.7 (L) 12.0 - 15.0 g/dL  ? HCT 37.3 36.0 - 46.0 %  ? MCV 89.4 80.0 - 100.0 fL  ? MCH 28.1 26.0 - 34.0 pg  ? MCHC 31.4 30.0 - 36.0 g/dL  ? RDW 15.0 11.5 - 15.5 %  ? Platelets 340 150 - 400 K/uL  ? nRBC 0.0 0.0 - 0.2 %  ?Urinalysis, Routine w reflex microscopic  ?Result Value Ref Range  ? Color, Urine STRAW (A) YELLOW  ? APPearance TURBID (A) CLEAR  ? Specific Gravity, Urine 1.015 1.005 - 1.030  ? pH 7.0 5.0 - 8.0  ? Glucose, UA NEGATIVE NEGATIVE mg/dL  ? Hgb urine dipstick MODERATE (A) NEGATIVE  ? Bilirubin Urine NEGATIVE NEGATIVE  ? Ketones, ur NEGATIVE NEGATIVE mg/dL  ? Protein, ur 30 (A) NEGATIVE mg/dL  ? Nitrite POSITIVE (A) NEGATIVE  ? Leukocytes,Ua LARGE (A) NEGATIVE  ?Urinalysis, Microscopic (reflex)  ?Result Value Ref Range  ? RBC / HPF 0-5 0 - 5 RBC/hpf  ? WBC, UA 0-5 0 - 5 WBC/hpf  ? Bacteria, UA NONE SEEN NONE SEEN  ? Squamous Epithelial / LPF 0-5 0 - 5  ? Amorphous Crystal PRESENT   ? ?   ?Assessment & Plan:  ? ?1. Left upper quadrant abdominal pain ?-cbc ?-cmp ?- Lipase ?- Amylase ?- Urine Culture ? ?2. Vaginal discharge ? ?- Cervicovaginal ancillary only ? ?3. Skin lesion ? ?- Ambulatory referral to Dermatology ? ?4. Screening for colon cancer ? ?- Ambulatory referral to Gastroenterology  ? ?Follow up plan: ?Return if symptoms worsen or fail to improve. ? ? ? ? ? ?

## 2021-05-28 ENCOUNTER — Other Ambulatory Visit: Payer: Self-pay

## 2021-05-28 DIAGNOSIS — Z86010 Personal history of colonic polyps: Secondary | ICD-10-CM

## 2021-05-28 MED ORDER — NA SULFATE-K SULFATE-MG SULF 17.5-3.13-1.6 GM/177ML PO SOLN: 1.0000 | Freq: Once | ORAL | 0 refills | Status: AC

## 2021-05-28 NOTE — Progress Notes (Signed)
Gastroenterology Pre-Procedure Review ? ?Request Date: 06/18/2021 ?Requesting Physician: Dr. Allen Norris ? ?PATIENT REVIEW QUESTIONS: The patient responded to the following health history questions as indicated:   ? ?1. Are you having any GI issues? yes (LUQ pain) ?2. Do you have a personal history of Polyps? yes (10/2013 polyps removed) ?3. Do you have a family history of Colon Cancer or Polyps? yes (Father-polyps) ?4. Diabetes Mellitus? no ?5. Joint replacements in the past 12 months?no ?6. Major health problems in the past 3 months? Bladder cancer 2021 ?7. Any artificial heart valves, MVP, or defibrillator?no ?   ?MEDICATIONS & ALLERGIES:    ?Patient reports the following regarding taking any anticoagulation/antiplatelet therapy:   ?Plavix, Coumadin, Eliquis, Xarelto, Lovenox, Pradaxa, Brilinta, or Effient? no ?Aspirin? yes (81 mg) ? ?Patient confirms/reports the following medications:  ?Current Outpatient Medications  ?Medication Sig Dispense Refill  ? aspirin EC 81 MG tablet Take 1 tablet (81 mg total) by mouth daily.    ? atorvastatin (LIPITOR) 10 MG tablet Take 1 tablet (10 mg total) by mouth at bedtime. 90 tablet 3  ? buPROPion (WELLBUTRIN XL) 150 MG 24 hr tablet Take 1 tablet (150 mg total) by mouth in the morning and at bedtime. (Patient taking differently: Take 150 mg by mouth daily.) 180 tablet 3  ? calcium carbonate (TUMS - DOSED IN MG ELEMENTAL CALCIUM) 500 MG chewable tablet Chew 1 tablet by mouth daily as needed for indigestion or heartburn.     ? metoprolol succinate (TOPROL-XL) 25 MG 24 hr tablet Take 1 tablet (25 mg total) by mouth at bedtime. 90 tablet 3  ? ?No current facility-administered medications for this visit.  ? ? ?Patient confirms/reports the following allergies:  ?Allergies  ?Allergen Reactions  ? Adhesive [Tape] Other (See Comments)  ?  Blisters  ? Gabapentin Nausea And Vomiting  ? Hydrocodone-Acetaminophen Nausea And Vomiting  ?  hallucinations  ? Keflex [Cephalexin] Shortness Of Breath and  Palpitations  ? Sulfa Antibiotics Shortness Of Breath and Palpitations  ? Penicillins Rash  ?  Did it involve swelling of the face/tongue/throat, SOB, or low BP? No ?Did it involve sudden or severe rash/hives, skin peeling, or any reaction on the inside of your mouth or nose? No ?Did you need to seek medical attention at a hospital or doctor's office? No ?When did it last happen?      Childhood ?If all above answers are ?NO?, [place] proceed with cephalosporin use. ?  ? ? ?No orders of the defined types were placed in this encounter. ? ? ?AUTHORIZATION INFORMATION ?Primary Insurance: ?1D#: ?Group #: ? ?Secondary Insurance: ?1D#: ?Group #: ? ?SCHEDULE INFORMATION: ?Date: 06/18/2021 ?Time: ?Location: [place] ? ?

## 2021-05-29 ENCOUNTER — Other Ambulatory Visit: Payer: Self-pay | Admitting: Nurse Practitioner

## 2021-05-29 DIAGNOSIS — N390 Urinary tract infection, site not specified: Secondary | ICD-10-CM

## 2021-05-29 LAB — URINE CULTURE
MICRO NUMBER:: 13107052
SPECIMEN QUALITY:: ADEQUATE

## 2021-05-29 LAB — CBC WITH DIFFERENTIAL/PLATELET
Absolute Monocytes: 443 cells/uL (ref 200–950)
Basophils Absolute: 43 cells/uL (ref 0–200)
Basophils Relative: 0.4 %
Eosinophils Absolute: 173 cells/uL (ref 15–500)
Eosinophils Relative: 1.6 %
HCT: 37.6 % (ref 35.0–45.0)
Hemoglobin: 12 g/dL (ref 11.7–15.5)
Lymphs Abs: 2884 cells/uL (ref 850–3900)
MCH: 28 pg (ref 27.0–33.0)
MCHC: 31.9 g/dL — ABNORMAL LOW (ref 32.0–36.0)
MCV: 87.6 fL (ref 80.0–100.0)
MPV: 10.8 fL (ref 7.5–12.5)
Monocytes Relative: 4.1 %
Neutro Abs: 7258 cells/uL (ref 1500–7800)
Neutrophils Relative %: 67.2 %
Platelets: 345 10*3/uL (ref 140–400)
RBC: 4.29 10*6/uL (ref 3.80–5.10)
RDW: 14.2 % (ref 11.0–15.0)
Total Lymphocyte: 26.7 %
WBC: 10.8 10*3/uL (ref 3.8–10.8)

## 2021-05-29 LAB — CERVICOVAGINAL ANCILLARY ONLY
Bacterial Vaginitis (gardnerella): NEGATIVE
Candida Glabrata: NEGATIVE
Candida Vaginitis: NEGATIVE
Chlamydia: NEGATIVE
Comment: NEGATIVE
Comment: NEGATIVE
Comment: NEGATIVE
Comment: NEGATIVE
Comment: NEGATIVE
Comment: NORMAL
Neisseria Gonorrhea: NEGATIVE
Trichomonas: NEGATIVE

## 2021-05-29 LAB — COMPLETE METABOLIC PANEL WITH GFR
AG Ratio: 1.4 (calc) (ref 1.0–2.5)
ALT: 11 U/L (ref 6–29)
AST: 13 U/L (ref 10–35)
Albumin: 4 g/dL (ref 3.6–5.1)
Alkaline phosphatase (APISO): 87 U/L (ref 37–153)
BUN/Creatinine Ratio: 13 (calc) (ref 6–22)
BUN: 17 mg/dL (ref 7–25)
CO2: 27 mmol/L (ref 20–32)
Calcium: 9.6 mg/dL (ref 8.6–10.4)
Chloride: 106 mmol/L (ref 98–110)
Creat: 1.32 mg/dL — ABNORMAL HIGH (ref 0.50–1.03)
Globulin: 2.9 g/dL (calc) (ref 1.9–3.7)
Glucose, Bld: 86 mg/dL (ref 65–99)
Potassium: 4.7 mmol/L (ref 3.5–5.3)
Sodium: 142 mmol/L (ref 135–146)
Total Bilirubin: 0.3 mg/dL (ref 0.2–1.2)
Total Protein: 6.9 g/dL (ref 6.1–8.1)
eGFR: 47 mL/min/{1.73_m2} — ABNORMAL LOW (ref >=60)

## 2021-05-29 LAB — LIPID PANEL
Cholesterol: 127 mg/dL (ref <200)
HDL: 35 mg/dL — ABNORMAL LOW (ref >=50)
LDL Cholesterol (Calc): 69 mg/dL (calc)
Non-HDL Cholesterol (Calc): 92 mg/dL (calc) (ref <130)
Total CHOL/HDL Ratio: 3.6 (calc) (ref <5.0)
Triglycerides: 152 mg/dL — ABNORMAL HIGH (ref <150)

## 2021-05-29 LAB — AMYLASE: Amylase: 35 U/L (ref 21–101)

## 2021-05-29 LAB — LIPASE: Lipase: 43 U/L (ref 7–60)

## 2021-05-29 MED ORDER — NITROFURANTOIN MONOHYD MACRO 100 MG PO CAPS: 100.0000 mg | ORAL_CAPSULE | Freq: Two times a day (BID) | ORAL | 0 refills | Status: AC

## 2021-06-01 ENCOUNTER — Encounter: Payer: Self-pay | Admitting: Nurse Practitioner

## 2021-06-01 ENCOUNTER — Ambulatory Visit: Payer: Self-pay | Admitting: *Deleted

## 2021-06-01 NOTE — Telephone Encounter (Signed)
Called patient to review lab results and patient would like to review with PCP regarding citrobacter braaki bacteria. Patient would like to know if this Willow be causing her stomach issues/ pains and could cause sepsis .. please advise . ?

## 2021-06-01 NOTE — Telephone Encounter (Signed)
Summary: Lab results, call back request  ? Pt has questions for Susan Snyder regarding test results, wants to speak to clinic. Please advise  ?Best contact: [phone]  ? ?----- Message from Erick Blinks sent at 06/01/2021 10:04 AM EDT -----  ?Pt has questions for Susan Snyder regarding test results, wants to speak to clinic. Please advise  ?Best contact: [phone]   ?  ? ? ?Called patient to review lab results. No answer, LVMTCB [phone]. ?

## 2021-06-01 NOTE — Telephone Encounter (Signed)
Left message to call back  

## 2021-06-02 ENCOUNTER — Other Ambulatory Visit: Payer: Self-pay | Admitting: Nurse Practitioner

## 2021-06-02 DIAGNOSIS — R1012 Left upper quadrant pain: Secondary | ICD-10-CM

## 2021-06-02 NOTE — Telephone Encounter (Signed)
FYI

## 2021-06-09 ENCOUNTER — Encounter: Payer: Self-pay | Admitting: Nurse Practitioner

## 2021-06-11 ENCOUNTER — Encounter: Payer: Self-pay | Admitting: Gastroenterology

## 2021-06-18 ENCOUNTER — Encounter: Payer: Self-pay | Admitting: Gastroenterology

## 2021-06-18 ENCOUNTER — Ambulatory Visit
Admission: RE | Admit: 2021-06-18 | Discharge: 2021-06-18 | Disposition: A | Discharge status: Home / Self Care | Payer: 59 | Attending: Gastroenterology | Admitting: Gastroenterology

## 2021-06-18 ENCOUNTER — Ambulatory Visit: Payer: 59 | Admitting: Anesthesiology

## 2021-06-18 ENCOUNTER — Other Ambulatory Visit: Payer: Self-pay

## 2021-06-18 ENCOUNTER — Encounter
Admission: RE | Disposition: A | Discharge status: Home / Self Care | Payer: Self-pay | Source: Home / Self Care | Attending: Gastroenterology

## 2021-06-18 DIAGNOSIS — I10 Essential (primary) hypertension: Secondary | ICD-10-CM | POA: Insufficient documentation

## 2021-06-18 DIAGNOSIS — Z87891 Personal history of nicotine dependence: Secondary | ICD-10-CM | POA: Insufficient documentation

## 2021-06-18 DIAGNOSIS — K635 Polyp of colon: Secondary | ICD-10-CM

## 2021-06-18 DIAGNOSIS — K641 Second degree hemorrhoids: Secondary | ICD-10-CM | POA: Diagnosis not present

## 2021-06-18 DIAGNOSIS — Z86010 Personal history of colonic polyps: Secondary | ICD-10-CM

## 2021-06-18 DIAGNOSIS — Z1211 Encounter for screening for malignant neoplasm of colon: Secondary | ICD-10-CM | POA: Insufficient documentation

## 2021-06-18 DIAGNOSIS — K620 Anal polyp: Secondary | ICD-10-CM | POA: Diagnosis not present

## 2021-06-18 DIAGNOSIS — D123 Benign neoplasm of transverse colon: Secondary | ICD-10-CM | POA: Diagnosis not present

## 2021-06-18 HISTORY — PX: COLONOSCOPY WITH PROPOFOL: SHX5780

## 2021-06-18 HISTORY — DX: Other artificial openings of urinary tract status: Z93.6

## 2021-06-18 HISTORY — PX: POLYPECTOMY: SHX5525

## 2021-06-18 SURGERY — COLONOSCOPY WITH PROPOFOL
Anesthesia: General | Site: Rectum

## 2021-06-18 MED ORDER — SODIUM CHLORIDE 0.9 % IV SOLN: INTRAVENOUS | Status: DC

## 2021-06-18 MED ORDER — LIDOCAINE HCL (CARDIAC) PF 100 MG/5ML IV SOSY
PREFILLED_SYRINGE | INTRAVENOUS | Status: DC | PRN
  Administered 2021-06-18: 30 mg via INTRAVENOUS

## 2021-06-18 MED ORDER — PROPOFOL 10 MG/ML IV BOLUS
INTRAVENOUS | Status: DC | PRN
  Administered 2021-06-18 (×4): 40 mg via INTRAVENOUS
  Administered 2021-06-18: 50 mg via INTRAVENOUS
  Administered 2021-06-18: 100 mg via INTRAVENOUS
  Administered 2021-06-18: 20 mg via INTRAVENOUS

## 2021-06-18 MED ORDER — STERILE WATER FOR IRRIGATION IR SOLN
Status: DC | PRN
  Administered 2021-06-18: 250 mL

## 2021-06-18 MED ORDER — LACTATED RINGERS IV SOLN: INTRAVENOUS | Status: DC

## 2021-06-18 MED ORDER — STERILE WATER FOR IRRIGATION IR SOLN
Status: DC | PRN
  Administered 2021-06-18: .05 mL

## 2021-06-18 SURGICAL SUPPLY — 9 items
FORCEPS BIOP RAD 4 LRG CAP 4 (CUTTING FORCEPS) ×1 IMPLANT
GOWN CVR UNV OPN BCK APRN NK (MISCELLANEOUS) ×4 IMPLANT
GOWN ISOL THUMB LOOP REG UNIV (MISCELLANEOUS) ×6
KIT PRC NS LF DISP ENDO (KITS) ×2 IMPLANT
KIT PROCEDURE OLYMPUS (KITS) ×3
MANIFOLD NEPTUNE II (INSTRUMENTS) ×3 IMPLANT
SNARE COLD EXACTO (MISCELLANEOUS) ×1 IMPLANT
TRAP ETRAP POLY (MISCELLANEOUS) ×1 IMPLANT
WATER STERILE IRR 250ML POUR (IV SOLUTION) ×3 IMPLANT

## 2021-06-18 NOTE — H&P (Signed)
? ?Lucilla Lame, MD [facility] ?[address] ?[place], [place] [address] ?Phone:[phone] ?Fax : [phone] ? ?Primary Care Physician:  Delsa Grana, PA-C ?Primary Gastroenterologist:  Dr. Allen Norris ? ?Pre-Procedure History & Physical: ?HPI:  Susan Snyder is a 56 y.o. female is here for an colonoscopy. ?  ?Past Medical History:  ?Diagnosis Date  ? Anemia   ? Cancer [facility])   ? bladder  ? CCC (chronic calculous cholecystitis) 04/03/2019  ? Chronic diarrhea   ? Chronic gastritis 11/2013  ? on EGD   ? Collagenous colitis   ? colonoscopy biopsies 11/01/13  ? Complication of anesthesia   ? DDD (degenerative disc disease), cervical   ? GERD (gastroesophageal reflux disease)   ? History of kidney stones   ? Hyperplastic polyps of stomach   ? Aug 2015  ? Hypertension   ? Neoplasm of uncertain behavior of skin 05/20/2016  ? Hx of skin cancer, BCC  ? PONV (postoperative nausea and vomiting)   ? only with her hysterectomy  ? Presence of urostomy [facility])   ? ? ?Past Surgical History:  ?Procedure Laterality Date  ? ABDOMINAL HYSTERECTOMY  2005  ? partial, still has ovaries, due to fibroids.  ? APPENDECTOMY    ? [place]  ? x 4  ? CHOLECYSTECTOMY N/A 04/11/2019  ? Procedure: LAPAROSCOPIC CHOLECYSTECTOMY;  Surgeon: Ronny Bacon, MD;  Location: [facility] ORS;  Service: General;  Laterality: N/A;  ? COLONOSCOPY W/ BIOPSIES  11/01/13  ? collagenous colitis, sigmoid polypectomy x 2; hyperplastic polyps  ? CYSTECTOMY N/A 02/22/2020  ? Procedure: ENDOSCOPY OF ILEAL CONDUIT;  Surgeon: Alexis Frock, MD;  Location: [facility] ORS;  Service: Urology;  Laterality: N/A;  ? CYSTOSCOPY WITH BIOPSY N/A 11/19/2019  ? Procedure: CYSTOSCOPY WITH BIOPSY;  Surgeon: [name], MD;  Location: [facility] ORS;  Service: Urology;  Laterality: N/A;  ? CYSTOSCOPY WITH INJECTION N/A 02/20/2020  ? Procedure: CYSTOSCOPY WITH INJECTION OF INDOCYANINE GREEN DYE;  Surgeon: Alexis Frock, MD;  Location: [facility] ORS;  Service: Urology;  Laterality: N/A;  ?  ESOPHAGOGASTRODUODENOSCOPY  11/22/13  ? chronic gastritis  ? HIP ARTHROPLASTY Bilateral 01/15/14,03/05/14  ? total hip, Dr. Nilsa Nutting  ? LAPAROSCOPIC APPENDECTOMY N/A 08/20/2018  ? Procedure: APPENDECTOMY LAPAROSCOPIC;  Surgeon: Fredirick Maudlin, MD;  Location: [facility] ORS;  Service: General;  Laterality: N/A;  ? LYMPHADENECTOMY Bilateral 02/20/2020  ? Procedure: LYMPHADENECTOMY;  Surgeon: Alexis Frock, MD;  Location: [facility] ORS;  Service: Urology;  Laterality: Bilateral;  ? NECK SURGERY  2015  ? ROBOT ASSISTED LAPAROSCOPIC COMPLETE CYSTECT ILEAL CONDUIT N/A 02/20/2020  ? Procedure: XI ROBOTIC ASSISTED LAPAROSCOPIC COMPLETE CYSTECT ILEAL CONDUIT AND BILATERAL OOPHORECTOMY;  Surgeon: Alexis Frock, MD;  Location: [facility] OR[facility];  Service: Urology;  Laterality: N/A;  6 HRS  ? TRANSURETHRAL RESECTION OF BLADDER TUMOR WITH MITOMYCIN-C N/A 10/29/2019  ? Procedure: TRANSURETHRAL RESECTION OF BLADDER TUMOR;  Surgeon: [name], MD;  Location: [facility] ORS;  Service: Urology;  Laterality: N/A;  ? TRANSURETHRAL RESECTION OF BLADDER TUMOR WITH MITOMYCIN-C N/A 11/19/2019  ? Procedure: TRANSURETHRAL RESECTION OF BLADDER TUMOR WITH Gemcitabine;  Surgeon: [name], MD;  Location: [facility] ORS;  Service: Urology;  Laterality: N/A;  ? ? ?Prior to Admission medications   ?Medication Sig Start Date End Date Taking? Authorizing Provider  ?aspirin EC 81 MG tablet Take 1 tablet (81 mg total) by mouth daily. 04/13/18  Yes Lada, Satira Anis, MD  ?atorvastatin (LIPITOR) 10 MG tablet Take 1 tablet (10 mg total) by mouth at  bedtime. 09/18/20  Yes Kathrine Haddock, NP  ?buPROPion (WELLBUTRIN XL) 150 MG 24 hr tablet Take 1 tablet (150 mg total) by mouth in the morning and at bedtime. ?Patient taking differently: Take 150 mg by mouth daily. 12/06/19  Yes Delsa Grana, PA-C  ?calcium carbonate (TUMS - DOSED IN MG ELEMENTAL CALCIUM) 500 MG chewable tablet Chew 1 tablet by mouth daily as needed for indigestion or heartburn.    Yes [provider]   ?Cyanocobalamin (VITAMIN B-12 PO) Take by mouth.   Yes [provider]  ?metoprolol succinate (TOPROL-XL) 25 MG 24 hr tablet Take 1 tablet (25 mg total) by mouth at bedtime. 09/18/20  Yes Kathrine Haddock, NP  ?VITAMIN D PO Take by mouth.   Yes [provider]  ? ? ?Allergies as of 05/28/2021 - Review Complete 05/27/2021  ?Allergen Reaction Noted  ? Adhesive [tape] Other (See Comments) 12/19/2014  ? Gabapentin Nausea And Vomiting 01/09/2013  ? Hydrocodone-acetaminophen Nausea And Vomiting 10/12/2019  ? Keflex [cephalexin] Shortness Of Breath and Palpitations 12/19/2014  ? Sulfa antibiotics Shortness Of Breath and Palpitations 12/19/2014  ? Penicillins Rash 12/19/2014  ? ? ?Family History  ?Problem Relation Age of Onset  ? Stroke Mother   ? Glaucoma Mother   ? Heart disease Father   ? Cancer Father   ?     prostate  ? Heart disease Sister   ? Diabetes Sister   ? Diabetes Paternal Aunt   ? Heart disease Paternal Aunt   ? Bleeding Disorder Paternal Uncle   ? Heart disease Sister   ?     Psychologist, forensic  ? Diabetes Paternal Aunt   ? Heart disease Paternal Aunt   ? ? ?Social History  ? ?Socioeconomic History  ? Marital status: Widowed  ?  Spouse name: Not on file  ? Number of children: 2  ? Years of education: 61  ? Highest education level: Some college, no degree  ?Occupational History  ? Occupation: Barista  ?Tobacco Use  ? Smoking status: Former  ?  Packs/day: 1.00  ?  Years: 30.00  ?  Pack years: 30.00  ?  Types: Cigarettes  ?  Quit date: 09/22/2019  ?  Years since quitting: 1.7  ? Smokeless tobacco: Never  ?Vaping Use  ? Vaping Use: Former  ? Substances: Nicotine  ?Substance and Sexual Activity  ? Alcohol use: Yes  ?  Comment: rare  ? Drug use: No  ? Sexual activity: Not Currently  ?Other Topics Concern  ? Not on file  ?Social History Narrative  ? Not on file  ? ?Social Determinants of Health  ? ?Financial Resource Strain: Low Risk   ? Difficulty of Paying Living Expenses: Not hard at all  ?Food  Insecurity: No Food Insecurity  ? Worried About Charity fundraiser in the Last Year: Never true  ? Ran Out of Food in the Last Year: Never true  ?Transportation Needs: No Transportation Needs  ? Lack of Transportation (Medical): No  ? Lack of Transportation (Non-Medical): No  ?Physical Activity: Insufficiently Active  ? Days of Exercise per Week: 3 days  ? Minutes of Exercise per Session: 20 min  ?Stress: Stress Concern Present  ? Feeling of Stress : To some extent  ?Social Connections: Moderately Integrated  ? Frequency of Communication with Friends and Family: More than three times a week  ? Frequency of Social Gatherings with Friends and Family: Once a week  ? Attends Religious Services: 1 to 4  times per year  ? Active Member of Clubs or Organizations: Yes  ? Attends Archivist Meetings: 1 to 4 times per year  ? Marital Status: Widowed  ?Intimate Partner Violence: Not At Risk  ? Fear of Current or Ex-Partner: No  ? Emotionally Abused: No  ? Physically Abused: No  ? Sexually Abused: No  ? ? ?Review of Systems: ?See HPI, otherwise negative ROS ? ?Physical Exam: ?BP (!) 151/100   Pulse (!) 113   Temp 97.8 ?F (36.6 ?C) (Temporal)   Resp 18   Ht 5' 8"  (1.727 m)   Wt 109 kg   SpO2 98%   BMI 36.54 kg/m?  ?General:   Alert,  pleasant and cooperative in NAD ?Head:  Normocephalic and atraumatic. ?Neck:  Supple; no masses or thyromegaly. ?Lungs:  Clear throughout to auscultation.    ?Heart:  Regular rate and rhythm. ?Abdomen:  Soft, nontender and nondistended. Normal bowel sounds, without guarding, and without rebound.   ?Neurologic:  Alert and  oriented x4;  grossly normal neurologically. ? ?Impression/Plan: ?Susan Snyder is here for an colonoscopy to be performed for a history of adenomatous polyps on 2015 ? ? ?Risks, benefits, limitations, and alternatives regarding  colonoscopy have been reviewed with the patient.  Questions have been answered.  All parties agreeable. ? ? ?Lucilla Lame, MD  06/18/2021,  8:25 AM ?

## 2021-06-18 NOTE — Transfer of Care (Signed)
Immediate Anesthesia Transfer of Care Note ? ?Patient: Susan Snyder ? ?Procedure(s) Performed: COLONOSCOPY WITH PROPOFOL (Rectum) ?POLYPECTOMY (Rectum) ? ?Patient Location: PACU ? ?Anesthesia Type: General ? ?Level of Consciousness: awake, alert  and patient cooperative ? ?Airway and Oxygen Therapy: Patient Spontanous Breathing and Patient connected to supplemental oxygen ? ?Post-op Assessment: Post-op Vital signs reviewed, Patient's Cardiovascular Status Stable, Respiratory Function Stable, Patent Airway and No signs of Nausea or vomiting ? ?Post-op Vital Signs: Reviewed and stable ? ?Complications: No notable events documented. ? ?

## 2021-06-18 NOTE — Op Note (Signed)
[facility] ?Gastroenterology ?Patient Name: Susan Snyder ?Procedure Date: 06/18/2021 8:51 AM ?MRN: [id number] ?Account #: [id number] ?Date of Birth: 21-Oct-1965 ?Admit Type: Outpatient ?Age: 56 ?Room: [facility] OR ROOM 01 ?Gender: Female ?Note Status: Finalized ?Instrument Name: [id number] ?Procedure:             Colonoscopy ?Indications:           High risk colon cancer surveillance: Personal history  ?                       of colonic polyps ?Providers:             Lucilla Lame MD, MD ?Referring MD:          Delsa Grana (Referring MD) ?Medicines:             Propofol per Anesthesia ?Complications:         No immediate complications. ?Procedure:             Pre-Anesthesia Assessment: ?                       - Prior to the procedure, a History and Physical was  ?                       performed, and patient medications and allergies were  ?                       reviewed. The patient's tolerance of previous  ?                       anesthesia was also reviewed. The risks and benefits  ?                       of the procedure and the sedation options and risks  ?                       were discussed with the patient. All questions were  ?                       answered, and informed consent was obtained. Prior  ?                       Anticoagulants: The patient has taken no previous  ?                       anticoagulant or antiplatelet agents. ASA Grade  ?                       Assessment: II - A patient with mild systemic disease.  ?                       After reviewing the risks and benefits, the patient  ?                       was deemed in satisfactory condition to undergo the  ?                       procedure. ?  After obtaining informed consent, the colonoscope was  ?                       passed under direct vision. Throughout the procedure,  ?                       the patient's blood pressure, pulse, and oxygen  ?                       saturations were monitored continuously.  The  ?                       Colonoscope was introduced through the anus and  ?                       advanced to the the cecum, identified by appendiceal  ?                       orifice and ileocecal valve. The colonoscopy was  ?                       performed without difficulty. The patient tolerated  ?                       the procedure well. The quality of the bowel  ?                       preparation was excellent. ?Findings: ?     The perianal and digital rectal examinations were normal. ?     A 5 mm polyp was found in the transverse colon. The polyp was sessile.  ?     The polyp was removed with a cold snare. Resection and retrieval were  ?     complete. ?     A 9 mm polyp was found in the anus. The polyp was pedunculated. Biopsies  ?     were taken with a cold forceps for histology. ?     Non-bleeding internal hemorrhoids were found during retroflexion. The  ?     hemorrhoids were Grade II (internal hemorrhoids that prolapse but reduce  ?     spontaneously). ?Impression:            - One 5 mm polyp in the transverse colon, removed with  ?                       a cold snare. Resected and retrieved. ?                       - One 9 mm polyp at the anus. Biopsied. ?                       - Non-bleeding internal hemorrhoids. ?Recommendation:        - Discharge patient to home. ?                       - Resume previous diet. ?                       - Continue present medications. ?                       -  Await pathology results. ?                       - Repeat colonoscopy in 7 years for surveillance. ?Procedure Code(s):     --- Professional --- ?                       [phone], Colonoscopy, flexible; with removal of  ?                       tumor(s), polyp(s), or other lesion(s) by snare  ?                       technique ?                       [id number], 59, Colonoscopy, flexible; with biopsy, single  ?                       or multiple ?Diagnosis Code(s):     --- Professional --- ?                       Z86.010,  Personal history of colonic polyps ?                       K63.5, Polyp of colon ?CPT copyright 2019 American Medical Association. All rights reserved. ?The codes documented in this report are preliminary and upon coder review [place]  ?be revised to meet current compliance requirements. ?Lucilla Lame MD, MD ?06/18/2021 9:15:13 AM ?This report has been signed electronically. ?Number of Addenda: 0 ?Note Initiated On: 06/18/2021 8:51 AM ?Scope Withdrawal Time: 0 hours 8 minutes 37 seconds  ?Total Procedure Duration: 0 hours 10 minutes 27 seconds  ?Estimated Blood Loss:  Estimated blood loss: none. ?     [facility] ?

## 2021-06-18 NOTE — Anesthesia Postprocedure Evaluation (Signed)
Anesthesia Post Note ? ?Patient: Susan Snyder ? ?Procedure(s) Performed: COLONOSCOPY WITH PROPOFOL (Rectum) ?POLYPECTOMY (Rectum) ? ? ?  ?Patient location during evaluation: PACU ?Anesthesia Type: General ?Level of consciousness: awake and alert ?Pain management: pain level controlled ?Vital Signs Assessment: post-procedure vital signs reviewed and stable ?Respiratory status: spontaneous breathing, nonlabored ventilation, respiratory function stable and patient connected to nasal cannula oxygen ?Cardiovascular status: blood pressure returned to baseline and stable ?Postop Assessment: no apparent nausea or vomiting ?Anesthetic complications: no ? ? ?No notable events documented. ? ?Adele Barthel Jammie Clink ? ? ? ? ? ?

## 2021-06-18 NOTE — Anesthesia Preprocedure Evaluation (Signed)
Anesthesia Evaluation  ?Patient identified by MRN, date of birth, ID band ? ?History of Anesthesia Complications ?(+) PONV and history of anesthetic complications ? ?Airway ?Mallampati: I ? ?TM Distance: >3 FB ?Neck ROM: Full ? ? ? Dental ?no notable dental hx. ? ?  ?Pulmonary ?former smoker,  ?  ?Pulmonary exam normal ? ? ? ? ? ? ? Cardiovascular ?Exercise Tolerance: Good ?hypertension, Pt. on home beta blockers and Pt. on medications ?Normal cardiovascular exam ? ? ?  ?Neuro/Psych ?negative neurological ROS ? negative psych ROS  ? GI/Hepatic ?negative GI ROS, Neg liver ROS,   ?Endo/Other  ?BMI 36 ? Renal/GU ?negative Renal ROS  ? ?  ?Musculoskeletal ?negative musculoskeletal ROS ?(+)  ? Abdominal ?  ?Peds ? Hematology ?negative hematology ROS ?(+)   ?Anesthesia Other Findings ? ? Reproductive/Obstetrics ? ?  ? ? ? ? ? ? ? ? ? ? ? ? ? ?  ?  ? ? ? ? ? ? ? ?Anesthesia Physical ?Anesthesia Plan ? ?ASA: 2 ? ?Anesthesia Plan: General  ? ?Post-op Pain Management: Minimal or no pain anticipated  ? ?Induction: Intravenous ? ?PONV Risk Score and Plan: 4 or greater and Propofol infusion, TIVA and Treatment Tiemann vary due to age or medical condition ? ?Airway Management Planned: Nasal Cannula and Natural Airway ? ?Additional Equipment: None ? ?Intra-op Plan:  ? ?Post-operative Plan:  ? ?Informed Consent: I have reviewed the patients History and Physical, chart, labs and discussed the procedure including the risks, benefits and alternatives for the proposed anesthesia with the patient or authorized representative who has indicated his/her understanding and acceptance.  ? ? ? ? ? ?Plan Discussed with: CRNA ? ?Anesthesia Plan Comments:   ? ? ? ? ? ? ?Anesthesia Quick Evaluation ? ?

## 2021-06-18 NOTE — Anesthesia Procedure Notes (Signed)
Date/Time: 06/18/2021 9:08 AM ?Performed by: Cameron Ali, CRNA ?Pre-anesthesia Checklist: Patient identified, Emergency Drugs available, Suction available, Timeout performed and Patient being monitored ?Patient Re-evaluated:Patient Re-evaluated prior to induction ?Oxygen Delivery Method: Nasal cannula ?Placement Confirmation: positive ETCO2 ? ? ? ? ?

## 2021-06-19 ENCOUNTER — Encounter: Payer: Self-pay | Admitting: Gastroenterology

## 2021-06-19 LAB — SURGICAL PATHOLOGY

## 2021-06-22 ENCOUNTER — Encounter: Payer: Self-pay | Admitting: Gastroenterology

## 2021-07-21 ENCOUNTER — Ambulatory Visit: Payer: Self-pay

## 2021-07-21 NOTE — Telephone Encounter (Signed)
? ? ?  Chief Complaint: Back, abdominal pain,cloudy urine. ?Symptoms: Thinks it is UTI. ?Frequency: Started Saturday. ?Pertinent Negatives: Patient denies fever. ?Disposition: [] ED /[] Urgent Care (no appt availability in office) / [x] Appointment(In office/virtual)/ []  [place] [place] [facility]/ [] Home Care/ [] Refused Recommended Disposition /[] [place] [facility]/ []  Follow-up with PCP ?Additional Notes: Go to ED for worsening of symptoms.  ?Reason for Disposition ? Side (flank) or lower back pain present ? ?Answer Assessment - Initial Assessment Questions ?1. SYMPTOM: "What's the main symptom you're concerned about?" (e.g., frequency, incontinence) ?    Back and abdominal pain, rash around urostomy ?2. ONSET: "When did the  symptoms  start?" ?    Saturday ?3. PAIN: "Is there any pain?" If Yes, ask: "How bad is it?" (Scale: 1-10; mild, moderate, severe) ?    7-8 ?4. CAUSE: "What do you think is causing the symptoms?" ?    UTI ?5. OTHER SYMPTOMS: "Do you have any other symptoms?" (e.g., fever, flank pain, blood in urine, pain with urination) ?    No ?6. PREGNANCY: "Is there any chance you are pregnant?" "When was your last menstrual period?" ?    No ? ?Protocols used: Urinary Symptoms-A-AH ? ?

## 2021-07-22 ENCOUNTER — Encounter: Payer: Self-pay | Admitting: Family Medicine

## 2021-07-22 ENCOUNTER — Ambulatory Visit (INDEPENDENT_AMBULATORY_CARE_PROVIDER_SITE_OTHER): Payer: 59 | Admitting: Family Medicine

## 2021-07-22 VITALS — BP 136/78 | HR 91 | Resp 16 | Ht 68.0 in | Wt 249.0 lb

## 2021-07-22 DIAGNOSIS — F321 Major depressive disorder, single episode, moderate: Secondary | ICD-10-CM

## 2021-07-22 DIAGNOSIS — Z90710 Acquired absence of both cervix and uterus: Secondary | ICD-10-CM

## 2021-07-22 DIAGNOSIS — K9049 Malabsorption due to intolerance, not elsewhere classified: Secondary | ICD-10-CM

## 2021-07-22 DIAGNOSIS — E559 Vitamin D deficiency, unspecified: Secondary | ICD-10-CM

## 2021-07-22 DIAGNOSIS — E8941 Symptomatic postprocedural ovarian failure: Secondary | ICD-10-CM

## 2021-07-22 DIAGNOSIS — N390 Urinary tract infection, site not specified: Secondary | ICD-10-CM | POA: Diagnosis not present

## 2021-07-22 DIAGNOSIS — N761 Subacute and chronic vaginitis: Secondary | ICD-10-CM

## 2021-07-22 DIAGNOSIS — R109 Unspecified abdominal pain: Secondary | ICD-10-CM

## 2021-07-22 DIAGNOSIS — Z5181 Encounter for therapeutic drug level monitoring: Secondary | ICD-10-CM

## 2021-07-22 DIAGNOSIS — K52831 Collagenous colitis: Secondary | ICD-10-CM

## 2021-07-22 DIAGNOSIS — N1831 Chronic kidney disease, stage 3a: Secondary | ICD-10-CM

## 2021-07-22 DIAGNOSIS — R635 Abnormal weight gain: Secondary | ICD-10-CM

## 2021-07-22 DIAGNOSIS — E538 Deficiency of other specified B group vitamins: Secondary | ICD-10-CM

## 2021-07-22 DIAGNOSIS — Z6837 Body mass index (BMI) 37.0-37.9, adult: Secondary | ICD-10-CM

## 2021-07-22 LAB — POCT URINALYSIS DIPSTICK
Bilirubin, UA: NEGATIVE
Blood, UA: NEGATIVE
Glucose, UA: NEGATIVE
Ketones, UA: NEGATIVE
Leukocytes, UA: NEGATIVE
Nitrite, UA: POSITIVE
Protein, UA: NEGATIVE
Spec Grav, UA: 1.02 (ref 1.010–1.025)
Urobilinogen, UA: 0.2 E.U./dL
pH, UA: 6 (ref 5.0–8.0)

## 2021-07-22 MED ORDER — ESTRADIOL 0.1 MG/GM VA CREA: TOPICAL_CREAM | VAGINAL | 2 refills | Status: AC

## 2021-07-22 MED ORDER — CIPROFLOXACIN HCL 500 MG PO TABS: 500.0000 mg | ORAL_TABLET | Freq: Two times a day (BID) | ORAL | 0 refills | Status: AC

## 2021-07-22 MED ORDER — PANTOPRAZOLE SODIUM 40 MG PO TBEC: 40.0000 mg | DELAYED_RELEASE_TABLET | Freq: Every day | ORAL | 3 refills | Status: DC

## 2021-07-22 NOTE — Progress Notes (Signed)
? ? ?[name], female    DOB: 10/07/1965, 56 y.o.   MRN: [id number] ? ?PCP: Delsa Grana, PA-C ? ?Chief Complaint  ?Patient presents with  ? Urinary Tract Infection  ?  X3 days, stomach/back pain, dark in color  ? ? ?Subjective:  ? ?Susan Snyder is a 56 y.o. female, presents to clinic with CC of the following: ? ?Concern for recurrent UTI, pt is s/p cystectomy, has had recurrent abd/plank pain x months - multiple visits here and hospitalization around christmas ?Weigh has increased, having difficulty with rash and her urostomy bags ? ?She is concerned for UTI again, had 2 in the past 6 months, last was March, severe pain started last week and into the weekend, causes her to be doubled over, started suddenly, everything except curling into fetal position made it worse.  ?Results for orders placed or performed in visit on 07/22/21  ?POCT Urinalysis Dipstick  ?Result Value Ref Range  ? Color, UA Yellow   ? Clarity, UA Clear   ? Glucose, UA Negative Negative  ? Bilirubin, UA Negative   ? Ketones, UA Negative   ? Spec Grav, UA 1.020 1.010 - 1.025  ? Blood, UA Negative   ? pH, UA 6.0 5.0 - 8.0  ? Protein, UA Negative Negative  ? Urobilinogen, UA 0.2 0.2 or 1.0 E.U./dL  ? Nitrite, UA Positive (large)   ? Leukocytes, UA Negative Negative  ? Appearance    ? Odor    ? ?Left upper abd pain and left flank pain coming and going, comes on severe, suddenly, lasts several days usually, worse with any activity or movement, only slightly alleviated with curling in fetal position  ?Sweats, chills, no fever ? ?Referral sent to Dr. Allen Norris for her recurrent LUQ abd pain - asking for possible endoscopy at the same time as colonoscopy -  ?Msg as follows in referral: ? ?She says she has had generalized abdominal pain since her surgery 02/20/2020, for bladder cancer.  She says the pain in her LUQ started this past February. She says she is not eating much and is bloating a lot. She gets really bad LUQ pain the pain will be sharp  and then will go away.  She says she is having other pains that feel like a urinary tract infection that she had around Christmas.  She says she has daily bowel movements, no constipation or diarrhea.  B[place] stool scale # 4.  Recently had a CT scan in December which showed Prior cystectomy with right abdomen ileal conduit. No stones or hydronephrosis ?  ?She is scheduled for a colonoscopy at the end of the month with Dr. Allen Norris, she was wondering if she could also get an endoscopy at the same time.   ?Best number [phone] ? ?She does endorse bloating, indigestion, reflux, states she is on acid medicine ?Labs and CT recently done were unremarkable ? ?Obesity, increased 30-50 lbs in the past 1-2 years ? ?Wt Readings from Last 10 Encounters:  ?07/22/21 249 lb (112.9 kg)  ?06/18/21 240 lb 4.8 oz (109 kg)  ?05/27/21 245 lb (111.1 kg)  ?03/20/21 241 lb 6.4 oz (109.5 kg)  ?03/14/21 200 lb (90.7 kg)  ?09/18/20 230 lb 12.8 oz (104.7 kg)  ?03/15/20 228 lb (103.4 kg)  ?02/19/20 228 lb 4.8 oz (103.6 kg)  ?12/06/19 226 lb 14.4 oz (102.9 kg)  ?11/23/19 220 lb (99.8 kg)  ? ?BMI Readings from Last 5 Encounters:  ?07/22/21 37.86 kg/m?  ?06/18/21  36.54 kg/m?  ?05/27/21 37.25 kg/m?  ?03/20/21 36.70 kg/m?  ?03/14/21 29.53 kg/m?  ? ?She is on meds for anxiety/depression -  ?Wellbutrin, but mood, energy, everything seems to be worse than in the past ? ?  07/22/2021  ?  1:27 PM 05/27/2021  ?  1:25 PM 03/20/2021  ?  2:43 PM  ?Depression screen PHQ 2/9  ?Decreased Interest 1 2 1   ?Down, Depressed, Hopeless 1 2 1   ?PHQ - 2 Score 2 4 2   ?Altered sleeping 3 2 0  ?Tired, decreased energy 3 2 0  ?Change in appetite 1 2 0  ?Feeling bad or failure about yourself  3 2 0  ?Trouble concentrating 0 1 0  ?Moving slowly or fidgety/restless 0 2 0  ?Suicidal thoughts 0 1 0  ?PHQ-9 Score 12 16 2   ?Difficult doing work/chores  Somewhat difficult Not difficult at all  ? ? ?  05/27/2021  ?  1:28 PM 06/26/2018  ? 10:01 AM  ?GAD 7 : Generalized Anxiety Score   ?Nervous, Anxious, on Edge 1 3  ?Control/stop worrying 1 3  ?Worry too much - different things 1 0  ?Trouble relaxing 1 0  ?Restless 1 3  ?Easily annoyed or irritable 2 0  ?Afraid - awful might happen 2 1  ?Total GAD 7 Score 9 10  ?Anxiety Difficulty Somewhat difficult Not difficult at all  ? ?Since cystectomy surgery she has been in constant pain, struggling to deal with all these changes to her body, health, she's lost a close friend to cancer, gained weight, feels tired all the time, having hot flashes and mood issues, so many complications after surgery, severe pain, infections.  Shes struggling to manage the urostomy bag at home on her own - was not given any help with discharged after surgery. ?She is coming up on her routine f/up for her cancer - and was told she cant see her doctor cause he  is out of network. ? ?She had both ovaries out with surgery - which I was not aware of - prior hysterectomy, no HRT done, she has not seen Gyn, she endorses hot flashes, sweats, mood issues, weight gain, recurrent vaginal sx. ? ? ?Patient Active Problem List  ? Diagnosis Date Noted  ? History of colonic polyps   ? Polyp of transverse colon   ? Bladder cancer ([place]) 02/20/2020  ? Mixed hyperlipidemia 12/20/2019  ? History of recurrent UTIs 08/01/2019  ? Major depressive disorder with current active episode 08/01/2019  ? Status post laparoscopic cholecystectomy 04/19/2019  ? Status post laparoscopic appendectomy 04/03/2019  ? Vitamin B12 deficiency 01/14/2015  ? Vitamin D deficiency 01/14/2015  ? Class 2 severe obesity with serious comorbidity and body mass index (BMI) of 37.0 to 37.9 in adult [facility]) 01/14/2015  ? Hx of iron deficiency anemia 01/14/2015  ? Hypertension   ? Collagenous colitis   ? H/O bilateral hip replacements 06/28/2014  ? Malabsorption due to intolerance, not elsewhere classified 01/02/2014  ? Lumbar disc herniation with radiculopathy 05/22/2013  ? Cervical spondylosis 02/06/2013  ? Intervertebral  cervical disc disorder with myelopathy, cervical region 02/06/2013  ? ? ? ? ?Current Outpatient Medications:  ?  aspirin EC 81 MG tablet, Take 1 tablet (81 mg total) by mouth daily., Disp: , Rfl:  ?  atorvastatin (LIPITOR) 10 MG tablet, Take 1 tablet (10 mg total) by mouth at bedtime., Disp: 90 tablet, Rfl: 3 ?  buPROPion (WELLBUTRIN XL) 150 MG 24 hr tablet, Take 1 tablet (150  mg total) by mouth in the morning and at bedtime. (Patient taking differently: Take 150 mg by mouth daily.), Disp: 180 tablet, Rfl: 3 ?  calcium carbonate (TUMS - DOSED IN MG ELEMENTAL CALCIUM) 500 MG chewable tablet, Chew 1 tablet by mouth daily as needed for indigestion or heartburn. , Disp: , Rfl:  ?  Cyanocobalamin (VITAMIN B-12 PO), Take by mouth., Disp: , Rfl:  ?  metoprolol succinate (TOPROL-XL) 25 MG 24 hr tablet, Take 1 tablet (25 mg total) by mouth at bedtime., Disp: 90 tablet, Rfl: 3 ?  VITAMIN D PO, Take by mouth., Disp: , Rfl:  ? ? ?Allergies  ?Allergen Reactions  ? Adhesive [Tape] Other (See Comments)  ?  Blisters  ? Gabapentin Nausea And Vomiting  ? Hydrocodone-Acetaminophen Nausea And Vomiting  ?  hallucinations  ? Keflex [Cephalexin] Shortness Of Breath and Palpitations  ? Sulfa Antibiotics Shortness Of Breath and Palpitations  ? Penicillins Rash  ?  Did it involve swelling of the face/tongue/throat, SOB, or low BP? No ?Did it involve sudden or severe rash/hives, skin peeling, or any reaction on the inside of your mouth or nose? No ?Did you need to seek medical attention at a hospital or doctor's office? No ?When did it last happen?      Childhood ?If all above answers are ?NO?, [place] proceed with cephalosporin use. ?  ? ? ? ?Social History  ? ?Tobacco Use  ? Smoking status: Former  ?  Packs/day: 1.00  ?  Years: 30.00  ?  Pack years: 30.00  ?  Types: Cigarettes  ?  Quit date: 09/22/2019  ?  Years since quitting: 1.8  ? Smokeless tobacco: Never  ?Vaping Use  ? Vaping Use: Former  ? Substances: Nicotine  ?Substance Use Topics  ?  Alcohol use: Yes  ?  Comment: rare  ? Drug use: No  ?  ? ? ?Chart Review Today: ?I personally reviewed active problem list, medication list, allergies, family history, social history, health maintenance, notes from las

## 2021-07-23 ENCOUNTER — Other Ambulatory Visit: Payer: Self-pay | Admitting: Family Medicine

## 2021-07-23 DIAGNOSIS — Z1231 Encounter for screening mammogram for malignant neoplasm of breast: Secondary | ICD-10-CM

## 2021-07-25 LAB — URINE CULTURE
MICRO NUMBER:: 13347581
SPECIMEN QUALITY:: ADEQUATE

## 2021-07-30 ENCOUNTER — Ambulatory Visit (INDEPENDENT_AMBULATORY_CARE_PROVIDER_SITE_OTHER): Payer: 59 | Admitting: Urology

## 2021-07-30 ENCOUNTER — Encounter: Payer: Self-pay | Admitting: Urology

## 2021-07-30 VITALS — BP 147/85 | HR 102 | Ht 68.0 in | Wt 251.0 lb

## 2021-07-30 DIAGNOSIS — Z8551 Personal history of malignant neoplasm of bladder: Secondary | ICD-10-CM | POA: Diagnosis not present

## 2021-07-30 DIAGNOSIS — R8271 Bacteriuria: Secondary | ICD-10-CM | POA: Diagnosis not present

## 2021-07-30 DIAGNOSIS — C679 Malignant neoplasm of bladder, unspecified: Secondary | ICD-10-CM

## 2021-07-30 DIAGNOSIS — R350 Frequency of micturition: Secondary | ICD-10-CM | POA: Diagnosis not present

## 2021-07-30 DIAGNOSIS — D414 Neoplasm of uncertain behavior of bladder: Secondary | ICD-10-CM

## 2021-07-30 LAB — MICROSCOPIC EXAMINATION

## 2021-07-30 LAB — URINALYSIS, COMPLETE
Bilirubin, UA: NEGATIVE
Glucose, UA: NEGATIVE
Ketones, UA: NEGATIVE
Nitrite, UA: NEGATIVE
Protein,UA: NEGATIVE
Specific Gravity, UA: 1.025 (ref 1.005–1.030)
Urobilinogen, Ur: 0.2 mg/dL (ref 0.2–1.0)
pH, UA: 6 (ref 5.0–7.5)

## 2021-07-30 NOTE — Progress Notes (Signed)
? ?07/30/21 ?2:56 PM  ? ?Susan Snyder ?[birth year] ?[id number] ? ?Referring provider:  ?Delsa Grana, PA-C ?[address] ?[address] ?[place],  [place] [address] ?Chief Complaint  ?Patient presents with  ? Follow-up  ? ? ? ?HPI: ?Susan Snyder is a 56 y.o.female with a personal history of bladder cancer, who presents today for follow-up.  She is seeking to reestablish here as Dr. Zettie Pho practice no longer takes her insurance. ? ?She has a personal history of bladder cancer.  She is s/p TURBT and intravesical chemotherapy on 11/19/2019.  ?TURBT from 10/12/2019 noted a posterior wall tumor, a cluster of dome to anterior tumors. Initial surgical pathology revealed high-grade urothelial carcinoma with variant features, 50% giant cell features invasive into the lamina propria at minimum.  There was concomitant CIS.  Despite surgical resection being relatively deep into the fat, no muscularis propria is identified.  She did undergo a cystogram which was negative and catheter was subsequently removed. ? ?She underwent another TURBT and intravesical chemotherapy on 11/19/2019. Papillary tumor on the posterior bladder wall, approximately 1 cm area of obvious residual tumor with surrounding erythema.  Extensive necrosis at previous resection bed at dome, anterior bladder wall.  Total of at least 3 cm x 3 cm area reresected extending from dome, posterior bladder wall to the right lateral bladder wall. ?  ?Pathology on 11/19/2019 noted high-grade urothelial carcinoma with invasion of lamina propria. Muscularis propria not identified. There was focal residual high-grade urothelial carcinoma with invasion of lamina propria. Urothelial carcinoma in situ. Muscularis propria was present and no invasion was identified; interpretation is limited due to extensive necrosis.  ? ?She underwent a robotic assisted complete cystectomy ileal conduit and bilateral oophorectomy with Dr Tresa Moore on 02/20/2020. Terminal stellate ischemia which was managed  conservatively. She had giant cell differentiation.  ? ?Her postoperative course has been complicated by distal stomal ischemia.  She is gained significant weight in the interim as well and had retraction of her stoma.  Given adequate drainage, no further intervention was recommended. ? ?She continues on surveillance imaging. CT abdomen and pelvis on 03/14/2021 was unremarkable.  ? ?She has been treated multiple times for UTIs by her PCP.  She was hospitalized for this in December when her symptoms were specific for urinary tract infection.  Since then, she been having multiple follow-up urinalysis with nonspecific symptoms, including left upper quadrant pain radiating from underneath her rib cage to her back of unclear etiology.  She is been diagnosed and treated with multiple "UTIs "since then.  He is on Cipro now.  She has not had any systemic signs of infection.  ? ? She had a CMP in March.  ? ?She just started hormones for menopausal symptoms. She reports that she has gained 51 lbs she has been actively trying to lose weight. ? ?PMH: ?Past Medical History:  ?Diagnosis Date  ? Abnormal finding on MRI of brain 12/28/2014  ? Sept 28, 2016 [facility]  ? Anemia   ? Cancer [facility])   ? bladder  ? CCC (chronic calculous cholecystitis) 04/03/2019  ? Chronic diarrhea   ? Chronic gastritis 11/2013  ? on EGD   ? Collagenous colitis   ? colonoscopy biopsies 11/01/13  ? Complication of anesthesia   ? DDD (degenerative disc disease), cervical   ? Frequent headaches 12/28/2014  ? GERD (gastroesophageal reflux disease)   ? 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 History of kidney stones   ? Hyperplastic polyps of stomach   ? Aug  2015  ? Hypertension   ? Neoplasm of uncertain behavior of skin 05/20/2016  ? Hx of skin cancer, BCC  ? Neoplasm of uncertain behavior of skin 05/20/2016  ? Hx of skin cancer, BCC  ? PONV (postoperative nausea and vomiting)   ? only with her hysterectomy  ? Presence of urostomy [facility])   ? Primary localized osteoarthrosis,  pelvic region and thigh 10/25/2013  ? Renal cyst   ? Tobacco abuse 12/23/2014  ? ? ?Surgical History: ?Past Surgical History:  ?Procedure Laterality Date  ? ABDOMINAL HYSTERECTOMY  2005  ? partial, still has ovaries, due to fibroids.  ? APPENDECTOMY    ? [place]  ? x 4  ? CHOLECYSTECTOMY N/A 04/11/2019  ? Procedure: LAPAROSCOPIC CHOLECYSTECTOMY;  Surgeon: Ronny Bacon, MD;  Location: [facility];  Service: General;  Laterality: N/A;  ? COLONOSCOPY W/ BIOPSIES  11/01/13  ? collagenous colitis, sigmoid polypectomy x 2; hyperplastic polyps  ? COLONOSCOPY WITH PROPOFOL N/A 06/18/2021  ? Procedure: COLONOSCOPY WITH PROPOFOL;  Surgeon: Lucilla Lame, MD;  Location: [facility];  Service: Endoscopy;  Laterality: N/A;  ? CYSTECTOMY N/A 02/22/2020  ? Procedure: ENDOSCOPY OF ILEAL CONDUIT;  Surgeon: Alexis Frock, MD;  Location: [facility];  Service: Urology;  Laterality: N/A;  ? CYSTOSCOPY WITH BIOPSY N/A 11/19/2019  ? Procedure: CYSTOSCOPY WITH BIOPSY;  Surgeon: [name], MD;  Location: [facility];  Service: Urology;  Laterality: N/A;  ? CYSTOSCOPY WITH INJECTION N/A 02/20/2020  ? Procedure: CYSTOSCOPY WITH INJECTION OF INDOCYANINE GREEN DYE;  Surgeon: Alexis Frock, MD;  Location: [facility];  Service: Urology;  Laterality: N/A;  ? ESOPHAGOGASTRODUODENOSCOPY  11/22/13  ? chronic gastritis  ? HIP ARTHROPLASTY Bilateral 01/15/14,03/05/14  ? total hip, Dr. Nilsa Nutting  ? LAPAROSCOPIC APPENDECTOMY N/A 08/20/2018  ? Procedure: APPENDECTOMY LAPAROSCOPIC;  Surgeon: Fredirick Maudlin, MD;  Location: [facility];  Service: General;  Laterality: N/A;  ? LYMPHADENECTOMY Bilateral 02/20/2020  ? Procedure: LYMPHADENECTOMY;  Surgeon: Alexis Frock, MD;  [facility];  Service: Urology;  Laterality: Bilateral;  ? NECK SURGERY  2015  ? POLYPECTOMY  06/18/2021  ? Procedure: POLYPECTOMY;  Surgeon: Lucilla Lame, MD;  [facility];  Service: Endoscopy;;  ? ROBOT ASSISTED LAPAROSCOPIC COMPLETE CYSTECT ILEAL  CONDUIT N/A 02/20/2020  ? Procedure: XI ROBOTIC ASSISTED LAPAROSCOPIC COMPLETE CYSTECT ILEAL CONDUIT AND BILATERAL OOPHORECTOMY;  Surgeon: Alexis Frock, MD;  Location: [facility];  Service: Urology;  Laterality: N/A;  6 HRS  ? TRANSURETHRAL RESECTION OF BLADDER TUMOR WITH MITOMYCIN-C N/A 10/29/2019  ? Procedure: TRANSURETHRAL RESECTION OF BLADDER TUMOR;  Surgeon: [name], MD;  Location: [facility];  Service: Urology;  Laterality: N/A;  ? TRANSURETHRAL RESECTION OF BLADDER TUMOR WITH MITOMYCIN-C N/A 11/19/2019  ? Procedure: TRANSURETHRAL RESECTION OF BLADDER TUMOR WITH Gemcitabine;  Surgeon: [name], MD;  Location: [facility];  Service: Urology;  Laterality: N/A;  ? ? ?Home Medications:  ?Allergies as of 07/30/2021   ? ?   Reactions  ? Adhesive [tape] Other (See Comments)  ? Blisters  ? Gabapentin Nausea And Vomiting  ? Hydrocodone-acetaminophen Nausea And Vomiting  ? hallucinations  ? Keflex [cephalexin] Shortness Of Breath, Palpitations  ? Sulfa Antibiotics Shortness Of Breath, Palpitations  ? Penicillins Rash  ? Did it involve swelling of the face/tongue/throat, SOB, or low BP? No ?Did it involve sudden or severe rash/hives, skin peeling, or any reaction on the inside of your mouth or nose? No ?Did you need to seek medical attention at  a hospital or doctor's office? No ?When did it last happen?      Childhood ?If all above answers are ?NO?, [place] proceed with cephalosporin use.  ? ?  ? ?  ?Medication List  ?  ? ?  ? Accurate as of [place] 11, 2023  2:56 PM. If you have any questions, ask your nurse or doctor.  ?  ?  ? ?  ? ?aspirin EC 81 MG tablet ?Take 1 tablet (81 mg total) by mouth daily. ?  ?atorvastatin 10 MG tablet ?Commonly known as: LIPITOR ?Take 1 tablet (10 mg total) by mouth at bedtime. ?  ?buPROPion 150 MG 24 hr tablet ?Commonly known as: WELLBUTRIN XL ?Take 1 tablet (150 mg total) by mouth in the morning and at bedtime. ?What changed: when to take this ?  ?calcium carbonate 500 MG chewable  tablet ?Commonly known as: TUMS - dosed in mg elemental calcium ?Chew 1 tablet by mouth daily as needed for indigestion or heartburn. ?  ?ciprofloxacin 500 MG tablet ?Commonly known as: CIPRO ?Take 500 mg by mout

## 2021-08-26 ENCOUNTER — Ambulatory Visit
Admission: RE | Admit: 2021-08-26 | Discharge: 2021-08-26 | Disposition: A | Discharge status: Home / Self Care | Payer: 59 | Source: Ambulatory Visit | Attending: Family Medicine | Admitting: Family Medicine

## 2021-08-26 DIAGNOSIS — Z1231 Encounter for screening mammogram for malignant neoplasm of breast: Secondary | ICD-10-CM | POA: Insufficient documentation

## 2021-09-30 ENCOUNTER — Encounter: Payer: Self-pay | Admitting: Urology

## 2021-10-02 NOTE — Telephone Encounter (Signed)
Scans have been approved and are ready to schedule.   Sharyn Lull [phone]

## 2021-10-02 NOTE — Telephone Encounter (Signed)
Left Vm to return call

## 2021-10-18 ENCOUNTER — Other Ambulatory Visit: Payer: Self-pay | Admitting: Family Medicine

## 2021-10-18 DIAGNOSIS — R109 Unspecified abdominal pain: Secondary | ICD-10-CM

## 2021-10-29 ENCOUNTER — Other Ambulatory Visit: Payer: Self-pay | Admitting: Family Medicine

## 2021-10-29 DIAGNOSIS — I10 Essential (primary) hypertension: Secondary | ICD-10-CM

## 2021-10-29 NOTE — Telephone Encounter (Signed)
Medication Refill - Medication: buPROPion (WELLBUTRIN XL) 150 MG 24 hr tablet, atorvastatin (LIPITOR) 10 MG tablet, metoprolol succinate (TOPROL-XL) 25 MG 24 hr tablet  Has the patient contacted their pharmacy? Yes.     Preferred Pharmacy (with phone number or street name):  [id number] [place]:  [phone] Fax:  [phone]    Has the patient been seen for an appointment in the last year OR does the patient have an upcoming appointment? Yes.

## 2021-10-30 MED ORDER — METOPROLOL SUCCINATE ER 25 MG PO TB24: 25.0000 mg | ORAL_TABLET | Freq: Every day | ORAL | 0 refills | Status: DC

## 2021-10-30 MED ORDER — BUPROPION HCL ER (XL) 150 MG PO TB24: 150.0000 mg | ORAL_TABLET | Freq: Two times a day (BID) | ORAL | 0 refills | Status: DC

## 2021-10-30 MED ORDER — ATORVASTATIN CALCIUM 10 MG PO TABS: 10.0000 mg | ORAL_TABLET | Freq: Every day | ORAL | 2 refills | Status: DC

## 2021-10-30 NOTE — Telephone Encounter (Signed)
Requested Prescriptions  Pending Prescriptions Disp Refills  . buPROPion (WELLBUTRIN XL) 150 MG 24 hr tablet 180 tablet 0    Sig: Take 1 tablet (150 mg total) by mouth in the morning and at bedtime.     Psychiatry: Antidepressants - bupropion Failed - 10/29/2021  4:02 PM      Failed - Cr in normal range and within 360 days    Creat  Date Value Ref Range Status  05/27/2021 1.32 (H) 0.50 - 1.03 mg/dL Final         Failed - Last BP in normal range    BP Readings from Last 1 Encounters:  07/30/21 (!) 147/85         Passed - AST in normal range and within 360 days    AST  Date Value Ref Range Status  05/27/2021 13 10 - 35 U/L Final         Passed - ALT in normal range and within 360 days    ALT  Date Value Ref Range Status  05/27/2021 11 6 - 29 U/L Final         Passed - Completed PHQ-2 or PHQ-9 in the last 360 days      Passed - Valid encounter within last 6 months    Recent Outpatient Visits          3 months ago Flank pain   [facility], Kristeen Miss, PA-C   5 months ago Left upper quadrant abdominal pain   [facility] Bo Merino, FNP   7 months ago Annual physical exam   [facility] Bo Merino, FNP   1 year ago Fatigue, unspecified type   [facility] Kathrine Haddock, NP   1 year ago Essential hypertension   [facility] Delsa Grana, PA-C      Future Appointments            In 3 weeks Ralene Bathe, MD [place]   In 4 months [name], MD [place]   In 4 months Reece Packer, Myna Hidalgo, [facility], [place]           . atorvastatin (LIPITOR) 10 MG tablet 90 tablet 2    Sig: Take 1 tablet (10 mg total) by mouth at bedtime.     Cardiovascular:  Antilipid - Statins Failed - 10/29/2021  4:02 PM      Failed - Lipid Panel in normal range within the last 12 months    Cholesterol, Total  Date Value Ref  Range Status  01/14/2015 179 100 - 199 mg/dL Final   Cholesterol  Date Value Ref Range Status  05/27/2021 127 <200 mg/dL Final   LDL Cholesterol (Calc)  Date Value Ref Range Status  05/27/2021 69 mg/dL (calc) Final    Comment:    Reference range: <100 . Desirable range <100 mg/dL for primary prevention;   <70 mg/dL for patients with CHD or diabetic patients  with > or = 2 CHD risk factors. Marland Kitchen LDL-C is now calculated using the Martin-Hopkins  calculation, which is a validated novel method providing  better accuracy than the Friedewald equation in the  estimation of LDL-C.  Cresenciano Genre et al. Annamaria Helling. 0160;109(32): 2061-2068  (http://education.[employer].com/faq/FAQ164)    HDL  Date Value Ref Range Status  05/27/2021 35 (L) > OR = 50 mg/dL Final  01/14/2015 37 (L) >39 mg/dL Final    Comment:  According to ATP-III Guidelines, HDL-C >59 mg/dL is considered a negative risk factor for CHD.    Triglycerides  Date Value Ref Range Status  05/27/2021 152 (H) <150 mg/dL Final         Passed - Patient is not pregnant      Passed - Valid encounter within last 12 months    Recent Outpatient Visits          3 months ago Flank pain   [facility], Kristeen Miss, PA-C   5 months ago Left upper quadrant abdominal pain   [facility] Bo Merino, FNP   7 months ago Annual physical exam   [facility] Bo Merino, FNP   1 year ago Fatigue, unspecified type   [facility] Kathrine Haddock, NP   1 year ago Essential hypertension   [facility] Delsa Grana, PA-C      Future Appointments            In 3 weeks Ralene Bathe, MD [place]   In 4 months [name], MD [place]   In 4 months Reece Packer, Myna Hidalgo, [facility], [facility]           . metoprolol succinate (TOPROL-XL) 25 MG 24 hr tablet 90 tablet 0    Sig:  Take 1 tablet (25 mg total) by mouth at bedtime.     Cardiovascular:  Beta Blockers Failed - 10/29/2021  4:02 PM      Failed - Last BP in normal range    BP Readings from Last 1 Encounters:  07/30/21 (!) 147/85         Passed - Last Heart Rate in normal range    Pulse Readings from Last 1 Encounters:  07/30/21 (!) 102         Passed - Valid encounter within last 6 months    Recent Outpatient Visits          3 months ago Flank pain   [facility] [place], [place], PA-C   5 months ago Left upper quadrant abdominal pain   [facility] Bo Merino, FNP   7 months ago Annual physical exam   [facility] Bo Merino, FNP   1 year ago Fatigue, unspecified type   [facility] Kathrine Haddock, NP   1 year ago Essential hypertension   [facility] Delsa Grana, PA-C      Future Appointments            In 3 weeks Ralene Bathe, MD [place]   In 4 months [name], MD [place]   In 4 months Reece Packer, Myna Hidalgo, [facility], [facility]

## 2021-11-26 ENCOUNTER — Encounter: Payer: Self-pay | Admitting: Dermatology

## 2021-11-26 ENCOUNTER — Ambulatory Visit (INDEPENDENT_AMBULATORY_CARE_PROVIDER_SITE_OTHER): Payer: 59 | Admitting: Dermatology

## 2021-11-26 DIAGNOSIS — C44612 Basal cell carcinoma of skin of right upper limb, including shoulder: Secondary | ICD-10-CM | POA: Diagnosis not present

## 2021-11-26 DIAGNOSIS — B351 Tinea unguium: Secondary | ICD-10-CM

## 2021-11-26 DIAGNOSIS — D1801 Hemangioma of skin and subcutaneous tissue: Secondary | ICD-10-CM | POA: Diagnosis not present

## 2021-11-26 DIAGNOSIS — Z1283 Encounter for screening for malignant neoplasm of skin: Secondary | ICD-10-CM

## 2021-11-26 DIAGNOSIS — L820 Inflamed seborrheic keratosis: Secondary | ICD-10-CM

## 2021-11-26 DIAGNOSIS — Z85828 Personal history of other malignant neoplasm of skin: Secondary | ICD-10-CM

## 2021-11-26 DIAGNOSIS — L821 Other seborrheic keratosis: Secondary | ICD-10-CM

## 2021-11-26 DIAGNOSIS — D229 Melanocytic nevi, unspecified: Secondary | ICD-10-CM

## 2021-11-26 DIAGNOSIS — L578 Other skin changes due to chronic exposure to nonionizing radiation: Secondary | ICD-10-CM

## 2021-11-26 DIAGNOSIS — B353 Tinea pedis: Secondary | ICD-10-CM | POA: Diagnosis not present

## 2021-11-26 DIAGNOSIS — Z7189 Other specified counseling: Secondary | ICD-10-CM

## 2021-11-26 DIAGNOSIS — L814 Other melanin hyperpigmentation: Secondary | ICD-10-CM | POA: Diagnosis not present

## 2021-11-26 DIAGNOSIS — D485 Neoplasm of uncertain behavior of skin: Secondary | ICD-10-CM | POA: Diagnosis not present

## 2021-11-26 MED ORDER — TERBINAFINE HCL 250 MG PO TABS: ORAL_TABLET | ORAL | 0 refills | Status: DC

## 2021-11-26 NOTE — Progress Notes (Unsigned)
New Patient Visit  Subjective  Susan Snyder is a 56 y.o. female who presents for the following: Irregular skin lesions (Patient's PCP is concerned she Everson have skin cancers and patient would like lesions checked today - R hand, back x 2, B/L shoulder, L neck. ). The patient presents for Total-Body Skin Exam (TBSE) for skin cancer screening and mole check.  The patient has spots, moles and lesions to be evaluated, some Bertelson be new or changing and the patient has concerns that these could be cancer.  The following portions of the chart were reviewed this encounter and updated as appropriate:   Tobacco  Allergies  Meds  Problems  Med Hx  Surg Hx  Fam Hx     Review of Systems:  No other skin or systemic complaints except as noted in HPI or Assessment and Plan.  Objective  Well appearing patient in no apparent distress; mood and affect are within normal limits.  A full examination was performed including scalp, head, eyes, ears, nose, lips, neck, chest, axillae, abdomen, back, buttocks, bilateral upper extremities, bilateral lower extremities, hands, feet, fingers, toes, fingernails, and toenails. All findings within normal limits unless otherwise noted below.  R hand dorsum 0.8 cm pink papule.   L ant neck 1.2 cm flesh colored papule.  B/L feet and toes Toenail dystrophy and scale.  R temple x 1, trunk x 9 (10) Erythematous stuck-on, waxy papule or plaque   Assessment & Plan  Neoplasm of uncertain behavior of skin (2) R hand dorsum Epidermal / dermal shaving  Lesion diameter (cm):  0.8 Informed consent: discussed and consent obtained   Timeout: patient name, date of birth, surgical site, and procedure verified   Procedure prep:  Patient was prepped and draped in usual sterile fashion Prep type:  Isopropyl alcohol Anesthesia: the lesion was anesthetized in a standard fashion   Anesthetic:  1% lidocaine w/ epinephrine 1-100,000 buffered w/ 8.4% NaHCO3 Instrument used:  flexible razor blade   Hemostasis achieved with: pressure, aluminum chloride and electrodesiccation   Outcome: patient tolerated procedure well   Post-procedure details: sterile dressing applied and wound care instructions given   Dressing type: bandage and petrolatum    Destruction of lesion Complexity: extensive   Destruction method: electrodesiccation and curettage   Informed consent: discussed and consent obtained   Timeout:  patient name, date of birth, surgical site, and procedure verified Procedure prep:  Patient was prepped and draped in usual sterile fashion Prep type:  Isopropyl alcohol Anesthesia: the lesion was anesthetized in a standard fashion   Anesthetic:  1% lidocaine w/ epinephrine 1-100,000 buffered w/ 8.4% NaHCO3 Curettage performed in three different directions: Yes   Electrodesiccation performed over the curetted area: Yes   Lesion length (cm):  0.8 Lesion width (cm):  0.8 Margin per side (cm):  0.2 Final wound size (cm):  1.2 Hemostasis achieved with:  pressure, aluminum chloride and electrodesiccation Outcome: patient tolerated procedure well with no complications   Post-procedure details: sterile dressing applied and wound care instructions given   Dressing type: bandage and petrolatum    Specimen 1 - Surgical pathology Differential Diagnosis: D48.5 r/o SCC  ED&C today Check Margins: No  L ant neck Epidermal / dermal shaving  Lesion diameter (cm):  1.2 Informed consent: discussed and consent obtained   Timeout: patient name, date of birth, surgical site, and procedure verified   Procedure prep:  Patient was prepped and draped in usual sterile fashion Prep type:  Isopropyl alcohol Anesthesia:  the lesion was anesthetized in a standard fashion   Anesthetic:  1% lidocaine w/ epinephrine 1-100,000 buffered w/ 8.4% NaHCO3 Instrument used: flexible razor blade   Hemostasis achieved with: pressure, aluminum chloride and electrodesiccation   Outcome: patient  tolerated procedure well   Post-procedure details: sterile dressing applied and wound care instructions given   Dressing type: bandage and petrolatum    Specimen 2 - Surgical pathology Differential Diagnosis: Irritated skin tag vs irritated nevus r/o dysplasia Check Margins: No  Tinea unguium B/L feet and toes With tinea pedis - reviewed labs from 06/08/21 WNL Chronic and persistent condition with duration or expected duration over one year. Condition is symptomatic / bothersome to patient. Not to goal.  Start Terbinafine 290m po QD. #30 2RF. Terbinafine Counseling  Terbinafine is an anti-fungal medicine that can be applied to the skin (over the counter) or taken by mouth (prescription) to treat fungal infections. The pill version is often used to treat fungal infections of the nails or scalp. While most people do not have any side effects from taking terbinafine pills, some possible side effects of the medicine can include taste changes, headache, loss of smell, vision changes, nausea, vomiting, or diarrhea.   Rare side effects can include irritation of the liver, allergic reaction, or decrease in blood counts (which [place] show up as not feeling well or developing an infection). If you are concerned about any of these side effects, please stop the medicine and call your doctor, or in the case of an emergency such as feeling very unwell, seek immediate medical care.   terbinafine (LAMISIL) 250 MG tablet - B/L feet and toes Take one tab po QD  Inflamed seborrheic keratosis (10) R temple x 1, trunk x 9 Symptomatic, irritating, patient would like treated. Destruction of lesion - R temple x 1, trunk x 9 Complexity: simple   Destruction method: cryotherapy   Informed consent: discussed and consent obtained   Timeout:  patient name, date of birth, surgical site, and procedure verified Lesion destroyed using liquid nitrogen: Yes   Region frozen until ice ball extended beyond lesion: Yes    Outcome: patient tolerated procedure well with no complications   Post-procedure details: wound care instructions given    Lentigines - Scattered tan macules - Due to sun exposure - Benign-appearing, observe - Recommend daily broad spectrum sunscreen SPF 30+ to sun-exposed areas, reapply every 2 hours as needed. - Call for any changes  Seborrheic Keratoses - Stuck-on, waxy, tan-brown papules and/or plaques  - Benign-appearing - Discussed benign etiology and prognosis. - Observe - Call for any changes  Melanocytic Nevi - Tan-brown and/or pink-flesh-colored symmetric macules and papules - Benign appearing on exam today - Observation - Call clinic for new or changing moles - Recommend daily use of broad spectrum spf 30+ sunscreen to sun-exposed areas.   Hemangiomas - Red papules - Discussed benign nature - Observe - Call for any changes  Actinic Damage - Chronic condition, secondary to cumulative UV/sun exposure - diffuse scaly erythematous macules with underlying dyspigmentation - Recommend daily broad spectrum sunscreen SPF 30+ to sun-exposed areas, reapply every 2 hours as needed.  - Staying in the shade or wearing long sleeves, sun glasses (UVA+UVB protection) and wide brim hats (4-inch brim around the entire circumference of the hat) are also recommended for sun protection.  - Call for new or changing lesions.  History of Basal Cell Carcinoma of the Skin - No evidence of recurrence today - Recommend regular full body skin  exams - Recommend daily broad spectrum sunscreen SPF 30+ to sun-exposed areas, reapply every 2 hours as needed.  - Call if any new or changing lesions are noted between office visits  Skin cancer screening performed today.  Return in about 2 months (around 01/26/2022) for tinea recheck and ISK follow up .  Luther Redo, CMA, am acting as scribe for Sarina Ser, MD . Documentation: I have reviewed the above documentation for accuracy and  completeness, and I agree with the above.  Sarina Ser, MD

## 2021-11-26 NOTE — Patient Instructions (Addendum)
Electrodesiccation and Curettage ("Scrape and Burn") Wound Care Instructions  Leave the original bandage on for 24 hours if possible.  If the bandage becomes soaked or soiled before that time, it is OK to remove it and examine the wound.  A small amount of post-operative bleeding is normal.  If excessive bleeding occurs, remove the bandage, place gauze over the site and apply continuous pressure (no peeking) over the area for 30 minutes. If this does not work, please call our clinic as soon as possible or page your doctor if it is after hours.   Once a day, cleanse the wound with soap and water. It is fine to shower. If a thick crust develops you B[place] use a Q-tip dipped into dilute hydrogen peroxide (mix 1:1 with water) to dissolve it.  Hydrogen peroxide can slow the healing process, so use it only as needed.    After washing, apply petroleum jelly (Vaseline) or an antibiotic ointment if your doctor prescribed one for you, followed by a bandage.    For best healing, the wound should be covered with a layer of ointment at all times. If you are not able to keep the area covered with a bandage to hold the ointment in place, this Anand mean re-applying the ointment several times a day.  Continue this wound care until the wound has healed and is no longer open. It Tangeman take several weeks for the wound to heal and close.  Itching and mild discomfort is normal during the healing process.  If you have any discomfort, you can take Tylenol (acetaminophen) or ibuprofen as directed on the bottle. (Please do not take these if you have an allergy to them or cannot take them for another reason).  Some redness, tenderness and white or yellow material in the wound is normal healing.  If the area becomes very sore and red, or develops a thick yellow-green material (pus), it Leitz be infected; please notify us.    Wound healing continues for up to one year following surgery. It is not unusual to experience pain in the scar  from time to time during the interval.  If the pain becomes severe or the scar thickens, you should notify the office.    A slight amount of redness in a scar is expected for the first six months.  After six months, the redness will fade and the scar will soften and fade.  The color difference becomes less noticeable with time.  If there are any problems, return for a post-op surgery check at your earliest convenience.  To improve the appearance of the scar, you can use silicone scar gel, cream, or sheets (such as Mederma or Serica) every night for up to one year. These are available over the counter (without a prescription).  Please call our office at [phone] for any questions or concerns.     Due to recent changes in healthcare laws, you Felkins see results of your pathology and/or laboratory studies on MyChart before the doctors have had a chance to review them. We understand that in some cases there [place] be results that are confusing or concerning to you. Please understand that not all results are received at the same time and often the doctors Kul[place] need to interpret multiple results in order to provide you with the best plan of care or course of treatment. Therefore, we ask that you please give [place] 2 business days to thoroughly review all your results before contacting the office for clarification. Should  we see a critical lab result, you will be contacted sooner.   If You Need Anything After Your Visit  If you have any questions or concerns for your doctor, please call our main line at [phone] and press option 4 to reach your doctor's medical assistant. If no one answers, please leave a voicemail as directed and we will return your call as soon as possible. Messages left after 4 pm will be answered the following business day.   You [place] also send [place] a message via [place]. We typically respond to MyChart messages within 1-2 business days.  For prescription refills, please ask your  pharmacy to contact our office. Our fax number is [phone].  If you have an urgent issue when the clinic is closed that cannot wait until the next business day, you can page your doctor at the number below.    Please note that while we do our best to be available for urgent issues outside of office hours, we are not available 24/7.   If you have an urgent issue and are unable to reach [place], you [place] choose to seek medical care at your doctor's office, retail clinic, urgent care center, or emergency room.  If you have a medical emergency, please immediately call 911 or go to the emergency department.  Pager Numbers  - Dr. Nehemiah Massed: [phone]  - Dr. Laurence Ferrari: [phone]  - Dr. Nicole Kindred: [phone]  In the event of inclement weather, please call our main line at [phone] for an update on the status of any delays or closures.  Dermatology Medication Tips: Please keep the boxes that topical medications come in in order to help keep track of the instructions about where and how to use these. Pharmacies typically print the medication instructions only on the boxes and not directly on the medication tubes.   If your medication is too expensive, please contact our office at [phone] option 4 or send [place] a message through [place].   We are unable to tell what your co-pay for medications will be in advance as this is different depending on your insurance coverage. However, we Wike be able to find a substitute medication at lower cost or fill out paperwork to get insurance to cover a needed medication.   If a prior authorization is required to get your medication covered by your insurance company, please allow [place] 1-2 business days to complete this process.  Drug prices often vary depending on where the prescription is filled and some pharmacies [place] offer cheaper prices.  The website www.goodrx.com contains coupons for medications through different pharmacies. The prices here do not  account for what the cost Hinger be with help from insurance (it To[place] be cheaper with your insurance), but the website can give you the price if you did not use any insurance.  - You can print the associated coupon and take it with your prescription to the pharmacy.  - You Hersh also stop by our office during regular business hours and pick up a GoodRx coupon card.  - If you need your prescription sent electronically to a different pharmacy, notify our office through [facility] or by phone at [phone] option 4.     Si Usted Necesita Algo Despus de Su Visita  Tambin puede enviarnos un mensaje a travs de Pharmacist, community. Por lo general respondemos a los mensajes de MyChart en el transcurso de 1 a 2 das hbiles.  Para renovar recetas, por favor pida a su farmacia que se ponga en  contacto con nuestra oficina. Harland Dingwall de fax es [place] [phone].  Si tiene un asunto urgente cuando la clnica est cerrada y que no puede esperar hasta el siguiente da hbil, puede llamar/localizar a su doctor(a) al nmero que aparece a continuacin.   Por favor, tenga en cuenta que aunque hacemos todo lo posible para estar disponibles para asuntos urgentes fuera del horario de [place], no estamos disponibles las 24 horas del da, los 7 das de la [place].   Si tiene un problema urgente y no puede comunicarse con nosotros, puede optar por buscar atencin mdica  en el consultorio de su doctor(a), en una clnica privada, en un centro de atencin urgente o en una sala de emergencias.  Si tiene Engineering geologist, por favor llame inmediatamente al 911 o vaya a la sala de emergencias.  Nmeros de bper  - Dr. Nehemiah Massed: [phone]  - Dra. Moye: [phone]  - Dra. Nicole Kindred: [phone]  En caso de inclemencias del [place], por favor llame a Johnsie Kindred principal al [phone] para una actualizacin sobre el [place] de cualquier retraso o cierre.  Consejos para la medicacin en dermatologa: Por  favor, guarde las cajas en las que vienen los medicamentos de uso tpico para ayudarle a seguir las instrucciones sobre dnde y cmo usarlos. Las farmacias generalmente imprimen las instrucciones del medicamento slo en las cajas y no directamente en los tubos del [place].   Si su medicamento es muy caro, por favor, pngase en contacto con Zigmund Daniel llamando al [phone] y presione la opcin 4 o envenos un mensaje a travs de Pharmacist, community.   No podemos decirle cul ser su copago por los medicamentos por adelantado ya que esto es diferente dependiendo de la cobertura de su seguro. Sin embargo, es posible que podamos encontrar un medicamento sustituto a Electrical engineer un formulario para que el seguro cubra el medicamento que se considera necesario.   Si se requiere una autorizacin previa para que su compaa de seguros [place] su medicamento, por favor permtanos de 1 a 2 das hbiles para completar este proceso.  Los precios de los medicamentos varan con frecuencia dependiendo del Environmental consultant de dnde se surte la receta y alguna farmacias pueden ofrecer precios ms baratos.  El sitio web www.goodrx.com tiene cupones para medicamentos de Airline pilot. Los precios aqu no tienen en cuenta lo que podra costar con la ayuda del seguro (puede ser ms barato con su seguro), pero el sitio web puede darle el precio si no utiliz Research scientist (physical sciences).  - Puede imprimir el cupn correspondiente y llevarlo con su receta a la farmacia.  - Tambin puede pasar por nuestra oficina durante el horario de atencin regular y Charity fundraiser una tarjeta de cupones de GoodRx.  - Si necesita que su receta se enve electrnicamente a una farmacia diferente, informe a nuestra oficina a travs de MyChart de [place] o por telfono llamando al [phone] y presione la opcin 4.

## 2021-12-01 ENCOUNTER — Encounter: Payer: Self-pay | Admitting: Dermatology

## 2021-12-02 ENCOUNTER — Telehealth: Payer: Self-pay

## 2021-12-02 NOTE — Telephone Encounter (Signed)
Left voicemail to return my call

## 2021-12-02 NOTE — Telephone Encounter (Signed)
-----   Message from Ralene Bathe, MD sent at 12/01/2021  7:08 PM EDT ----- Diagnosis 1. Skin , right hand dorsum BASAL CELL CARCINOMA, NODULAR PATTERN 2. Skin , left ant neck SEBORRHEIC KERATOSIS, IRRITATED  1- Cancer - BCC Already treated Recheck next visit 2- benign irritated keratosis No further treatment needed

## 2021-12-08 IMAGING — US US ABDOMEN LIMITED
1 series · 14 of 25 positions shown · non-contrast
Comparison: 08/20/2018 CT abdomen/pelvis.

CLINICAL DATA: Right upper quadrant abdominal pain for 2 days.

EXAM:
ULTRASOUND ABDOMEN LIMITED RIGHT UPPER QUADRANT

[Series 1: us abdomen limited · 0.28mm/px · 14 of 49 slices shown]
[im 1/49]
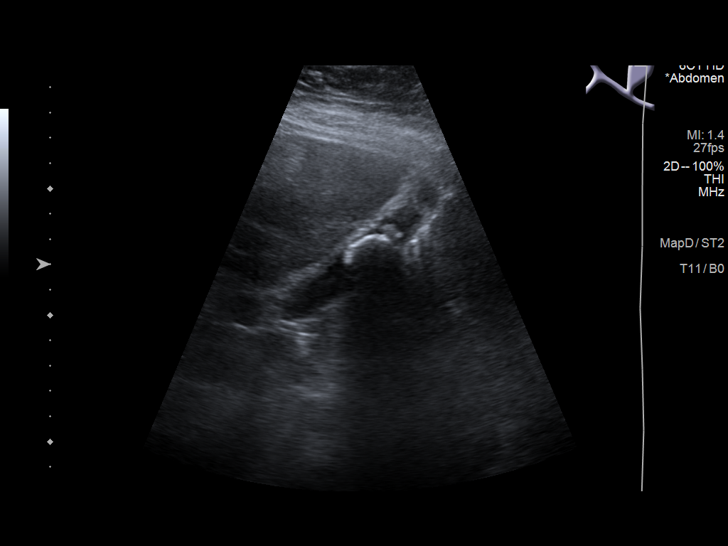
[im 5/49]
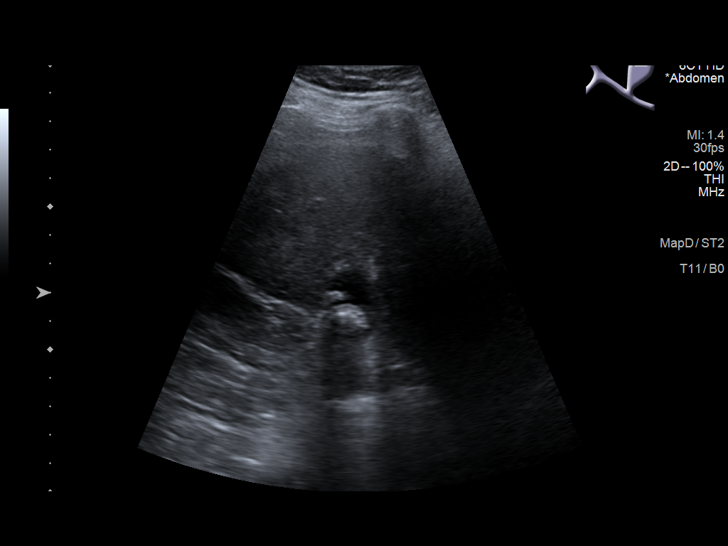
[im 9/49]
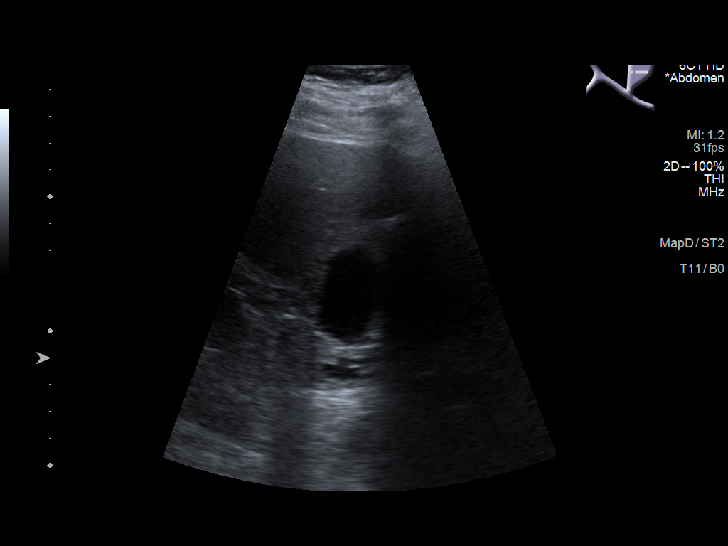
[im 13/49]
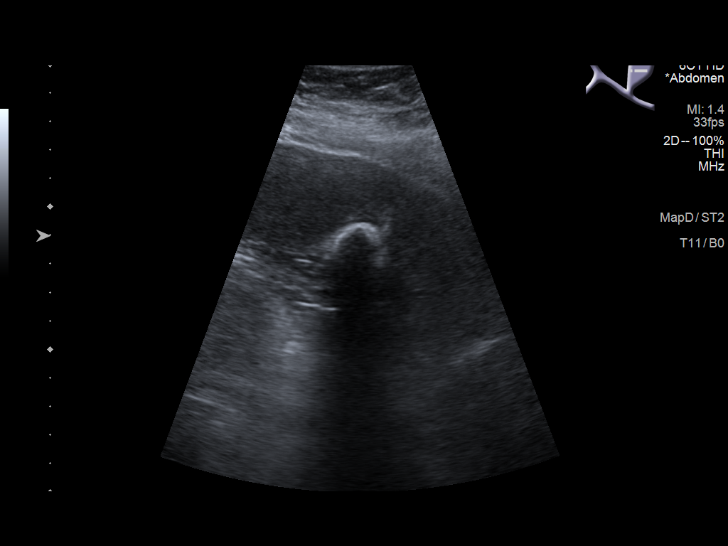
[im 17/49]
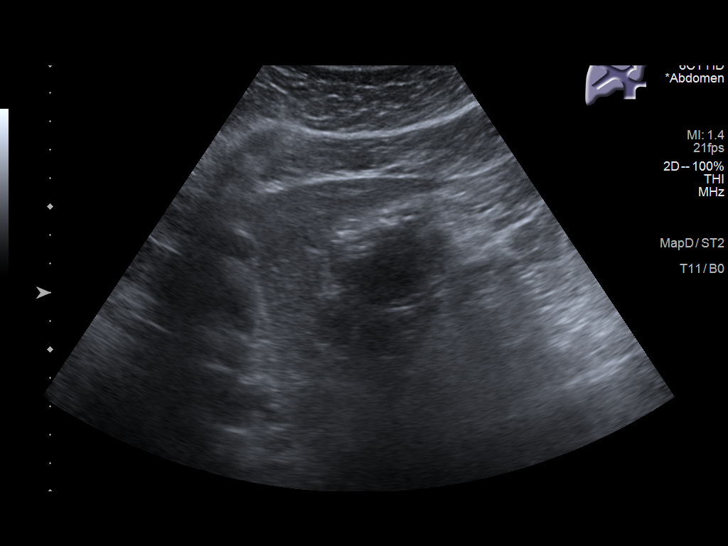
[im 19/49]
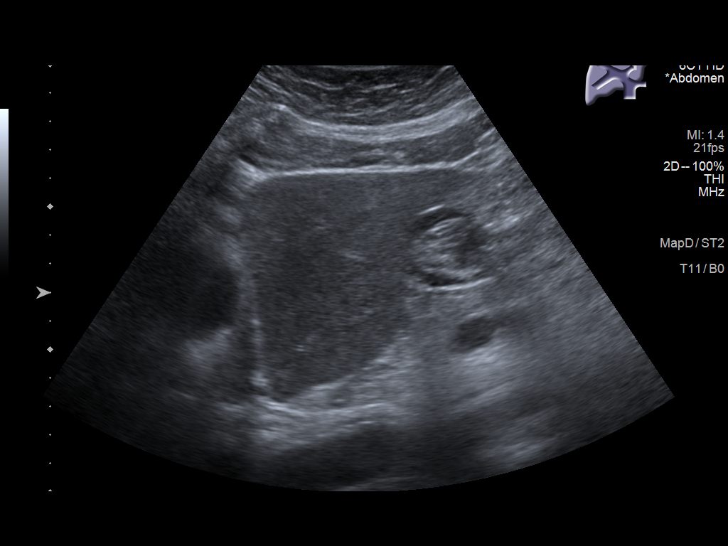
[im 23/49]
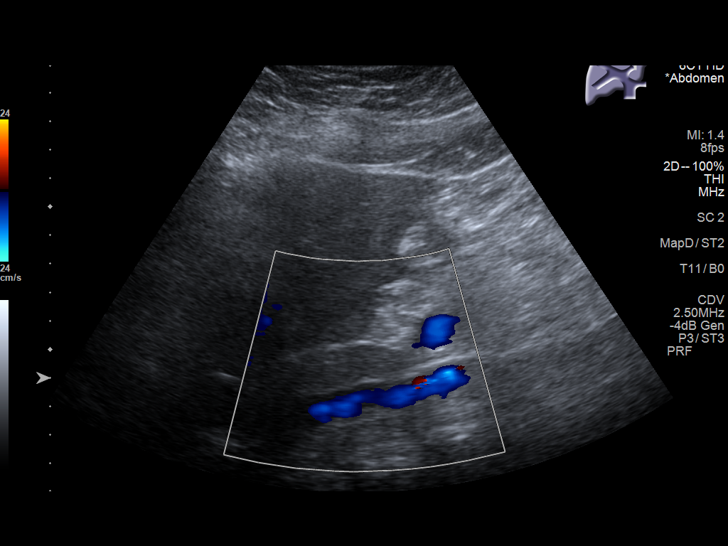
[im 27/49]
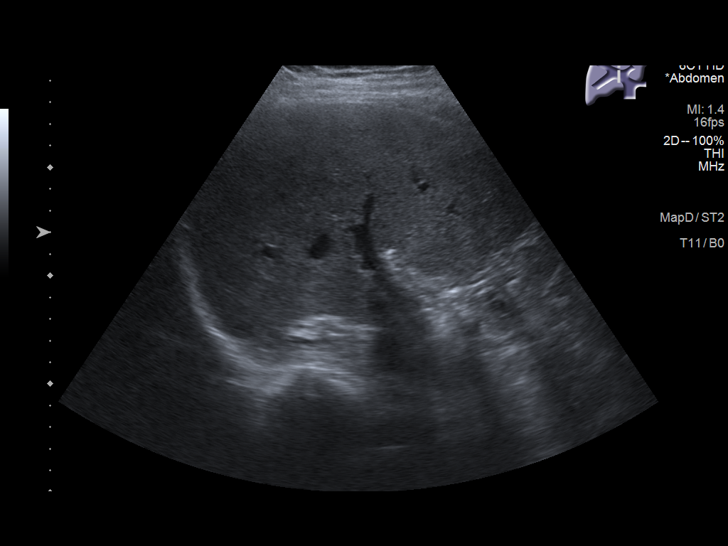
[im 31/49]
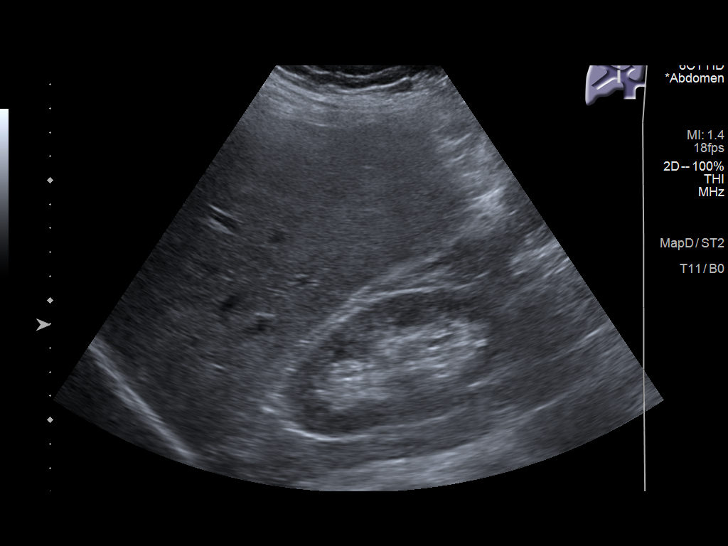
[im 33/49]
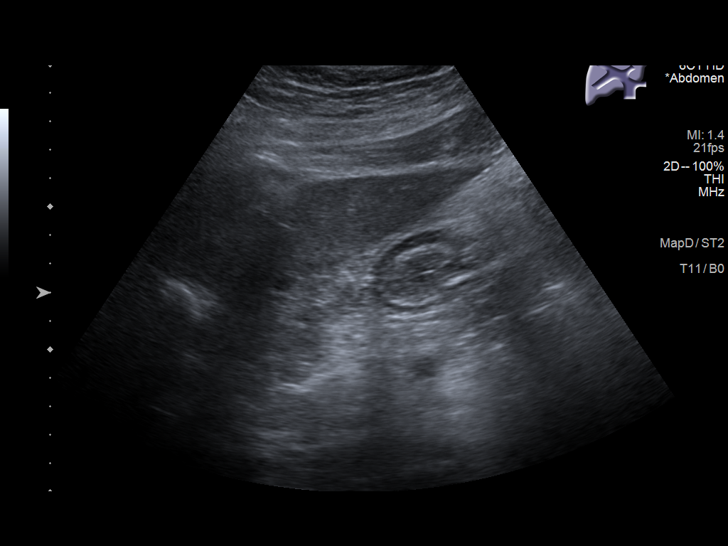
[im 37/49]
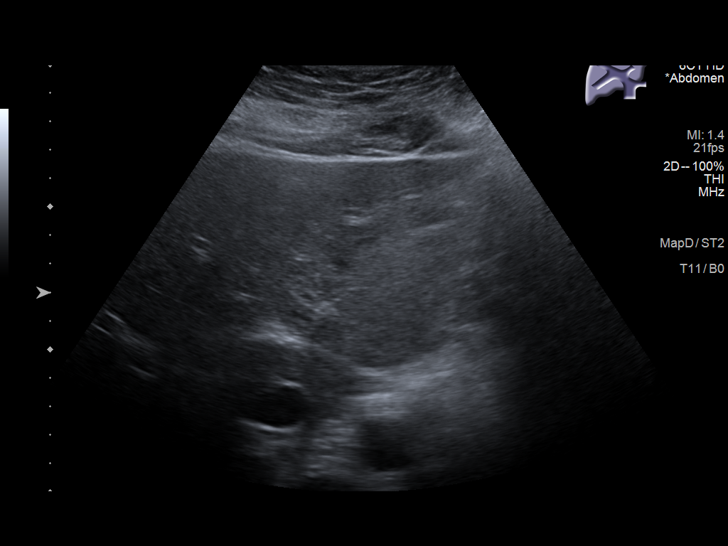
[im 41/49]
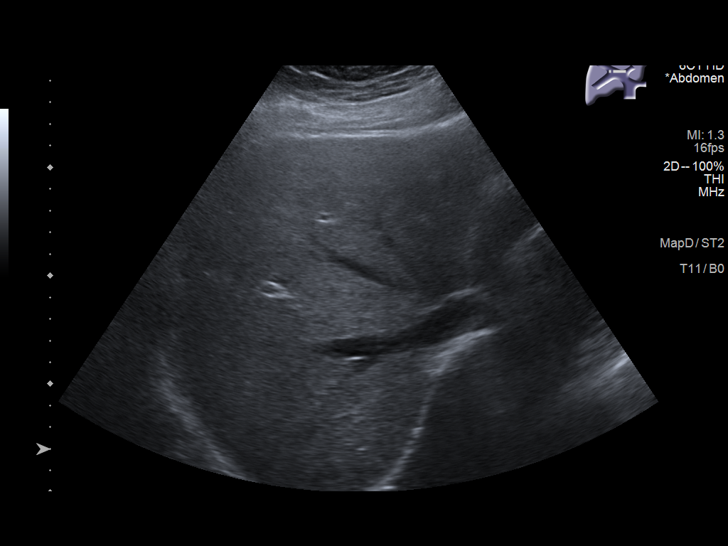
[im 45/49]
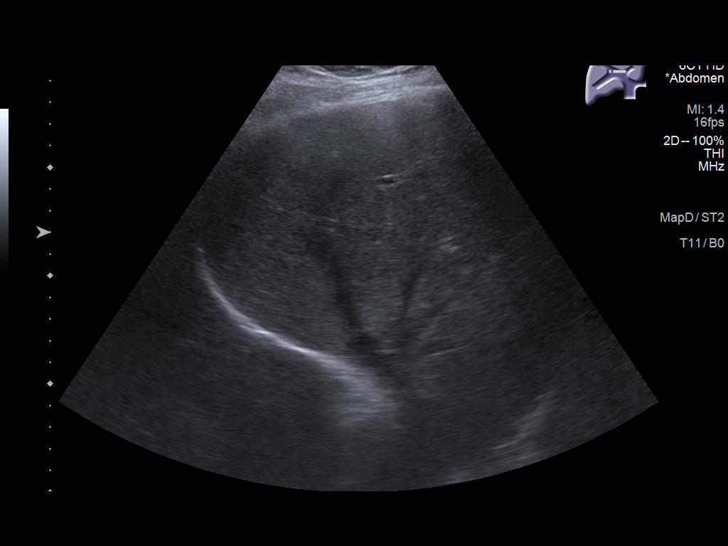
[im 49/49]
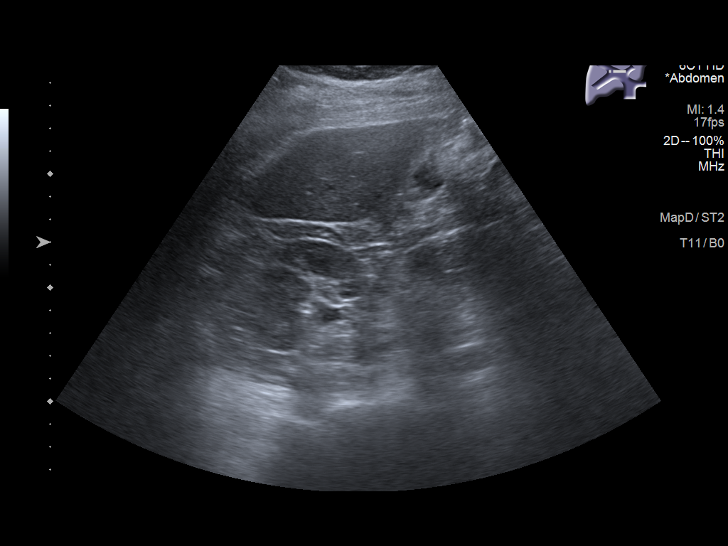

[14 of 25 positions shown; findings below may reference images not displayed]

FINDINGS: Gallbladder:

Multiple shadowing calcified gallstones in the nondistended
gallbladder, largest 1.9 cm. No gallbladder wall thickening. No
sonographic Murphy's sign reported. No pericholecystic fluid.

Common bile duct:

Diameter: 3 mm

Liver:

No focal lesion identified. Within normal limits in parenchymal
echogenicity. Portal vein is patent on color Doppler imaging with
normal direction of blood flow towards the liver.

Other: None.
IMPRESSION: 1. Cholelithiasis.  No evidence of acute cholecystitis.
2. No biliary ductal dilatation.
3. Normal liver.

## 2021-12-09 ENCOUNTER — Telehealth: Payer: Self-pay

## 2021-12-09 NOTE — Telephone Encounter (Signed)
-----   Message from Ralene Bathe, MD sent at 12/01/2021  7:08 PM EDT ----- Diagnosis 1. Skin , right hand dorsum BASAL CELL CARCINOMA, NODULAR PATTERN 2. Skin , left ant neck SEBORRHEIC KERATOSIS, IRRITATED  1- Cancer - BCC Already treated Recheck next visit 2- benign irritated keratosis No further treatment needed

## 2021-12-09 NOTE — Telephone Encounter (Signed)
Advised pt of bx results/sh ?

## 2021-12-23 ENCOUNTER — Ambulatory Visit (HOSPITAL_COMMUNITY)
Admission: RE | Admit: 2021-12-23 | Discharge: 2021-12-23 | Disposition: A | Discharge status: Home / Self Care | Payer: 59 | Source: Ambulatory Visit | Attending: Urology | Admitting: Urology

## 2021-12-23 ENCOUNTER — Other Ambulatory Visit (HOSPITAL_COMMUNITY): Payer: Self-pay | Admitting: Urology

## 2021-12-23 DIAGNOSIS — C678 Malignant neoplasm of overlapping sites of bladder: Secondary | ICD-10-CM | POA: Insufficient documentation

## 2021-12-23 DIAGNOSIS — R0602 Shortness of breath: Secondary | ICD-10-CM | POA: Diagnosis not present

## 2022-01-11 DIAGNOSIS — C678 Malignant neoplasm of overlapping sites of bladder: Secondary | ICD-10-CM | POA: Diagnosis not present

## 2022-01-11 DIAGNOSIS — N760 Acute vaginitis: Secondary | ICD-10-CM | POA: Diagnosis not present

## 2022-01-15 ENCOUNTER — Encounter: Payer: Self-pay | Admitting: Family Medicine

## 2022-01-27 ENCOUNTER — Ambulatory Visit (INDEPENDENT_AMBULATORY_CARE_PROVIDER_SITE_OTHER): Payer: 59 | Admitting: Dermatology

## 2022-01-27 DIAGNOSIS — Z79899 Other long term (current) drug therapy: Secondary | ICD-10-CM

## 2022-01-27 DIAGNOSIS — L578 Other skin changes due to chronic exposure to nonionizing radiation: Secondary | ICD-10-CM | POA: Diagnosis not present

## 2022-01-27 DIAGNOSIS — Z85828 Personal history of other malignant neoplasm of skin: Secondary | ICD-10-CM

## 2022-01-27 DIAGNOSIS — B351 Tinea unguium: Secondary | ICD-10-CM

## 2022-01-27 MED ORDER — TERBINAFINE HCL 250 MG PO TABS: ORAL_TABLET | ORAL | 1 refills | Status: DC

## 2022-01-27 NOTE — Progress Notes (Signed)
   Follow-Up Visit   Subjective  Susan W Hasting is a 56 y.o. female who presents for the following: Follow-up (Tinea unguium follow up - Terbinafine 250 mg x 1 month - some improvement) and Basal Cell Carcinoma (Biopsy proven BCC of right dorsum hand treated with shave removal and EDC).  The following portions of the chart were reviewed this encounter and updated as appropriate:   Tobacco  Allergies  Meds  Problems  Med Hx  Surg Hx  Fam Hx     Review of Systems:  No other skin or systemic complaints except as noted in HPI or Assessment and Plan.  Objective  Well appearing patient in no apparent distress; mood and affect are within normal limits.  A focused examination was performed including right hand, right foot. Relevant physical exam findings are noted in the Assessment and Plan.  Right dorsum hand Well healed EDC site   Assessment & Plan  Tinea unguium Bilateral feet and nails Chronic and persistent condition with duration or expected duration over one year. Condition is symptomatic / bothersome to patient. Not to goal.  Some improvement with 1 month of terbinafine. Continue terbinafine 250 mg 1 po qd x 2 more months  terbinafine (LAMISIL) 250 MG tablet - Bilateral feet and nails Take one tab po QD  History of basal cell carcinoma (BCC) Right dorsum hand Clear. Observe for recurrence. Call clinic for new or changing lesions.  Recommend regular skin exams, daily broad-spectrum spf 30+ sunscreen use, and photoprotection.    Actinic Damage - chronic, secondary to cumulative UV radiation exposure/sun exposure over time - diffuse scaly erythematous macules with underlying dyspigmentation - Recommend daily broad spectrum sunscreen SPF 30+ to sun-exposed areas, reapply every 2 hours as needed.  - Recommend staying in the shade or wearing long sleeves, sun glasses (UVA+UVB protection) and wide brim hats (4-inch brim around the entire circumference of the hat). - Call for new  or changing lesions.  Return in about 1 year (around 01/28/2023) for TBSE.  I, Ashok Cordia, CMA, am acting as scribe for Sarina Ser, MD . Documentation: I have reviewed the above documentation for accuracy and completeness, and I agree with the above.  Sarina Ser, MD

## 2022-01-27 NOTE — Patient Instructions (Signed)
Due to recent changes in healthcare laws, you Beezley see results of your pathology and/or laboratory studies on MyChart before the doctors have had a chance to review them. We understand that in some cases there Aull be results that are confusing or concerning to you. Please understand that not all results are received at the same time and often the doctors Rosengrant need to interpret multiple results in order to provide you with the best plan of care or course of treatment. Therefore, we ask that you please give [place] 2 business days to thoroughly review all your results before contacting the office for clarification. Should we see a critical lab result, you will be contacted sooner.   If You Need Anything After Your Visit  If you have any questions or concerns for your doctor, please call our main line at [phone] and press option 4 to reach your doctor's medical assistant. If no one answers, please leave a voicemail as directed and we will return your call as soon as possible. Messages left after 4 pm will be answered the following business day.   You Metayer also send [place] a message via [place]. We typically respond to MyChart messages within 1-2 business days.  For prescription refills, please ask your pharmacy to contact our office. Our fax number is [phone].  If you have an urgent issue when the clinic is closed that cannot wait until the next business day, you can page your doctor at the number below.    Please note that while we do our best to be available for urgent issues outside of office hours, we are not available 24/7.   If you have an urgent issue and are unable to reach [place], you Fisch choose to seek medical care at your doctor's office, retail clinic, urgent care center, or emergency room.  If you have a medical emergency, please immediately call 911 or go to the emergency department.  Pager Numbers  - Dr. Nehemiah Massed: [phone]  - Dr. Laurence Ferrari: [phone]  - Dr. Nicole Kindred:  [phone]  In the event of inclement weather, please call our main line at [phone] for an update on the status of any delays or closures.  Dermatology Medication Tips: Please keep the boxes that topical medications come in in order to help keep track of the instructions about where and how to use these. Pharmacies typically print the medication instructions only on the boxes and not directly on the medication tubes.   If your medication is too expensive, please contact our office at [phone] option 4 or send [place] a message through [place].   We are unable to tell what your co-pay for medications will be in advance as this is different depending on your insurance coverage. However, we Heffner be able to find a substitute medication at lower cost or fill out paperwork to get insurance to cover a needed medication.   If a prior authorization is required to get your medication covered by your insurance company, please allow [place] 1-2 business days to complete this process.  Drug prices often vary depending on where the prescription is filled and some pharmacies Miah offer cheaper prices.  The website www.goodrx.com contains coupons for medications through different pharmacies. The prices here do not account for what the cost Mccravy be with help from insurance (it Saintil be cheaper with your insurance), but the website can give you the price if you did not use any insurance.  - You can print the associated coupon and take it with  your prescription to the pharmacy.  - You Sunderlin also stop by our office during regular business hours and pick up a GoodRx coupon card.  - If you need your prescription sent electronically to a different pharmacy, notify our office through [facility] or by phone at [phone] option 4.     Si Usted Necesita Algo Despus de Su Visita  Tambin puede enviarnos un mensaje a travs de Pharmacist, community. Por lo general respondemos a los mensajes de MyChart en el transcurso de 1 a 2  das hbiles.  Para renovar recetas, por favor pida a su farmacia que se ponga en contacto con nuestra oficina. Harland Dingwall de fax es [place] [phone].  Si tiene un asunto urgente cuando la clnica est cerrada y que no puede esperar hasta el siguiente da hbil, puede llamar/localizar a su doctor(a) al nmero que aparece a continuacin.   Por favor, tenga en cuenta que aunque hacemos todo lo posible para estar disponibles para asuntos urgentes fuera del horario de [place], no estamos disponibles las 24 horas del da, los 7 das de la [place].   Si tiene un problema urgente y no puede comunicarse con nosotros, puede optar por buscar atencin mdica  en el consultorio de su doctor(a), en una clnica privada, en un centro de atencin urgente o en una sala de emergencias.  Si tiene Engineering geologist, por favor llame inmediatamente al 911 o vaya a la sala de emergencias.  Nmeros de bper  - Dr. Nehemiah Massed: [phone]  - Dra. Moye: [phone]  - Dra. Nicole Kindred: [phone]  En caso de inclemencias del [place], por favor llame a Johnsie Kindred principal al [phone] para una actualizacin sobre el [place] de cualquier retraso o cierre.  Consejos para la medicacin en dermatologa: Por favor, guarde las cajas en las que vienen los medicamentos de uso tpico para ayudarle a seguir las instrucciones sobre dnde y cmo usarlos. Las farmacias generalmente imprimen las instrucciones del medicamento slo en las cajas y no directamente en los tubos del [place].   Si su medicamento es muy caro, por favor, pngase en contacto con Zigmund Daniel llamando al [phone] y presione la opcin 4 o envenos un mensaje a travs de Pharmacist, community.   No podemos decirle cul ser su copago por los medicamentos por adelantado ya que esto es diferente dependiendo de la cobertura de su seguro. Sin embargo, es posible que podamos encontrar un medicamento sustituto a Electrical engineer un formulario para que el  seguro cubra el medicamento que se considera necesario.   Si se requiere una autorizacin previa para que su compaa de seguros [place] su medicamento, por favor permtanos de 1 a 2 das hbiles para completar este proceso.  Los precios de los medicamentos varan con frecuencia dependiendo del Environmental consultant de dnde se surte la receta y alguna farmacias pueden ofrecer precios ms baratos.  El sitio web www.goodrx.com tiene cupones para medicamentos de Airline pilot. Los precios aqu no tienen en cuenta lo que podra costar con la ayuda del seguro (puede ser ms barato con su seguro), pero el sitio web puede darle el precio si no utiliz Research scientist (physical sciences).  - Puede imprimir el cupn correspondiente y llevarlo con su receta a la farmacia.  - Tambin puede pasar por nuestra oficina durante el horario de atencin regular y Charity fundraiser una tarjeta de cupones de GoodRx.  - Si necesita que su receta se enve electrnicamente a Chiropodist, informe a nuestra oficina a travs [place]  o por telfono llamando al [phone] y presione la opcin 4.

## 2022-02-01 ENCOUNTER — Other Ambulatory Visit: Payer: Self-pay

## 2022-02-01 ENCOUNTER — Encounter: Payer: Self-pay | Admitting: *Deleted

## 2022-02-01 DIAGNOSIS — R110 Nausea: Secondary | ICD-10-CM | POA: Insufficient documentation

## 2022-02-01 DIAGNOSIS — R109 Unspecified abdominal pain: Secondary | ICD-10-CM | POA: Diagnosis not present

## 2022-02-01 DIAGNOSIS — Z5321 Procedure and treatment not carried out due to patient leaving prior to being seen by health care provider: Secondary | ICD-10-CM | POA: Diagnosis not present

## 2022-02-01 NOTE — ED Triage Notes (Signed)
Pt reports abd pain.  Pt has a urostomy.  Pt reports no urine output since 2000 tonight.  Pt has nausea.  Pt alert  speech clear

## 2022-02-02 ENCOUNTER — Emergency Department
Admission: EM | Admit: 2022-02-02 | Discharge: 2022-02-02 | Discharge status: Against Medical Advice | Payer: 59 | Attending: Emergency Medicine | Admitting: Emergency Medicine

## 2022-02-02 LAB — BASIC METABOLIC PANEL
Anion gap: 7 (ref 5–15)
BUN: 18 mg/dL (ref 6–20)
CO2: 28 mmol/L (ref 22–32)
Calcium: 9.1 mg/dL (ref 8.9–10.3)
Chloride: 106 mmol/L (ref 98–111)
Creatinine, Ser: 1.3 mg/dL — ABNORMAL HIGH (ref 0.44–1.00)
GFR, Estimated: 48 mL/min — ABNORMAL LOW (ref >60)
Glucose, Bld: 148 mg/dL — ABNORMAL HIGH (ref 70–99)
Potassium: 3.7 mmol/L (ref 3.5–5.1)
Sodium: 141 mmol/L (ref 135–145)

## 2022-02-02 LAB — CBC
HCT: 40.8 % (ref 36.0–46.0)
Hemoglobin: 12.7 g/dL (ref 12.0–15.0)
MCH: 27.4 pg (ref 26.0–34.0)
MCHC: 31.1 g/dL (ref 30.0–36.0)
MCV: 87.9 fL (ref 80.0–100.0)
Platelets: 297 10*3/uL (ref 150–400)
RBC: 4.64 MIL/uL (ref 3.87–5.11)
RDW: 15.5 % (ref 11.5–15.5)
WBC: 9.8 10*3/uL (ref 4.0–10.5)
nRBC: 0 % (ref 0.0–0.2)

## 2022-02-03 ENCOUNTER — Other Ambulatory Visit: Payer: Self-pay | Admitting: Family Medicine

## 2022-02-03 DIAGNOSIS — I10 Essential (primary) hypertension: Secondary | ICD-10-CM

## 2022-02-04 ENCOUNTER — Encounter: Payer: Self-pay | Admitting: Family Medicine

## 2022-02-04 NOTE — Progress Notes (Signed)
[name], female    DOB: [birth year], 56 y.o.   MRN: [id number]  PCP: Delsa Grana, PA-C  Chief Complaint  Patient presents with   Edema    Neck on right side looks like a lump as well. Pt notice on Monday and feels like it has grown as days gone by. Pt states last night started hurting.    Subjective:   Susan Snyder is a 56 y.o. female, presents to clinic with CC of the following:  HPI  Pt presents for concern of swelling in her low neck near clavicle, only noticed a few days ago, its swollen, tender to the touch, worse with palpation or movement. Hurts shooting up into right ear, and through right side of chest and through to her back/scapula and upper right trapezius area Sore with palpation, shooting burning  When she swallows it hurts in ear but no real sore throat or nasal sx, no recent illness or known sick contacts  Reviewed recent imaging, CXR done by Dr. Tammi Klippel -IMPRESSION: No active cardiopulmonary disease. No evidence of pneumonia or pulmonary edema. No evidence of intrathoracic metastasis.     Patient Active Problem List   Diagnosis Date Noted   Presence of urostomy ([place]) 02/05/2022   History of colonic polyps    Polyp of transverse colon    Bladder cancer ([place]) 02/20/2020   Mixed hyperlipidemia 12/20/2019   History of recurrent UTIs 08/01/2019   Major depressive disorder with current active episode 08/01/2019   Status post laparoscopic cholecystectomy 04/19/2019   Status post laparoscopic appendectomy 04/03/2019   Vitamin B12 deficiency 01/14/2015   Vitamin D deficiency 01/14/2015   Class 2 severe obesity with serious comorbidity and body mass index (BMI) of 37.0 to 37.9 in adult [facility]) 01/14/2015   Hx of iron deficiency anemia 01/14/2015   Hypertension    Collagenous colitis    H/O bilateral hip replacements 06/28/2014   Malabsorption due to intolerance, not elsewhere classified 01/02/2014   Lumbar disc herniation with radiculopathy  05/22/2013   Cervical spondylosis 02/06/2013   Intervertebral cervical disc disorder with myelopathy, cervical region 02/06/2013      Current Outpatient Medications:    aspirin EC 81 MG tablet, Take 1 tablet (81 mg total) by mouth daily., Disp: , Rfl:    atorvastatin (LIPITOR) 10 MG tablet, Take 1 tablet (10 mg total) by mouth at bedtime., Disp: 90 tablet, Rfl: 2   buPROPion (WELLBUTRIN XL) 150 MG 24 hr tablet, TAKE 1 TABLET (150 MG TOTAL) BY MOUTH IN THE MORNING AND AT BEDTIME., Disp: 60 tablet, Rfl: 0   metoprolol succinate (TOPROL-XL) 25 MG 24 hr tablet, TAKE 1 TABLET BY MOUTH EVERYDAY AT BEDTIME, Disp: 30 tablet, Rfl: 0   pantoprazole (PROTONIX) 40 MG tablet, TAKE 1 TABLET BY MOUTH DAILY BEFORE BREAKFAST, Disp: 90 tablet, Rfl: 1   terbinafine (LAMISIL) 250 MG tablet, Take one tab po QD, Disp: 30 tablet, Rfl: 1   VITAMIN D PO, Take by mouth., Disp: , Rfl:    calcium carbonate (TUMS - DOSED IN MG ELEMENTAL CALCIUM) 500 MG chewable tablet, Chew 1 tablet by mouth daily as needed for indigestion or heartburn.  (Patient not taking: Reported on 02/05/2022), Disp: , Rfl:    ciprofloxacin (CIPRO) 500 MG tablet, Take 500 mg by mouth 2 (two) times daily. (Patient not taking: Reported on 02/05/2022), Disp: , Rfl:    Cyanocobalamin (VITAMIN B-12 PO), Take by mouth. (Patient not taking: Reported on 02/05/2022), Disp: , Rfl:  Allergies  Allergen Reactions   Adhesive [Tape] Other (See Comments)    Blisters   Gabapentin Nausea And Vomiting   Hydrocodone-Acetaminophen Nausea And Vomiting    hallucinations   Keflex [Cephalexin] Shortness Of Breath and Palpitations   Sulfa Antibiotics Shortness Of Breath and Palpitations   Penicillins Rash    Did it involve swelling of the face/tongue/throat, SOB, or low BP? No Did it involve sudden or severe rash/hives, skin peeling, or any reaction on the inside of your mouth or nose? No Did you need to seek medical attention at a hospital or doctor's office?  No When did it last happen?      Childhood If all above answers are "NO", Forgione proceed with cephalosporin use.      Social History   Tobacco Use   Smoking status: Former    Packs/day: 1.00    Years: 30.00    Total pack years: 30.00    Types: Cigarettes    Quit date: 09/22/2019    Years since quitting: 2.3   Smokeless tobacco: Never  Vaping Use   Vaping Use: Former   Substances: Nicotine  Substance Use Topics   Alcohol use: Not Currently    Comment: rare   Drug use: No      Chart Review Today: I personally reviewed active problem list, medication list, allergies, family history, social history, health maintenance, notes from last encounter, lab results, imaging with the patient/caregiver today.   Review of Systems  Constitutional: Negative.  Negative for activity change, appetite change, chills, diaphoresis, fatigue, fever and unexpected weight change.  HENT: Negative.    Eyes: Negative.   Respiratory: Negative.  Negative for cough, chest tightness, shortness of breath and wheezing.   Cardiovascular:  Positive for chest pain. Negative for palpitations and leg swelling.  Gastrointestinal: Negative.  Negative for abdominal distention, abdominal pain, diarrhea, nausea and vomiting.  Endocrine: Negative.   Genitourinary: Negative.   Musculoskeletal:  Positive for back pain, myalgias, neck pain and neck stiffness.  Skin: Negative.  Negative for color change, pallor and rash.  Allergic/Immunologic: Negative.   Neurological: Negative.  Negative for dizziness, weakness and numbness.  Hematological:  Positive for adenopathy.  Psychiatric/Behavioral: Negative.    All other systems reviewed and are negative.      Objective:   Vitals:   02/05/22 1529 02/05/22 1543  BP: (!) 148/86 (!) 142/78  Pulse: 90   Resp: 16   Temp: 98.3 F (36.8 C)   TempSrc: Oral   SpO2: 98%   Weight: 249 lb 4.8 oz (113.1 kg)   Height: 5' 8"  (1.727 m)     Body mass index is 37.91  kg/m.  Physical Exam Vitals and nursing note reviewed.  Constitutional:      General: She is not in acute distress.    Appearance: Normal appearance. She is well-developed. She is obese. She is not ill-appearing, toxic-appearing or diaphoretic.  HENT:     Head: Normocephalic and atraumatic.     Jaw: There is normal jaw occlusion.     Right Ear: Hearing, tympanic membrane, ear canal and external ear normal. No middle ear effusion.     Left Ear: Hearing, tympanic membrane, ear canal and external ear normal.  No middle ear effusion.     Nose: Nose normal. No mucosal edema, congestion or rhinorrhea.     Right Turbinates: Not enlarged or swollen.     Left Turbinates: Not enlarged or swollen.     Mouth/Throat:     Mouth:  Mucous membranes are moist.     Pharynx: Oropharynx is clear. Uvula midline. No pharyngeal swelling, oropharyngeal exudate, posterior oropharyngeal erythema or uvula swelling.     Tonsils: No tonsillar exudate or tonsillar abscesses. 0 on the right. 0 on the left.  Eyes:     General:        Right eye: No discharge.        Left eye: No discharge.     Conjunctiva/sclera: Conjunctivae normal.  Neck:     Thyroid: No thyroid mass, thyromegaly or thyroid tenderness.     Trachea: Trachea and phonation normal. No tracheal tenderness or tracheal deviation.  Cardiovascular:     Rate and Rhythm: Normal rate and regular rhythm.     Pulses: Normal pulses.     Heart sounds: Normal heart sounds.  Pulmonary:     Effort: Pulmonary effort is normal. No tachypnea, accessory muscle usage, respiratory distress or retractions.     Breath sounds: Normal breath sounds. No stridor, decreased air movement or transmitted upper airway sounds. No decreased breath sounds, wheezing, rhonchi or rales.  Chest:     Chest wall: Swelling and tenderness present. No crepitus. There is no dullness to percussion.  Breasts:    Breasts are symmetrical.     Comments: Right supraclavicular area with swelling  no discrete lymphadenopathy Musculoskeletal:     Cervical back: Edema present. No erythema or crepitus. Pain with movement and muscular tenderness present. Decreased range of motion.     Right lower leg: No edema.     Left lower leg: No edema.  Lymphadenopathy:     Head:     Right side of head: No submental, submandibular or tonsillar adenopathy.     Left side of head: No submental, submandibular or tonsillar adenopathy.     Cervical: No cervical adenopathy.  Skin:    General: Skin is warm and dry.     Coloration: Skin is not jaundiced or pale.     Findings: No bruising, erythema or rash.  Neurological:     Mental Status: She is alert. Mental status is at baseline.     Motor: No abnormal muscle tone.     Coordination: Coordination normal.  Psychiatric:        Behavior: Behavior normal.      Results for orders placed or performed during the hospital encounter of 18/56/31  Basic metabolic panel  Result Value Ref Range   Sodium 141 135 - 145 mmol/L   Potassium 3.7 3.5 - 5.1 mmol/L   Chloride 106 98 - 111 mmol/L   CO2 28 22 - 32 mmol/L   Glucose, Bld 148 (H) 70 - 99 mg/dL   BUN 18 6 - 20 mg/dL   Creatinine, Ser 1.30 (H) 0.44 - 1.00 mg/dL   Calcium 9.1 8.9 - 10.3 mg/dL   GFR, Estimated 48 (L) >60 mL/min   Anion gap 7 5 - 15  CBC  Result Value Ref Range   WBC 9.8 4.0 - 10.5 K/uL   RBC 4.64 3.87 - 5.11 MIL/uL   Hemoglobin 12.7 12.0 - 15.0 g/dL   HCT 40.8 36.0 - 46.0 %   MCV 87.9 80.0 - 100.0 fL   MCH 27.4 26.0 - 34.0 pg   MCHC 31.1 30.0 - 36.0 g/dL   RDW 15.5 11.5 - 15.5 %   Platelets 297 150 - 400 K/uL   nRBC 0.0 0.0 - 0.2 %       Assessment & Plan:   1. Neck pain  on right side Ttp to right SCM w/o cervical lymphadenopathy, some lateral and cervical paraspinal and upper trapezius muscle tenderness to palpation with limited rotation.  Palpation of the clavicle on the left there is some fullness but no tenderness midclavicular to sternoclavicular joint however on the  right with soft area of fullness it is very tender from about the midclavicular line to sternoclavicular joint just with palpation to the soft tissues or with trying to palpate the clavicle I did not feel any discrete lymph nodes or masses She is having some pain with swallowing, trachea midline, airway patent, no stridor or wheeze no difficulty with swallowing speaking or eating or drinking Possible that it could be muscle skeletal or a cervical radiculopathy with radiation that she states goes to her chest and then during visit she noted that it started to radiate to her right shoulder and right upper back, worse with breathing but she had normal breath sounds there is no crepitus no flail chest - DG Neck Soft Tissue  2. Supraclavicular fossa fullness Screening chest x-ray and plain films of neck but it ultrasound to be helpful if plain films are negative - DG Neck Soft Tissue  3. Pleuritic chest pain Pain that shooting up and down the right chest to clavicle scapula and right shoulder reproducible here with deep inspiration movement and palpation, she has normal respiratory rate, normal breath sounds, she is afebrile without hypoxia or tachycardia With the way that the pain was first felt to supraclavicularly and then began to be painful with inspiration and chest x-ray will be done to make sure there is no free air or small pneumothorax?  She did recently have her urostomy blocked for many hours just recently and around the same time she had the symptoms begin and then gradually worsened over the last couple days (ER visit 11/14, her obstructed urostomy outflow self corrected prior to being seen so she left without being seen after waiting many hours, she noted that tenderness and symptoms around the time that she left the ER and she came here 3 days later for evaluation. - DG Chest 2 View  4. Malignant neoplasm of urinary bladder, unspecified site [facility]) She did recently have follow-up CT abdomen  pelvis and screening chest x-ray I can see the x-ray and have reviewed this personally but I cannot see the CT scan I have also reviewed the urology records today in EMR but no CT report is available per her urologist her CT was normal  5. Presence of urostomy [facility]) Recent urostomy issues immediately preceding the onset of her current symptoms  Explained to the patient that if plain film x-rays do not reveal anything and if her symptoms worsen she Mcbreen need to go to urgent care or ER over the weekend as it is Friday after 4 PM and I cannot get any stat imaging approved or scheduled at this time.  Most concerning would be if she has tracheal deviation difficulty swallowing breathing, stridor, pleuritic chest pain with dyspnea or tachycardia she should go right away to an ER for further evaluation  She continues to have supraclavicular fullness which is difficult to characterize with my exam and documentation today... Is diffusely tender soft and full without edema induration erythema or any discrete palpable masses palpating over the clavicle is very tender on the right side, there is no cervical lymphadenopathy she has no concerning URI symptoms no known sick exposures -the discomfort continues Bantz want to consider getting a ultrasound of the  soft tissues or CT of her neck    Delsa Grana, PA-C 02/05/22 4:53 PM

## 2022-02-05 ENCOUNTER — Ambulatory Visit
Admission: RE | Admit: 2022-02-05 | Discharge: 2022-02-05 | Disposition: A | Discharge status: Home / Self Care | Payer: 59 | Attending: Family Medicine | Admitting: Family Medicine

## 2022-02-05 ENCOUNTER — Ambulatory Visit
Admission: RE | Admit: 2022-02-05 | Discharge: 2022-02-05 | Disposition: A | Discharge status: Home / Self Care | Payer: 59 | Source: Ambulatory Visit | Attending: Family Medicine | Admitting: Family Medicine

## 2022-02-05 ENCOUNTER — Encounter: Payer: Self-pay | Admitting: Family Medicine

## 2022-02-05 ENCOUNTER — Ambulatory Visit (INDEPENDENT_AMBULATORY_CARE_PROVIDER_SITE_OTHER): Payer: Self-pay | Admitting: Family Medicine

## 2022-02-05 ENCOUNTER — Encounter: Payer: Self-pay | Admitting: Dermatology

## 2022-02-05 VITALS — BP 142/78 | HR 90 | Temp 98.3°F | Resp 16 | Ht 68.0 in | Wt 249.3 lb

## 2022-02-05 DIAGNOSIS — R222 Localized swelling, mass and lump, trunk: Secondary | ICD-10-CM | POA: Diagnosis not present

## 2022-02-05 DIAGNOSIS — M542 Cervicalgia: Secondary | ICD-10-CM | POA: Insufficient documentation

## 2022-02-05 DIAGNOSIS — R0781 Pleurodynia: Secondary | ICD-10-CM

## 2022-02-05 DIAGNOSIS — R079 Chest pain, unspecified: Secondary | ICD-10-CM | POA: Diagnosis not present

## 2022-02-05 DIAGNOSIS — M5412 Radiculopathy, cervical region: Secondary | ICD-10-CM

## 2022-02-05 DIAGNOSIS — C679 Malignant neoplasm of bladder, unspecified: Secondary | ICD-10-CM

## 2022-02-05 DIAGNOSIS — Z936 Other artificial openings of urinary tract status: Secondary | ICD-10-CM | POA: Insufficient documentation

## 2022-02-05 MED ORDER — BACLOFEN 5 MG PO TABS: 5.0000 mg | ORAL_TABLET | Freq: Three times a day (TID) | ORAL | 0 refills | Status: DC | PRN

## 2022-02-05 MED ORDER — PREDNISONE 20 MG PO TABS: ORAL_TABLET | ORAL | 0 refills | Status: DC

## 2022-02-05 NOTE — Addendum Note (Signed)
Addended by: Delsa Grana on: 02/05/2022 05:21 PM   Modules accepted: Orders

## 2022-02-28 ENCOUNTER — Other Ambulatory Visit: Payer: Self-pay | Admitting: Family Medicine

## 2022-02-28 DIAGNOSIS — I10 Essential (primary) hypertension: Secondary | ICD-10-CM

## 2022-03-03 ENCOUNTER — Ambulatory Visit: Payer: 59 | Admitting: Urology

## 2022-03-19 ENCOUNTER — Encounter: Payer: Self-pay | Admitting: Family Medicine

## 2022-03-19 ENCOUNTER — Ambulatory Visit (INDEPENDENT_AMBULATORY_CARE_PROVIDER_SITE_OTHER): Payer: Self-pay | Admitting: Family Medicine

## 2022-03-19 ENCOUNTER — Encounter: Payer: Self-pay | Admitting: Nurse Practitioner

## 2022-03-19 VITALS — BP 136/72 | HR 98 | Resp 16 | Ht 68.0 in | Wt 251.0 lb

## 2022-03-19 DIAGNOSIS — M5412 Radiculopathy, cervical region: Secondary | ICD-10-CM

## 2022-03-19 DIAGNOSIS — Z0000 Encounter for general adult medical examination without abnormal findings: Secondary | ICD-10-CM | POA: Diagnosis not present

## 2022-03-19 DIAGNOSIS — R5383 Other fatigue: Secondary | ICD-10-CM | POA: Diagnosis not present

## 2022-03-19 DIAGNOSIS — Z6837 Body mass index (BMI) 37.0-37.9, adult: Secondary | ICD-10-CM

## 2022-03-19 DIAGNOSIS — I10 Essential (primary) hypertension: Secondary | ICD-10-CM

## 2022-03-19 DIAGNOSIS — Z87891 Personal history of nicotine dependence: Secondary | ICD-10-CM

## 2022-03-19 DIAGNOSIS — E559 Vitamin D deficiency, unspecified: Secondary | ICD-10-CM

## 2022-03-19 DIAGNOSIS — E538 Deficiency of other specified B group vitamins: Secondary | ICD-10-CM

## 2022-03-19 DIAGNOSIS — Z936 Other artificial openings of urinary tract status: Secondary | ICD-10-CM

## 2022-03-19 DIAGNOSIS — N1831 Chronic kidney disease, stage 3a: Secondary | ICD-10-CM | POA: Diagnosis not present

## 2022-03-19 DIAGNOSIS — E782 Mixed hyperlipidemia: Secondary | ICD-10-CM | POA: Diagnosis not present

## 2022-03-19 DIAGNOSIS — F321 Major depressive disorder, single episode, moderate: Secondary | ICD-10-CM

## 2022-03-19 DIAGNOSIS — Z122 Encounter for screening for malignant neoplasm of respiratory organs: Secondary | ICD-10-CM

## 2022-03-19 DIAGNOSIS — R222 Localized swelling, mass and lump, trunk: Secondary | ICD-10-CM | POA: Diagnosis not present

## 2022-03-19 NOTE — Progress Notes (Signed)
Patient: Susan Snyder, Female    DOB: 09-23-1965, 56 y.o.   MRN: [id number] Delsa Grana, PA-C Visit Date: 03/19/2022  Today's Provider: Delsa Grana, PA-C   Chief Complaint  Patient presents with   Annual Exam   Subjective:   Annual physical exam:  Susan Snyder is a 56 y.o. female who presents today for complete physical exam:  Exercise/Activity:  3 d 30 min  Diet/nutrition:  healthy  Sleep:  no concerns currently  SDOH Screenings   Food Insecurity: No Food Insecurity (03/19/2022)  Housing: Low Risk  (03/19/2022)  Transportation Needs: No Transportation Needs (03/19/2022)  Utilities: Not At Risk (03/19/2022)  Alcohol Screen: Low Risk  (05/27/2021)  Depression (PHQ2-9): Low Risk  (03/19/2022)  Financial Resource Strain: Low Risk  (03/19/2022)  Physical Activity: Insufficiently Active (03/19/2022)  Social Connections: Socially Isolated (03/19/2022)  Stress: Stress Concern Present (03/19/2022)  Tobacco Use: Medium Risk (03/19/2022)    USPSTF grade A and B recommendations - reviewed and addressed today  Depression:  Phq 9 completed today by patient, was reviewed by me with patient in the room PHQ score is neg, pt feels good overall    03/19/2022    8:52 AM 02/05/2022    3:29 PM 07/22/2021    1:27 PM 05/27/2021    1:25 PM  PHQ 2/9 Scores  PHQ - 2 Score 0 0 2 4  PHQ- 9 Score 0 0 12 16      03/19/2022    8:52 AM 02/05/2022    3:29 PM 07/22/2021    1:27 PM 05/27/2021    1:25 PM 03/20/2021    2:43 PM  Depression screen PHQ 2/9  Decreased Interest 0 0 _0 Down, Depressed, Hopeless 0 0 _1 PHQ - 2 Score 0 0 _2 Altered sleeping 0 0 3 2 0  Tired, decreased energy 0 0 3 2 0  Change in appetite 0 0 1 2 0  Feeling bad or failure about yourself  0 0 3 2 0  Trouble concentrating 0 0 0 1 0  Moving slowly or fidgety/restless 0 0 0 2 0  Suicidal thoughts 0 0 0 1 0  PHQ-9 Score 0 0 _3 Difficult doing work/chores  Not difficult at all  Somewhat difficult Not  difficult at all    Alcohol screening: [place] Office Visit from 05/27/2021 in [facility] : [facility]  AUDIT-C Score 0       Immunizations and Health Maintenance: Health Maintenance  Topic Date Due   Lung Cancer Screening  01/10/2015   COVID-19 Vaccine (3 - Pfizer risk series) 01/20/2020   Zoster Vaccines- Shingrix (1 of 2) 05/08/2022 (Originally 07/19/1983)   INFLUENZA VACCINE  06/20/2022 (Originally 10/20/2021)   MAMMOGRAM  08/27/2022   DTaP/Tdap/Td (2 - Td or Tdap) 05/21/2026   COLONOSCOPY (Pts 45-64yr Insurance coverage will need to be confirmed)  06/18/2028   Hepatitis C Screening  Completed   HIV Screening  Completed   HPV VACCINES  Aged Out   PAP SMEAR-Modifier  Discontinued     Hep C Screening:  done  STD testing and prevention (HIV/chl/gon/syphilis):  see above, no additional testing desired by pt today  Intimate partner violence:  none, widowed, feels sefe  Sexual History/Pain during Intercourse: Widowed/not active  Menstrual History/LMP/Abnormal Bleeding:  none No LMP recorded. Patient has had a hysterectomy.  Oophrectomy as well  Incontinence Symptoms:  urostomy   Breast cancer: uptodate Last  Mammogram: *see HM list above BRCA gene screening:   Cervical cancer screening:  d/c  Osteoporosis:   Discussion on osteoporosis per age, including high calcium and vitamin D supplementation, weight bearing exercises Pt is not sure about current supplements for calcium/Vit D.  Skin cancer:  Hx of skin CA -  NO Discussed atypical lesions   Colorectal cancer:   Colonoscopy is UTD Discussed concerning signs and sx of CRC, pt denies concerning SX  Lung cancer:   Low Dose CT Chest recommended if Age 15-80 years, 20 pack-year currently smoking OR have quit w/in 15years. Patient does qualify.    Social History   Tobacco Use   Smoking status: Former    Packs/day: 1.00    Years: 30.00    Total pack years: 30.00    Types: Cigarettes    Quit date:  09/22/2019    Years since quitting: 2.4   Smokeless tobacco: Never  Vaping Use   Vaping Use: Former   Substances: Nicotine  Substance Use Topics   Alcohol use: Not Currently    Comment: rare   Drug use: No     Flowsheet Row Office Visit from 05/27/20[facility] in [facility]  AUDIT-C Score 0       Family History  Problem Relation Age of Onset   Stroke Mother    Glaucoma Mother    Heart disease Father    Cancer Father        prostate   Heart disease Sister    Diabetes Sister    Heart disease Sister        pace maker   Diabetes Paternal Aunt    Heart disease Paternal Aunt    Diabetes Paternal Aunt    Heart disease Paternal Aunt    Bleeding Disorder Paternal Uncle    Breast cancer Neg Hx      Blood pressure/Hypertension: BP Readings from Last 3 Encounters:  03/19/22 136/72  02/05/22 (!) 142/78  02/01/22 (!) 162/94    Weight/Obesity: Wt Readings from Last 3 Encounters:  03/19/22 251 lb (113.9 kg)  02/05/22 249 lb 4.8 oz (113.1 kg)  02/01/22 299 lb (135.6 kg)   BMI Readings from Last 3 Encounters:  03/19/22 38.16 kg/m  02/05/22 37.91 kg/m  02/01/22 44.15 kg/m     Lipids:  Lab Results  Component Value Date   CHOL 127 05/27/2021   CHOL 132 09/18/2020   CHOL 141 07/24/2019   Lab Results  Component Value Date   HDL 35 (L) 05/27/2021   HDL 38 (L) 09/18/2020   HDL 31 (L) 07/24/2019   Lab Results  Component Value Date   LDLCALC 69 05/27/2021   LDLCALC 66 09/18/2020   LDLCALC 80 07/24/2019   Lab Results  Component Value Date   TRIG 152 (H) 05/27/2021   TRIG 214 (H) 09/18/2020   TRIG 199 (H) 07/24/2019   Lab Results  Component Value Date   CHOLHDL 3.6 05/27/2021   CHOLHDL 3.5 09/18/2020   CHOLHDL 4.5 07/24/2019   No results found for: "LDLDIRECT" Based on the results of lipid panel his/her cardiovascular risk factor ( using [place] )  in the next 10 years is: The ASCVD Risk score (Arnett DK, et al., 2019) failed to calculate  for the following reasons:   The valid total cholesterol range is 130 to 320 mg/dL  Glucose:  Glucose  Date Value Ref Range Status  01/09/2014 104 (H) 65 - 99 mg/dL Final   Glucose, Bld  Date Value  Ref Range Status  02/01/2022 148 (H) 70 - 99 mg/dL Final    Comment:    Glucose reference range applies only to samples taken after fasting for at least 8 hours.  05/27/2021 86 65 - 99 mg/dL Final    Comment:    .            Fasting reference interval .   03/14/2021 114 (H) 70 - 99 mg/dL Final    Comment:    Glucose reference range applies only to samples taken after fasting for at least 8 hours.    Advanced Care Planning:  A voluntary discussion about advance care planning including the explanation and discussion of advance directives.   Discussed health care proxy and Living will, and the patient was able to identify a health care proxy as daughter?.   Patient does not know have a living will at present time.   Social History       Social History   Socioeconomic History   Marital status: Widowed    Spouse name: Not on file   Number of children: 2   Years of education: 22   Highest education level: Some college, no degree  Occupational History   Occupation: Barista  Tobacco Use   Smoking status: Former    Packs/day: 1.00    Years: 30.00    Total pack years: 30.00    Types: Cigarettes    Quit date: 09/22/2019    Years since quitting: 2.4   Smokeless tobacco: Never  Vaping Use   Vaping Use: Former   Substances: Nicotine  Substance and Sexual Activity   Alcohol use: Not Currently    Comment: rare   Drug use: No   Sexual activity: Not Currently  Other Topics Concern   Not on file  Social History Narrative   Not on file   Social Determinants of Health   Financial Resource Strain: Low Risk  (03/19/2022)   Overall Financial Resource Strain (CARDIA)    Difficulty of Paying Living Expenses: Not hard at all  Food Insecurity: No Food Insecurity (03/19/2022)    Hunger Vital Sign    Worried About Running Out of Food in the Last Year: Never true    [place] in the Last Year: Never true  Transportation Needs: No Transportation Needs (03/19/2022)   PRAPARE - Hydrologist (Medical): No    Lack of Transportation (Non-Medical): No  Physical Activity: Insufficiently Active (03/19/2022)   Exercise Vital Sign    Days of Exercise per Week: 3 days    Minutes of Exercise per Session: 30 min  Stress: Stress Concern Present (03/19/2022)   [place]    Feeling of Stress : Rather much  Social Connections: Socially Isolated (03/19/2022)   Social Connection and Isolation Panel [NHANES]    Frequency of Communication with Friends and Family: More than three times a week    Frequency of Social Gatherings with Friends and Family: Once a week    Attends Religious Services: Never    Marine scientist or Organizations: No    Attends Archivist Meetings: Never    Marital Status: Widowed    Family History        Family History  Problem Relation Age of Onset   Stroke Mother    Glaucoma Mother    Heart disease Father    Cancer Father  prostate   Heart disease Sister    Diabetes Sister    Heart disease Sister        pace maker   Diabetes Paternal Aunt    Heart disease Paternal Aunt    Diabetes Paternal Aunt    Heart disease Paternal Aunt    Bleeding Disorder Paternal Uncle    Breast cancer Neg Hx     Patient Active Problem List   Diagnosis Date Noted   Presence of urostomy ([place]) 02/05/2022   History of colonic polyps    Polyp of transverse colon    Bladder cancer ([place]) 02/20/2020   Mixed hyperlipidemia 12/20/2019   Major depressive disorder with current active episode 08/01/2019   Vitamin B12 deficiency 01/14/2015   Vitamin D deficiency 01/14/2015   Class 2 severe obesity with serious comorbidity and body mass index (BMI) of  37.0 to 37.9 in adult [facility]) 01/14/2015   Hypertension    Collagenous colitis    Malabsorption due to intolerance, not elsewhere classified 01/02/2014   Lumbar disc herniation with radiculopathy 05/22/2013   Cervical spondylosis 02/06/2013   Intervertebral cervical disc disorder with myelopathy, cervical region 02/06/2013    Past Surgical History:  Procedure Laterality Date   ABDOMINAL HYSTERECTOMY  2005   partial, still has ovaries, due to fibroids.   [place]   x 4   CHOLECYSTECTOMY N/A 04/11/2019   Procedure: LAPAROSCOPIC CHOLECYSTECTOMY;  Surgeon: Ronny Bacon, MD;  Location: [facility];  Service: General;  Laterality: N/A;   COLONOSCOPY W/ BIOPSIES  11/01/13   collagenous colitis, sigmoid polypectomy x 2; hyperplastic polyps   COLONOSCOPY WITH PROPOFOL N/A 06/18/2021   Procedure: COLONOSCOPY WITH PROPOFOL;  Surgeon: Lucilla Lame, MD;  Location: [facility];  Service: Endoscopy;  Laterality: N/A;   CYSTECTOMY N/A 02/22/2020   Procedure: ENDOSCOPY OF ILEAL CONDUIT;  Surgeon: Alexis Frock, MD;  Location: [facility];  Service: Urology;  Laterality: N/A;   CYSTOSCOPY WITH BIOPSY N/A 11/19/2019   Procedure: CYSTOSCOPY WITH BIOPSY;  Surgeon: [name], MD;  Location: [facility];  Service: Urology;  Laterality: N/A;   CYSTOSCOPY WITH INJECTION N/A 02/20/2020   Procedure: CYSTOSCOPY WITH INJECTION OF INDOCYANINE GREEN DYE;  Surgeon: Alexis Frock, MD;  Location: [facility];  Service: Urology;  Laterality: N/A;   ESOPHAGOGASTRODUODENOSCOPY  11/22/13   chronic gastritis   HIP ARTHROPLASTY Bilateral 01/15/14,03/05/14   total hip, Dr. Nilsa Nutting   LAPAROSCOPIC APPENDECTOMY N/A 08/20/2018   Procedure: APPENDECTOMY LAPAROSCOPIC;  Surgeon: Fredirick Maudlin, MD;  Location: [facility];  Service: General;  Laterality: N/A;   LYMPHADENECTOMY Bilateral 02/20/2020   Procedure: Noel Journey;  Surgeon: Alexis Frock, MD;  Location: [facility];  Service: Urology;   Laterality: Bilateral;   NECK SURGERY  2015   POLYPECTOMY  06/18/2021   Procedure: POLYPECTOMY;  Surgeon: Lucilla Lame, MD;  Location: [facility];  Service: Endoscopy;;   ROBOT ASSISTED LAPAROSCOPIC COMPLETE CYSTECT ILEAL CONDUIT N/A 02/20/2020   Procedure: XI ROBOTIC ASSISTED LAPAROSCOPIC COMPLETE CYSTECT ILEAL CONDUIT AND BILATERAL OOPHORECTOMY;  Surgeon: Alexis Frock, MD;  Location: [facility];  Service: Urology;  Laterality: N/A;  6 HRS   TRANSURETHRAL RESECTION OF BLADDER TUMOR WITH MITOMYCIN-C N/A 10/29/2019   Procedure: TRANSURETHRAL RESECTION OF BLADDER TUMOR;  Surgeon: [name], MD;  Location: [facility];  Service: Urology;  Laterality: N/A;   TRANSURETHRAL RESECTION OF BLADDER TUMOR WITH MITOMYCIN-C N/A 11/19/2019   Procedure: TRANSURETHRAL RESECTION OF BLADDER TUMOR WITH Gemcitabine;  Surgeon: Erlene Quan,  Caryl Pina, MD;  Location: [facility];  Service: Urology;  Laterality: N/A;     Current Outpatient Medications:    aspirin EC 81 MG tablet, Take 1 tablet (81 mg total) by mouth daily., Disp: , Rfl:    atorvastatin (LIPITOR) 10 MG tablet, Take 1 tablet (10 mg total) by mouth at bedtime., Disp: 90 tablet, Rfl: 2   Baclofen 5 MG TABS, Take 5 mg by mouth 3 (three) times daily as needed (msk pain/tightness or spasms neck, back, shoulders)., Disp: 30 tablet, Rfl: 0   buPROPion (WELLBUTRIN XL) 150 MG 24 hr tablet, TAKE 1 TABLET (150 MG TOTAL) BY MOUTH IN THE MORNING AND AT BEDTIME., Disp: 180 tablet, Rfl: 1   Cyanocobalamin (B-12) 1000 MCG SUBL, Place under the tongue daily., Disp: , Rfl:    metoprolol succinate (TOPROL-XL) 25 MG 24 hr tablet, TAKE 1 TABLET BY MOUTH EVERYDAY AT BEDTIME, Disp: 90 tablet, Rfl: 1   pantoprazole (PROTONIX) 40 MG tablet, TAKE 1 TABLET BY MOUTH DAILY BEFORE BREAKFAST, Disp: 90 tablet, Rfl: 1   terbinafine (LAMISIL) 250 MG tablet, Take one tab po QD, Disp: 30 tablet, Rfl: 1   VITAMIN D PO, Take by mouth., Disp: , Rfl:    predniSONE (DELTASONE) 20 MG tablet, 2  tabs poqday 1-3, 1 tabs poqday 4-6 (Patient not taking: Reported on 03/19/2022), Disp: 9 tablet, Rfl: 0  Allergies  Allergen Reactions   Adhesive [Tape] Other (See Comments)    Blisters   Gabapentin Nausea And Vomiting   Hydrocodone-Acetaminophen Nausea And Vomiting    hallucinations   Keflex [Cephalexin] Shortness Of Breath and Palpitations   Sulfa Antibiotics Shortness Of Breath and Palpitations   Penicillins Rash    Did it involve swelling of the face/tongue/throat, SOB, or low BP? No Did it involve sudden or severe rash/hives, skin peeling, or any reaction on the inside of your mouth or nose? No Did you need to seek medical attention at a hospital or doctor's office? No When did it last happen?      Childhood If all above answers are "NO", [place] proceed with cephalosporin use.     Patient Care Team: Delsa Grana, PA-C as PCP - General (Family Medicine)   Chart Review: I personally reviewed active problem list, medication list, allergies, family history, social history, health maintenance, notes from last encounter, lab results, imaging with the patient/caregiver today.   Review of Systems  Constitutional: Negative.   HENT: Negative.    Eyes: Negative.   Respiratory: Negative.    Cardiovascular: Negative.   Gastrointestinal: Negative.   Endocrine: Negative.   Genitourinary: Negative.   Musculoskeletal: Negative.   Skin: Negative.   Allergic/Immunologic: Negative.   Neurological: Negative.   Hematological: Negative.   Psychiatric/Behavioral: Negative.    All other systems reviewed and are negative.         Objective:   Vitals:  Vitals:   03/19/22 0852  BP: 136/72  Pulse: 98  Resp: 16  SpO2: 98%  Weight: 251 lb (113.9 kg)  Height: _0  (1.727 m)    Body mass index is 38.16 kg/m.  Physical Exam Vitals and nursing note reviewed.  Constitutional:      General: She is not in acute distress.    Appearance: Normal appearance. She is well-developed. She is  obese. She is not ill-appearing, toxic-appearing or diaphoretic.  HENT:     Head: Normocephalic and atraumatic.     Jaw: There is normal jaw occlusion.     Right Ear: Hearing, tympanic  membrane, ear canal and external ear normal. No middle ear effusion.     Left Ear: Hearing, tympanic membrane, ear canal and external ear normal.  No middle ear effusion.     Nose: Nose normal. No mucosal edema, congestion or rhinorrhea.     Right Turbinates: Not enlarged or swollen.     Left Turbinates: Not enlarged or swollen.     Mouth/Throat:     Mouth: Mucous membranes are moist.     Pharynx: Oropharynx is clear. Uvula midline. No pharyngeal swelling, oropharyngeal exudate, posterior oropharyngeal erythema or uvula swelling.     Tonsils: No tonsillar exudate or tonsillar abscesses. 0 on the right. 0 on the left.  Eyes:     General: Lids are normal. No scleral icterus.       Right eye: No discharge.        Left eye: No discharge.     Conjunctiva/sclera: Conjunctivae normal.  Neck:     Thyroid: No thyroid mass, thyromegaly or thyroid tenderness.     Trachea: Trachea and phonation normal. No tracheal tenderness or tracheal deviation.  Cardiovascular:     Rate and Rhythm: Normal rate and regular rhythm.     Pulses: Normal pulses.          Radial pulses are 2+ on the right side and 2+ on the left side.       Posterior tibial pulses are 2+ on the right side and 2+ on the left side.     Heart sounds: Normal heart sounds. No murmur heard.    No friction rub. No gallop.  Pulmonary:     Effort: Pulmonary effort is normal. No tachypnea, accessory muscle usage, respiratory distress or retractions.     Breath sounds: Normal breath sounds. No stridor, decreased air movement or transmitted upper airway sounds. No decreased breath sounds, wheezing, rhonchi or rales.  Chest:     Chest wall: Swelling present. No tenderness or crepitus. There is no dullness to percussion.  Breasts:    Breasts are symmetrical.      Comments: Right supraclavicular area with swelling no discrete lymphadenopathy Abdominal:     General: Bowel sounds are normal. There is no distension.     Palpations: Abdomen is soft.     Tenderness: There is no abdominal tenderness.  Musculoskeletal:        General: No deformity.     Cervical back: Normal range of motion and neck supple. Swelling (right lower anterior neck) and edema present. No deformity, erythema, rigidity, spasms, tenderness, bony tenderness or crepitus. Muscular tenderness present. Normal range of motion.     Right lower leg: No edema.     Left lower leg: No edema.  Lymphadenopathy:     Head:     Right side of head: No submental, submandibular or tonsillar adenopathy.     Left side of head: No submental, submandibular or tonsillar adenopathy.     Cervical: No cervical adenopathy.  Skin:    General: Skin is warm and dry.     Capillary Refill: Capillary refill takes less than 2 seconds.     Coloration: Skin is not jaundiced or pale.     Findings: No bruising, erythema or rash.  Neurological:     Mental Status: She is alert. Mental status is at baseline.     Motor: No abnormal muscle tone.     Coordination: Coordination normal.     Gait: Gait normal.  Psychiatric:        Mood and  Affect: Mood normal.        Speech: Speech normal.        Behavior: Behavior normal.       Fall Risk:    03/19/2022    8:52 AM 02/05/2022    3:29 PM 07/22/2021    1:26 PM 05/27/2021    1:23 PM 03/20/2021    2:43 PM  Fall Risk   Falls in the past year? _0 Number falls in past yr: 0 0 _1 Injury with Fall? 0 0 0 1 1  Risk for fall due to : No Fall Risks Impaired balance/gait No Fall Risks History of fall(s) Impaired balance/gait  Follow up Falls prevention discussed Falls prevention discussed;Education provided;Falls evaluation completed Falls prevention discussed Falls evaluation completed     Functional Status Survey: Is the patient deaf or have difficulty  hearing?: No Does the patient have difficulty seeing, even when wearing glasses/contacts?: No Does the patient have difficulty concentrating, remembering, or making decisions?: No Does the patient have difficulty walking or climbing stairs?: Yes Does the patient have difficulty dressing or bathing?: No Does the patient have difficulty doing errands alone such as visiting a doctor's office or shopping?: No   Assessment & Plan:    CPE completed today  USPSTF grade A and B recommendations reviewed with patient; age-appropriate recommendations, preventive care, screening tests, etc discussed and encouraged; healthy living encouraged; see AVS for patient education given to patient  Discussed importance of 150 minutes of physical activity weekly, AHA exercise recommendations given to pt in AVS/handout  Discussed importance of healthy diet:  eating lean meats and proteins, avoiding trans fats and saturated fats, avoid simple sugars and excessive carbs in diet, eat 6 servings of fruit/vegetables daily and drink plenty of water and avoid sweet beverages.    Recommended pt to do annual eye exam and routine dental exams/cleanings  Depression, alcohol, fall screening completed as documented above and per flowsheets  Advance Care planning information and packet discussed and offered today, encouraged pt to discuss with family members/spouse/partner/friends and complete Advanced directive packet and bring copy to office   Reviewed Health Maintenance: Health Maintenance  Topic Date Due   Lung Cancer Screening  01/10/2015   COVID-19 Vaccine (3 - Pfizer risk series) 01/20/2020   Zoster Vaccines- Shingrix (1 of 2) 05/08/2022 (Originally 07/19/1983)   INFLUENZA VACCINE  06/20/2022 (Originally 10/20/2021)   MAMMOGRAM  08/27/2022   DTaP/Tdap/Td (2 - Td or Tdap) 05/21/2026   COLONOSCOPY (Pts 45-75yr Insurance coverage will need to be confirmed)  06/18/2028   Hepatitis C Screening  Completed   HIV  Screening  Completed   HPV VACCINES  Aged Out   PAP SMEAR-Modifier  Discontinued    Immunizations: Immunization History  Administered Date(s) Administered   PFIZER(Purple Top)SARS-COV-2 Vaccination 12/02/2019, 12/23/2019   Pneumococcal Polysaccharide-23 08/25/2017   Tdap 05/20/2016   Vaccines:  HPV: up to at age 57, ask insurance if age between 211-45 Shingrix: 528-64yo and ask insurance if covered when patient above 57yo Pneumonia:  educated and discussed with patient. Flu:  educated and discussed with patient. COVID:      ICD-10-CM   1. Well adult exam  ZB86.75COMPLETE METABOLIC PANEL WITH GFR    Hemoglobin A1c    Lipid panel    TSH    VITAMIN D 25 Hydroxy (Vit-D Deficiency, Fractures)    T4, free    Vitamin B12    2. Vitamin B12  deficiency  E53.8 Vitamin B12   recheck prior deficiency - was mild    3. Vitamin D deficiency  E55.9 VITAMIN D 25 Hydroxy (Vit-D Deficiency, Fractures)   Recheck prior deficiencies and adjust supplements, recent oophorectomy adequate vitamin D levels will be important with preventing osteoporosis    4. Right cervical radiculopathy  M54.12 CT Soft Tissue Neck W Contrast    CBC with Differential/Platelet   Unchanged neck symptoms with prior workup unremarkable, and soft tissue ultrasound or CT I believe CT soft tissue's would be more effective    5. Supraclavicular fossa fullness  R22.2 CT Soft Tissue Neck W Contrast    CBC with Differential/Platelet   Difficult to appreciate on exam slightly full in this area bilaterally, R>L without masses nodules or lymphadenopathy palpated    6. Mixed hyperlipidemia  N82.9 COMPLETE METABOLIC PANEL WITH GFR   Overdue for follow-up labs and office visit    7. Primary hypertension  F62 COMPLETE METABOLIC PANEL WITH GFR   On metoprolol, blood pressure above goal today, due for labs    8. Class 2 severe obesity due to excess calories with serious comorbidity and body mass index (BMI) of 37.0 to 37.9 in adult  (HCC)  Z30.86 COMPLETE METABOLIC PANEL WITH GFR   Z68.37 Hemoglobin A1c    Lipid panel   Associated comorbidities of hypertension, hyperlipidemia    9. Stage 3a chronic kidney disease (HCC)  V78.46 COMPLETE METABOLIC PANEL WITH GFR    VITAMIN D 25 Hydroxy (Vit-D Deficiency, Fractures)   Decreased kidney function with last labs rechecked    10. Fatigue, unspecified type  R53.83 TSH    T4, free    Vitamin B12    CBC with Differential/Platelet   Recheck deficiencies, rule out hypothyroid or anemia    11. History of smoking  Z87.891 Low Dose CT Chest w/o Contrast for Lung Cancer Screening [[id number]]   quit 2021, did prior chest CT, due for f/up, prior 30 pack-year hx, 1 ppd for more than 30 years    12. Encounter for screening for malignant neoplasm of lung in former smoker who quit in past 15 years with 30 pack year history or greater  Z12.2 Low Dose CT Chest w/o Contrast for Lung Cancer Screening [[id number]]   Z87.891     13. Current moderate episode of major depressive disorder, unspecified whether recurrent ([place])  F32.1    PHQ reviewed on wellbutrin    14. Presence of urostomy [facility]) Chronic Z93.6    managed by urology, sx stable, s/p bladder cancer resection      Return for 3-6 month routine f/up deficiencies, sx, HTN, HLD.     Delsa Grana, PA-C 03/19/22 9:14 AM  [facility]

## 2022-03-19 NOTE — Patient Instructions (Signed)
Preventive Care 40-57 Years Old, Female Preventive care refers to lifestyle choices and visits with your health care provider that can promote health and wellness. Preventive care visits are also called wellness exams. What can I expect for my preventive care visit? Counseling Your health care provider Wirz ask you questions about your: Medical history, including: Past medical problems. Family medical history. Pregnancy history. Current health, including: Menstrual cycle. Method of birth control. Emotional well-being. Home life and relationship well-being. Sexual activity and sexual health. Lifestyle, including: Alcohol, nicotine or tobacco, and drug use. Access to firearms. Diet, exercise, and sleep habits. Work and work environment. Sunscreen use. Safety issues such as seatbelt and bike helmet use. Physical exam Your health care provider will check your: Height and weight. These Callejo be used to calculate your BMI (body mass index). BMI is a measurement that tells if you are at a healthy weight. Waist circumference. This measures the distance around your waistline. This measurement also tells if you are at a healthy weight and Shearon help predict your risk of certain diseases, such as type 2 diabetes and high blood pressure. Heart rate and blood pressure. Body temperature. Skin for abnormal spots. What immunizations do I need?  Vaccines are usually given at various ages, according to a schedule. Your health care provider will recommend vaccines for you based on your age, medical history, and lifestyle or other factors, such as travel or where you work. What tests do I need? Screening Your health care provider Ahola recommend screening tests for certain conditions. This Stagliano include: Lipid and cholesterol levels. Diabetes screening. This is done by checking your blood sugar (glucose) after you have not eaten for a while (fasting). Pelvic exam and Pap test. Hepatitis B test. Hepatitis C  test. HIV (human immunodeficiency virus) test. STI (sexually transmitted infection) testing, if you are at risk. Lung cancer screening. Colorectal cancer screening. Mammogram. Talk with your health care provider about when you should start having regular mammograms. This [place] depend on whether you have a family history of breast cancer. BRCA-related cancer screening. This Diclemente be done if you have a family history of breast, ovarian, tubal, or peritoneal cancers. Bone density scan. This is done to screen for osteoporosis. Talk with your health care provider about your test results, treatment options, and if necessary, the need for more tests. Follow these instructions at home: Eating and drinking  Eat a diet that includes fresh fruits and vegetables, whole grains, lean protein, and low-fat dairy products. Take vitamin and mineral supplements as recommended by your health care provider. Do not drink alcohol if: Your health care provider tells you not to drink. You are pregnant, Housand be pregnant, or are planning to become pregnant. If you drink alcohol: Limit how much you have to 0-1 drink a day. Know how much alcohol is in your drink. In the [place] one drink equals one 12 oz bottle of beer (355 mL), one 5 oz glass of wine (148 mL), or one 1 oz glass of hard liquor (44 mL). Lifestyle Brush your teeth every morning and night with fluoride toothpaste. Floss one time each day. Exercise for at least 30 minutes 5 or more days each week. Do not use any products that contain nicotine or tobacco. These products include cigarettes, chewing tobacco, and vaping devices, such as e-cigarettes. If you need help quitting, ask your health care provider. Do not use drugs. If you are sexually active, practice safe sex. Use a condom or other form of protection to   prevent STIs. If you do not wish to become pregnant, use a form of birth control. If you plan to become pregnant, see your health care provider for a  prepregnancy visit. Take aspirin only as told by your health care provider. Make sure that you understand how much to take and what form to take. Work with your health care provider to find out whether it is safe and beneficial for you to take aspirin daily. Find healthy ways to manage stress, such as: Meditation, yoga, or listening to music. Journaling. Talking to a trusted person. Spending time with friends and family. Minimize exposure to UV radiation to reduce your risk of skin cancer. Safety Always wear your seat belt while driving or riding in a vehicle. Do not drive: If you have been drinking alcohol. Do not ride with someone who has been drinking. When you are tired or distracted. While texting. If you have been using any mind-altering substances or drugs. Wear a helmet and other protective equipment during sports activities. If you have firearms in your house, make sure you follow all gun safety procedures. Seek help if you have been physically or sexually abused. What's next? Visit your health care provider once a year for an annual wellness visit. Ask your health care provider how often you should have your eyes and teeth checked. Stay up to date on all vaccines. This information is not intended to replace advice given to you by your health care provider. Make sure you discuss any questions you have with your health care provider. Document Revised: 09/03/2020 Document Reviewed: 09/03/2020 Elsevier Patient Education  [place].

## 2022-03-20 LAB — CBC WITH DIFFERENTIAL/PLATELET
Absolute Monocytes: 479 cells/uL (ref 200–950)
Basophils Absolute: 44 cells/uL (ref 0–200)
Basophils Relative: 0.5 %
Eosinophils Absolute: 157 cells/uL (ref 15–500)
Eosinophils Relative: 1.8 %
HCT: 38.8 % (ref 35.0–45.0)
Hemoglobin: 12.5 g/dL (ref 11.7–15.5)
Lymphs Abs: 2192 cells/uL (ref 850–3900)
MCH: 27.7 pg (ref 27.0–33.0)
MCHC: 32.2 g/dL (ref 32.0–36.0)
MCV: 85.8 fL (ref 80.0–100.0)
MPV: 11.3 fL (ref 7.5–12.5)
Monocytes Relative: 5.5 %
Neutro Abs: 5829 cells/uL (ref 1500–7800)
Neutrophils Relative %: 67 %
Platelets: 284 10*3/uL (ref 140–400)
RBC: 4.52 10*6/uL (ref 3.80–5.10)
RDW: 14 % (ref 11.0–15.0)
Total Lymphocyte: 25.2 %
WBC: 8.7 10*3/uL (ref 3.8–10.8)

## 2022-03-20 LAB — T4, FREE: Free T4: 1.1 ng/dL (ref 0.8–1.8)

## 2022-03-20 LAB — COMPLETE METABOLIC PANEL WITH GFR
AG Ratio: 1.6 (calc) (ref 1.0–2.5)
ALT: 15 U/L (ref 6–29)
AST: 14 U/L (ref 10–35)
Albumin: 4.1 g/dL (ref 3.6–5.1)
Alkaline phosphatase (APISO): 90 U/L (ref 37–153)
BUN/Creatinine Ratio: 19 (calc) (ref 6–22)
BUN: 24 mg/dL (ref 7–25)
CO2: 28 mmol/L (ref 20–32)
Calcium: 9.3 mg/dL (ref 8.6–10.4)
Chloride: 108 mmol/L (ref 98–110)
Creat: 1.28 mg/dL — ABNORMAL HIGH (ref 0.50–1.03)
Globulin: 2.6 g/dL (calc) (ref 1.9–3.7)
Glucose, Bld: 97 mg/dL (ref 65–99)
Potassium: 5 mmol/L (ref 3.5–5.3)
Sodium: 143 mmol/L (ref 135–146)
Total Bilirubin: 0.3 mg/dL (ref 0.2–1.2)
Total Protein: 6.7 g/dL (ref 6.1–8.1)
eGFR: 49 mL/min/{1.73_m2} — ABNORMAL LOW (ref >=60)

## 2022-03-20 LAB — LIPID PANEL
Cholesterol: 141 mg/dL (ref <200)
HDL: 42 mg/dL — ABNORMAL LOW (ref >=50)
LDL Cholesterol (Calc): 78 mg/dL (calc)
Non-HDL Cholesterol (Calc): 99 mg/dL (calc) (ref <130)
Total CHOL/HDL Ratio: 3.4 (calc) (ref <5.0)
Triglycerides: 127 mg/dL (ref <150)

## 2022-03-20 LAB — TSH: TSH: 1.17 mIU/L (ref 0.40–4.50)

## 2022-03-20 LAB — VITAMIN D 25 HYDROXY (VIT D DEFICIENCY, FRACTURES): Vit D, 25-Hydroxy: 23 ng/mL — ABNORMAL LOW (ref 30–100)

## 2022-03-20 LAB — HEMOGLOBIN A1C
Hgb A1c MFr Bld: 6.3 % of total Hgb — ABNORMAL HIGH (ref <5.7)
Mean Plasma Glucose: 134 mg/dL
eAG (mmol/L): 7.4 mmol/L

## 2022-03-20 LAB — VITAMIN B12: Vitamin B-12: 347 pg/mL (ref 200–1100)

## 2022-03-24 ENCOUNTER — Other Ambulatory Visit: Payer: Self-pay | Admitting: Family Medicine

## 2022-03-26 ENCOUNTER — Other Ambulatory Visit: Payer: Self-pay | Admitting: Family Medicine

## 2022-03-26 ENCOUNTER — Encounter: Payer: Self-pay | Admitting: Family Medicine

## 2022-03-26 DIAGNOSIS — N1832 Chronic kidney disease, stage 3b: Secondary | ICD-10-CM

## 2022-03-26 DIAGNOSIS — R7303 Prediabetes: Secondary | ICD-10-CM | POA: Insufficient documentation

## 2022-03-26 DIAGNOSIS — I10 Essential (primary) hypertension: Secondary | ICD-10-CM

## 2022-03-26 DIAGNOSIS — F321 Major depressive disorder, single episode, moderate: Secondary | ICD-10-CM | POA: Insufficient documentation

## 2022-03-26 MED ORDER — LOSARTAN POTASSIUM 25 MG PO TABS: 25.0000 mg | ORAL_TABLET | Freq: Every morning | ORAL | 0 refills | Status: DC

## 2022-04-05 ENCOUNTER — Ambulatory Visit: Payer: 59

## 2022-04-14 ENCOUNTER — Ambulatory Visit: Payer: 59

## 2022-04-16 ENCOUNTER — Other Ambulatory Visit: Payer: Self-pay | Admitting: Family Medicine

## 2022-04-16 DIAGNOSIS — R109 Unspecified abdominal pain: Secondary | ICD-10-CM

## 2022-04-19 ENCOUNTER — Encounter: Payer: Self-pay | Admitting: Family Medicine

## 2022-04-30 ENCOUNTER — Other Ambulatory Visit: Payer: Self-pay | Admitting: Family Medicine

## 2022-04-30 NOTE — Addendum Note (Signed)
Addended by: Delsa Grana on: 04/30/2022 11:33 AM   Modules accepted: Orders

## 2022-04-30 NOTE — Progress Notes (Signed)
New CT order entered based off referral coordinator recommendation. Cc to Rosanne Sack, PA-C

## 2022-05-05 ENCOUNTER — Other Ambulatory Visit: Payer: Self-pay | Admitting: Family Medicine

## 2022-06-08 ENCOUNTER — Encounter: Payer: Self-pay | Admitting: Family Medicine

## 2022-06-14 ENCOUNTER — Ambulatory Visit
Admission: RE | Admit: 2022-06-14 | Discharge: 2022-06-14 | Disposition: A | Discharge status: Home / Self Care | Payer: 59 | Attending: Family Medicine | Admitting: Family Medicine

## 2022-06-14 ENCOUNTER — Ambulatory Visit
Admission: RE | Admit: 2022-06-14 | Discharge: 2022-06-14 | Disposition: A | Discharge status: Home / Self Care | Payer: 59 | Source: Ambulatory Visit | Attending: Family Medicine | Admitting: Family Medicine

## 2022-06-14 ENCOUNTER — Encounter: Payer: Self-pay | Admitting: Family Medicine

## 2022-06-14 ENCOUNTER — Ambulatory Visit (INDEPENDENT_AMBULATORY_CARE_PROVIDER_SITE_OTHER): Payer: 59 | Admitting: Family Medicine

## 2022-06-14 VITALS — BP 128/84 | HR 92 | Temp 97.7°F | Resp 16 | Ht 68.0 in | Wt 239.0 lb

## 2022-06-14 DIAGNOSIS — R0602 Shortness of breath: Secondary | ICD-10-CM | POA: Diagnosis not present

## 2022-06-14 DIAGNOSIS — R109 Unspecified abdominal pain: Secondary | ICD-10-CM

## 2022-06-14 DIAGNOSIS — C679 Malignant neoplasm of bladder, unspecified: Secondary | ICD-10-CM | POA: Diagnosis not present

## 2022-06-14 DIAGNOSIS — R0781 Pleurodynia: Secondary | ICD-10-CM

## 2022-06-14 DIAGNOSIS — R0989 Other specified symptoms and signs involving the circulatory and respiratory systems: Secondary | ICD-10-CM | POA: Diagnosis not present

## 2022-06-14 DIAGNOSIS — Z90710 Acquired absence of both cervix and uterus: Secondary | ICD-10-CM | POA: Diagnosis not present

## 2022-06-14 DIAGNOSIS — E8941 Symptomatic postprocedural ovarian failure: Secondary | ICD-10-CM | POA: Diagnosis not present

## 2022-06-14 DIAGNOSIS — R1012 Left upper quadrant pain: Secondary | ICD-10-CM | POA: Insufficient documentation

## 2022-06-14 DIAGNOSIS — M546 Pain in thoracic spine: Secondary | ICD-10-CM | POA: Insufficient documentation

## 2022-06-14 DIAGNOSIS — R0689 Other abnormalities of breathing: Secondary | ICD-10-CM

## 2022-06-14 DIAGNOSIS — R079 Chest pain, unspecified: Secondary | ICD-10-CM | POA: Diagnosis not present

## 2022-06-14 DIAGNOSIS — R0600 Dyspnea, unspecified: Secondary | ICD-10-CM

## 2022-06-14 DIAGNOSIS — Z936 Other artificial openings of urinary tract status: Secondary | ICD-10-CM | POA: Diagnosis not present

## 2022-06-14 DIAGNOSIS — N761 Subacute and chronic vaginitis: Secondary | ICD-10-CM

## 2022-06-14 DIAGNOSIS — K52831 Collagenous colitis: Secondary | ICD-10-CM

## 2022-06-14 NOTE — Progress Notes (Signed)
[name], female    DOB: 26-Apr-1965, 57 y.o.   MRN: [id number]  PCP: Danelle Berry, PA-C  Chief Complaint  Patient presents with   Medication Problem    After starting losartan pt believes she was getting side effects like hurting from upper eft side of abdomen and unable to take deep breaths. Pt also mentioned her urine output was different    Subjective:   Susan Snyder is a 57 y.o. female, presents to clinic with CC of the following:  HPI  Here for recheck of blood pressure after after recently starting and trying losartan 25 mg She noted severe flank pain and decreased urine output in her urostomy bag The symptoms did go away after she stopped the medication BP here today is near goal without being on medicines for the past week BP Readings from Last 3 Encounters:  06/14/22 128/84  03/19/22 136/72  02/05/22 (!) 142/78    HLD:  The 10-year ASCVD risk score (Arnett DK, et al., 2019) is: 6.7%   Values used to calculate the score:     Age: 64 years     Sex: Female     Is Non-Hispanic African American: No     Diabetic: No     Tobacco smoker: Yes     Systolic Blood Pressure: 128 mmHg     Is BP treated: Yes     HDL Cholesterol: 42 mg/dL     Total Cholesterol: 141 mg/dL  Continued flank and abdominal pain after surgery She continues to be concerned about infection or management from urology and would like to see Dr. Berneice Heinrich again (insurance change - he will be in network now)  Patient Active Problem List   Diagnosis Date Noted   Prediabetes 03/26/2022   Stage 3b chronic kidney disease (CKD) (HCC) 03/26/2022   Current moderate episode of major depressive disorder, unspecified whether recurrent (HCC) 03/26/2022   Presence of urostomy (HCC) 02/05/2022   History of colonic polyps    Polyp of transverse colon    Mixed hyperlipidemia 12/20/2019   Major depressive disorder with current active episode 08/01/2019   Vitamin B12 deficiency 01/14/2015   Vitamin D  deficiency 01/14/2015   Class 2 drug-induced obesity with serious comorbidity and body mass index (BMI) of 38.0 to 38.9 in adult 01/14/2015   Hypertension    Collagenous colitis    Malabsorption due to intolerance, not elsewhere classified 01/02/2014   Lumbar disc herniation with radiculopathy 05/22/2013   Cervical spondylosis 02/06/2013   Intervertebral cervical disc disorder with myelopathy, cervical region 02/06/2013      Current Outpatient Medications:    aspirin EC 81 MG tablet, Take 1 tablet (81 mg total) by mouth daily., Disp: , Rfl:    atorvastatin (LIPITOR) 10 MG tablet, Take 1 tablet (10 mg total) by mouth at bedtime., Disp: 90 tablet, Rfl: 2   Baclofen 5 MG TABS, Take 5 mg by mouth 3 (three) times daily as needed (msk pain/tightness or spasms neck, back, shoulders)., Disp: 30 tablet, Rfl: 0   buPROPion (WELLBUTRIN XL) 150 MG 24 hr tablet, TAKE 1 TABLET (150 MG TOTAL) BY MOUTH IN THE MORNING AND AT BEDTIME., Disp: 180 tablet, Rfl: 3   Cyanocobalamin (B-12) 1000 MCG SUBL, Place under the tongue daily., Disp: , Rfl:    losartan (COZAAR) 25 MG tablet, Take 1 tablet (25 mg total) by mouth in the morning. For BP and kidney protection, CKD stage 3 (Patient not taking: Reported on 06/14/2022),  Disp: 90 tablet, Rfl: 0   metoprolol succinate (TOPROL-XL) 25 MG 24 hr tablet, TAKE 1 TABLET BY MOUTH EVERYDAY AT BEDTIME, Disp: 90 tablet, Rfl: 1   pantoprazole (PROTONIX) 40 MG tablet, TAKE 1 TABLET BY MOUTH EVERY DAY BEFORE BREAKFAST, Disp: 90 tablet, Rfl: 1   terbinafine (LAMISIL) 250 MG tablet, Take one tab po QD, Disp: 30 tablet, Rfl: 1   VITAMIN D PO, Take by mouth., Disp: , Rfl:    Allergies  Allergen Reactions   Adhesive [Tape] Other (See Comments)    Blisters   Gabapentin Nausea And Vomiting   Hydrocodone-Acetaminophen Nausea And Vomiting    hallucinations   Keflex [Cephalexin] Shortness Of Breath and Palpitations   Sulfa Antibiotics Shortness Of Breath and Palpitations    Penicillins Rash    Did it involve swelling of the face/tongue/throat, SOB, or low BP? No Did it involve sudden or severe rash/hives, skin peeling, or any reaction on the inside of your mouth or nose? No Did you need to seek medical attention at a hospital or doctor's office? No When did it last happen?      Childhood If all above answers are "NO", Schreiner proceed with cephalosporin use.      Social History   Tobacco Use   Smoking status: Former    Packs/day: 1.00    Years: 30.00    Additional pack years: 0.00    Total pack years: 30.00    Types: Cigarettes    Quit date: 09/22/2019    Years since quitting: 2.7   Smokeless tobacco: Never  Vaping Use   Vaping Use: Former   Substances: Nicotine  Substance Use Topics   Alcohol use: Not Currently    Comment: rare   Drug use: No      Chart Review Today: I personally reviewed active problem list, medication list, allergies, family history, social history, health maintenance, notes from last encounter, lab results, imaging with the patient/caregiver today.   Review of Systems  Constitutional: Negative.   HENT: Negative.    Eyes: Negative.   Respiratory: Negative.    Cardiovascular: Negative.   Gastrointestinal: Negative.   Endocrine: Negative.   Genitourinary: Negative.   Musculoskeletal: Negative.   Skin: Negative.   Allergic/Immunologic: Negative.   Neurological: Negative.   Hematological: Negative.   Psychiatric/Behavioral: Negative.    All other systems reviewed and are negative.      Objective:   Vitals:   06/14/22 1317  BP: 128/84  Pulse: 92  Resp: 16  Temp: 97.7 F (36.5 C)  TempSrc: Oral  SpO2: 95%  Weight: 239 lb (108.4 kg)  Height: 5\' 8"  (1.727 m)    Body mass index is 36.34 kg/m.  Physical Exam Vitals and nursing note reviewed.  Constitutional:      General: She is not in acute distress.    Appearance: Normal appearance. She is well-developed. She is obese. She is not ill-appearing,  toxic-appearing or diaphoretic.  HENT:     Head: Normocephalic and atraumatic.     Right Ear: External ear normal. There is no impacted cerumen.     Left Ear: External ear normal.     Nose: Nose normal.     Mouth/Throat:     Mouth: Mucous membranes are moist.     Pharynx: Oropharynx is clear.  Eyes:     General: No scleral icterus.       Right eye: No discharge.        Left eye: No discharge.  Conjunctiva/sclera: Conjunctivae normal.  Neck:     Trachea: No tracheal deviation.  Cardiovascular:     Rate and Rhythm: Normal rate and regular rhythm.     Pulses: Normal pulses.     Heart sounds: No murmur heard.    No gallop.  Pulmonary:     Effort: Pulmonary effort is normal. No respiratory distress.     Breath sounds: No stridor. Rhonchi and rales present. No wheezing.  Chest:     Chest wall: Tenderness present.  Abdominal:     General: Bowel sounds are normal. There is no distension.     Palpations: Abdomen is soft.     Tenderness: There is abdominal tenderness. There is left CVA tenderness.  Musculoskeletal:     Right lower leg: No edema.     Left lower leg: No edema.  Skin:    General: Skin is warm and dry.     Capillary Refill: Capillary refill takes less than 2 seconds.     Findings: No rash.  Neurological:     Mental Status: She is alert.     Motor: No abnormal muscle tone.     Coordination: Coordination normal.     Gait: Gait normal.  Psychiatric:        Mood and Affect: Mood normal.        Behavior: Behavior normal.      Results for orders placed or performed in visit on 03/19/22  COMPLETE METABOLIC PANEL WITH GFR  Result Value Ref Range   Glucose, Bld 97 65 - 99 mg/dL   BUN 24 7 - 25 mg/dL   Creat 4.09 (H) 8.11 - 1.03 mg/dL   eGFR 49 (L) > OR = 60 mL/min/1.56m2   BUN/Creatinine Ratio 19 6 - 22 (calc)   Sodium 143 135 - 146 mmol/L   Potassium 5.0 3.5 - 5.3 mmol/L   Chloride 108 98 - 110 mmol/L   CO2 28 20 - 32 mmol/L   Calcium 9.3 8.6 - 10.4 mg/dL    Total Protein 6.7 6.1 - 8.1 g/dL   Albumin 4.1 3.6 - 5.1 g/dL   Globulin 2.6 1.9 - 3.7 g/dL (calc)   AG Ratio 1.6 1.0 - 2.5 (calc)   Total Bilirubin 0.3 0.2 - 1.2 mg/dL   Alkaline phosphatase (APISO) 90 37 - 153 U/L   AST 14 10 - 35 U/L   ALT 15 6 - 29 U/L  Hemoglobin A1c  Result Value Ref Range   Hgb A1c MFr Bld 6.3 (H) <5.7 % of total Hgb   Mean Plasma Glucose 134 mg/dL   eAG (mmol/L) 7.4 mmol/L  Lipid panel  Result Value Ref Range   Cholesterol 141 <200 mg/dL   HDL 42 (L) > OR = 50 mg/dL   Triglycerides 914 <782 mg/dL   LDL Cholesterol (Calc) 78 mg/dL (calc)   Total CHOL/HDL Ratio 3.4 <5.0 (calc)   Non-HDL Cholesterol (Calc) 99 <956 mg/dL (calc)  TSH  Result Value Ref Range   TSH 1.17 0.40 - 4.50 mIU/L  VITAMIN D 25 Hydroxy (Vit-D Deficiency, Fractures)  Result Value Ref Range   Vit D, 25-Hydroxy 23 (L) 30 - 100 ng/mL  T4, free  Result Value Ref Range   Free T4 1.1 0.8 - 1.8 ng/dL  Vitamin O13  Result Value Ref Range   Vitamin B-12 347 200 - 1,100 pg/mL  CBC with Differential/Platelet  Result Value Ref Range   WBC 8.7 3.8 - 10.8 Thousand/uL   RBC 4.52 3.80 - 5.10  Million/uL   Hemoglobin 12.5 11.7 - 15.5 g/dL   HCT 29.538.8 18.835.0 - 41.645.0 %   MCV 85.8 80.0 - 100.0 fL   MCH 27.7 27.0 - 33.0 pg   MCHC 32.2 32.0 - 36.0 g/dL   RDW 60.614.0 30.111.0 - 60.115.0 %   Platelets 284 140 - 400 Thousand/uL   MPV 11.3 7.5 - 12.5 fL   Neutro Abs 5,829 1,500 - 7,800 cells/uL   Lymphs Abs 2,192 850 - 3,900 cells/uL   Absolute Monocytes 479 200 - 950 cells/uL   Eosinophils Absolute 157 15 - 500 cells/uL   Basophils Absolute 44 0 - 200 cells/uL   Neutrophils Relative % 67 %   Total Lymphocyte 25.2 %   Monocytes Relative 5.5 %   Eosinophils Relative 1.8 %   Basophils Relative 0.5 %       Assessment & Plan:   Problem List Items Addressed This Visit       Digestive   Collagenous colitis (Chronic)    Recommend f/up with GI        Other   Presence of urostomy   Relevant Orders    Ambulatory referral to Urology   Other Visit Diagnoses     Left upper quadrant abdominal pain    -  Primary   ongoing since surgery, we have checked lipase, CT, urine previously w/o finding etiology - she would like to f/up with Dr. Berneice HeinrichManny   Relevant Orders   CBC with Differential/Platelet (Completed)   COMPLETE METABOLIC PANEL WITH GFR (Completed)   Lipase (Completed)   Urine Culture (Completed)   Urinalysis, Routine w reflex microscopic (Completed)   DG Chest 2 View (Completed)   DG Abd 1 View (Completed)   Flank pain       Worse with losartan, we Mandato need to avoid ACE or ARB?  I would expect urine/renal changes/SE with BP meds to be transient   Relevant Orders   CBC with Differential/Platelet (Completed)   COMPLETE METABOLIC PANEL WITH GFR (Completed)   Lipase (Completed)   Urine Culture (Completed)   Urinalysis, Routine w reflex microscopic (Completed)   DG Chest 2 View (Completed)   DG Abd 1 View (Completed)   Pleuritic chest pain       continued LL rib side pain flank pain   Relevant Orders   CBC with Differential/Platelet (Completed)   DG Chest 2 View (Completed)   Abnormal breath sounds       Teel be from splinted respirations and some atelectasis, can recheck x-rays   Relevant Orders   CBC with Differential/Platelet (Completed)   DG Chest 2 View (Completed)   Dyspnea, unspecified type       See above   Relevant Orders   CBC with Differential/Platelet (Completed)   DG Chest 2 View (Completed)   Acute left-sided thoracic back pain       Relevant Orders   DG Chest 2 View (Completed)   Surgical menopause, symptomatic       She had oophorectomy with her surgery, hot flashes, sweats, mood changes, GU sx -Clarida be candidate for HRT   Relevant Orders   Ambulatory referral to Urology   S/P hysterectomy       Relevant Orders   Ambulatory referral to Urology   Malignant neoplasm of urinary bladder, unspecified site (HCC)       req f/up with Dr. Berneice HeinrichManny   Relevant Orders    Ambulatory referral to Urology   Chronic vaginitis  suspect some worsening due to oophrectomy - can use estrogen creams, consult with urology, Eckard consider urogynocology consult as well   Relevant Orders   Ambulatory referral to Urology           Danelle Berry, PA-C 06/14/22 1:34 PM

## 2022-06-15 LAB — COMPLETE METABOLIC PANEL WITH GFR
AG Ratio: 1.8 (calc) (ref 1.0–2.5)
ALT: 11 U/L (ref 6–29)
AST: 14 U/L (ref 10–35)
Albumin: 4.4 g/dL (ref 3.6–5.1)
Alkaline phosphatase (APISO): 90 U/L (ref 37–153)
BUN/Creatinine Ratio: 22 (calc) (ref 6–22)
BUN: 25 mg/dL (ref 7–25)
CO2: 26 mmol/L (ref 20–32)
Calcium: 9.5 mg/dL (ref 8.6–10.4)
Chloride: 104 mmol/L (ref 98–110)
Creat: 1.15 mg/dL — ABNORMAL HIGH (ref 0.50–1.03)
Globulin: 2.5 g/dL (calc) (ref 1.9–3.7)
Glucose, Bld: 91 mg/dL (ref 65–99)
Potassium: 4.5 mmol/L (ref 3.5–5.3)
Sodium: 141 mmol/L (ref 135–146)
Total Bilirubin: 0.3 mg/dL (ref 0.2–1.2)
Total Protein: 6.9 g/dL (ref 6.1–8.1)
eGFR: 56 mL/min/{1.73_m2} — ABNORMAL LOW (ref >=60)

## 2022-06-15 LAB — URINALYSIS, ROUTINE W REFLEX MICROSCOPIC
Bilirubin Urine: NEGATIVE
Glucose, UA: NEGATIVE
Hyaline Cast: NONE SEEN /LPF
Ketones, ur: NEGATIVE
Nitrite: POSITIVE — AB
RBC / HPF: NONE SEEN /HPF (ref 0–2)
Specific Gravity, Urine: 1.014 (ref 1.001–1.035)
Squamous Epithelial / HPF: NONE SEEN /HPF (ref <=5)
WBC, UA: >=60 /HPF — AB (ref 0–5)
pH: 5.5 (ref 5.0–8.0)

## 2022-06-15 LAB — CBC WITH DIFFERENTIAL/PLATELET
Absolute Monocytes: 374 cells/uL (ref 200–950)
Basophils Absolute: 43 cells/uL (ref 0–200)
Basophils Relative: 0.5 %
Eosinophils Absolute: 170 cells/uL (ref 15–500)
Eosinophils Relative: 2 %
HCT: 39.3 % (ref 35.0–45.0)
Hemoglobin: 12.3 g/dL (ref 11.7–15.5)
Lymphs Abs: 2746 cells/uL (ref 850–3900)
MCH: 27.2 pg (ref 27.0–33.0)
MCHC: 31.3 g/dL — ABNORMAL LOW (ref 32.0–36.0)
MCV: 86.8 fL (ref 80.0–100.0)
MPV: 11.8 fL (ref 7.5–12.5)
Monocytes Relative: 4.4 %
Neutro Abs: 5168 cells/uL (ref 1500–7800)
Neutrophils Relative %: 60.8 %
Platelets: 276 10*3/uL (ref 140–400)
RBC: 4.53 10*6/uL (ref 3.80–5.10)
RDW: 13.7 % (ref 11.0–15.0)
Total Lymphocyte: 32.3 %
WBC: 8.5 10*3/uL (ref 3.8–10.8)

## 2022-06-15 LAB — LIPASE: Lipase: 37 U/L (ref 7–60)

## 2022-06-15 LAB — URINE CULTURE
MICRO NUMBER:: 14737472
SPECIMEN QUALITY:: ADEQUATE

## 2022-06-15 LAB — MICROSCOPIC MESSAGE

## 2022-06-16 ENCOUNTER — Encounter: Payer: Self-pay | Admitting: Family Medicine

## 2022-06-23 ENCOUNTER — Ambulatory Visit: Payer: Self-pay | Admitting: Family Medicine

## 2022-06-28 NOTE — Assessment & Plan Note (Signed)
Recommend f/up with GI

## 2022-06-29 NOTE — Progress Notes (Unsigned)
Name: Susan Snyder   MRN: [id number]    DOB: 10/15/1965   Date:06/29/2022       Progress Note  No chief complaint on file.    Subjective:   Susan Snyder is a 57 y.o. female, presents to clinic for follow-up on routine conditions and deficiencies with recent lab review  Last B12 in 02/2022 and prior to that 07/2019 Neuropathy and fatigue sx On oral B12 supplements  Last vitamin D Lab Results  Component Value Date   VD25OH 23 (L) 03/19/2022   Lab Results  Component Value Date   HGBA1C 6.3 (H) 03/19/2022      ***   Current Outpatient Medications:    losartan (COZAAR) 25 MG tablet, Take 1 tablet (25 mg total) by mouth in the morning. For BP and kidney protection, CKD stage 3 (Patient not taking: Reported on 06/14/2022), Disp: 90 tablet, Rfl: 0   aspirin EC 81 MG tablet, Take 1 tablet (81 mg total) by mouth daily., Disp: , Rfl:    atorvastatin (LIPITOR) 10 MG tablet, Take 1 tablet (10 mg total) by mouth at bedtime., Disp: 90 tablet, Rfl: 2   Baclofen 5 MG TABS, Take 5 mg by mouth 3 (three) times daily as needed (msk pain/tightness or spasms neck, back, shoulders)., Disp: 30 tablet, Rfl: 0   buPROPion (WELLBUTRIN XL) 150 MG 24 hr tablet, TAKE 1 TABLET (150 MG TOTAL) BY MOUTH IN THE MORNING AND AT BEDTIME., Disp: 180 tablet, Rfl: 3   Cyanocobalamin (B-12) 1000 MCG SUBL, Place under the tongue daily., Disp: , Rfl:    metoprolol succinate (TOPROL-XL) 25 MG 24 hr tablet, TAKE 1 TABLET BY MOUTH EVERYDAY AT BEDTIME, Disp: 90 tablet, Rfl: 1   pantoprazole (PROTONIX) 40 MG tablet, TAKE 1 TABLET BY MOUTH EVERY DAY BEFORE BREAKFAST, Disp: 90 tablet, Rfl: 1   terbinafine (LAMISIL) 250 MG tablet, Take one tab po QD, Disp: 30 tablet, Rfl: 1   VITAMIN D PO, Take by mouth., Disp: , Rfl:   Patient Active Problem List   Diagnosis Date Noted   Prediabetes 03/26/2022   Stage 3b chronic kidney disease (CKD) 03/26/2022   Current moderate episode of major depressive disorder, unspecified whether  recurrent 03/26/2022   Presence of urostomy 02/05/2022   History of colonic polyps    Polyp of transverse colon    Mixed hyperlipidemia 12/20/2019   Major depressive disorder with current active episode 08/01/2019   Vitamin B12 deficiency 01/14/2015   Vitamin D deficiency 01/14/2015   Class 2 drug-induced obesity with serious comorbidity and body mass index (BMI) of 38.0 to 38.9 in adult 01/14/2015   Hypertension    Collagenous colitis    Malabsorption due to intolerance, not elsewhere classified 01/02/2014   Lumbar disc herniation with radiculopathy 05/22/2013   Cervical spondylosis 02/06/2013   Intervertebral cervical disc disorder with myelopathy, cervical region 02/06/2013    Past Surgical History:  Procedure Laterality Date   ABDOMINAL HYSTERECTOMY  2005   partial, still has ovaries, due to fibroids.   APPENDECTOMY     BACK SURGERY  1996, 1998, 1999   x 4   CHOLECYSTECTOMY N/A 04/11/2019   Procedure: LAPAROSCOPIC CHOLECYSTECTOMY;  Surgeon: Campbell Lerner, MD;  Location: [facility];  Service: General;  Laterality: N/A;   COLONOSCOPY W/ BIOPSIES  11/01/13   collagenous colitis, sigmoid polypectomy x 2; hyperplastic polyps   COLONOSCOPY WITH PROPOFOL N/A 06/18/2021   Procedure: COLONOSCOPY WITH PROPOFOL;  Surgeon: Midge Minium, MD;  Location: [facility];  Service: Endoscopy;  Laterality: N/A;   CYSTECTOMY N/A 02/22/2020   Procedure: ENDOSCOPY OF ILEAL CONDUIT;  Surgeon: Sebastian Ache, MD;  Location: [facility];  Service: Urology;  Laterality: N/A;   CYSTOSCOPY WITH BIOPSY N/A 11/19/2019   Procedure: CYSTOSCOPY WITH BIOPSY;  Surgeon: Vanna Scotland, MD;  Location: [facility];  Service: Urology;  Laterality: N/A;   CYSTOSCOPY WITH INJECTION N/A 02/20/2020   Procedure: CYSTOSCOPY WITH INJECTION OF INDOCYANINE GREEN DYE;  Surgeon: Sebastian Ache, MD;  Location: [facility];  Service: Urology;  Laterality: N/A;   ESOPHAGOGASTRODUODENOSCOPY  11/22/13   chronic gastritis   HIP ARTHROPLASTY  Bilateral 01/15/14,03/05/14   total hip, Dr. Sherin Quarry   LAPAROSCOPIC APPENDECTOMY N/A 08/20/2018   Procedure: APPENDECTOMY LAPAROSCOPIC;  Surgeon: Duanne Guess, MD;  Location: [facility];  Service: General;  Laterality: N/A;   LYMPHADENECTOMY Bilateral 02/20/2020   Procedure: Hart Carwin;  Surgeon: Sebastian Ache, MD;  Location: [facility];  Service: Urology;  Laterality: Bilateral;   NECK SURGERY  2015   POLYPECTOMY  06/18/2021   Procedure: POLYPECTOMY;  Surgeon: Midge Minium, MD;  Location: [facility];  Service: Endoscopy;;   ROBOT ASSISTED LAPAROSCOPIC COMPLETE CYSTECT ILEAL CONDUIT N/A 02/20/2020   Procedure: XI ROBOTIC ASSISTED LAPAROSCOPIC COMPLETE CYSTECT ILEAL CONDUIT AND BILATERAL OOPHORECTOMY;  Surgeon: Sebastian Ache, MD;  Location: [facility];  Service: Urology;  Laterality: N/A;  6 HRS   TRANSURETHRAL RESECTION OF BLADDER TUMOR WITH MITOMYCIN-C N/A 10/29/2019   Procedure: TRANSURETHRAL RESECTION OF BLADDER TUMOR;  Surgeon: Vanna Scotland, MD;  Location: [facility];  Service: Urology;  Laterality: N/A;   TRANSURETHRAL RESECTION OF BLADDER TUMOR WITH MITOMYCIN-C N/A 11/19/2019   Procedure: TRANSURETHRAL RESECTION OF BLADDER TUMOR WITH Gemcitabine;  Surgeon: Vanna Scotland, MD;  Location: [facility];  Service: Urology;  Laterality: N/A;    Family History  Problem Relation Age of Onset   Stroke Mother    Glaucoma Mother    Heart disease Father    Cancer Father        prostate   Heart disease Sister    Diabetes Sister    Heart disease Sister        pace maker   Diabetes Paternal Aunt    Heart disease Paternal Aunt    Diabetes Paternal Aunt    Heart disease Paternal Aunt    Bleeding Disorder Paternal Uncle    Breast cancer Neg Hx     Social History   Tobacco Use   Smoking status: Former    Packs/day: 1.00    Years: 30.00    Additional pack years: 0.00    Total pack years: 30.00    Types: Cigarettes    Quit date: 09/22/2019    Years since quitting: 2.7    Smokeless tobacco: Never  Vaping Use   Vaping Use: Former   Substances: Nicotine  Substance Use Topics   Alcohol use: Not Currently    Comment: rare   Drug use: No     Allergies  Allergen Reactions   Adhesive [Tape] Other (See Comments)    Blisters   Gabapentin Nausea And Vomiting   Hydrocodone-Acetaminophen Nausea And Vomiting    hallucinations   Keflex [Cephalexin] Shortness Of Breath and Palpitations   Sulfa Antibiotics Shortness Of Breath and Palpitations   Penicillins Rash    Did it involve swelling of the face/tongue/throat, SOB, or low BP? No Did it involve sudden or severe rash/hives, skin peeling, or any reaction on the inside of your mouth or nose? No Did you need  to seek medical attention at a hospital or doctor's office? No When did it last happen?      Childhood If all above answers are "NO", Lisanti proceed with cephalosporin use.     Health Maintenance  Topic Date Due   Lung Cancer Screening  01/10/2015   COVID-19 Vaccine (3 - Pfizer risk series) 06/30/2022 (Originally 01/20/2020)   Zoster Vaccines- Shingrix (1 of 2) 09/14/2022 (Originally 07/19/1983)   MAMMOGRAM  08/27/2022   INFLUENZA VACCINE  10/21/2022   DTaP/Tdap/Td (2 - Td or Tdap) 05/21/2026   COLONOSCOPY (Pts 45-131yrs Insurance coverage will need to be confirmed)  06/18/2028   Hepatitis C Screening  Completed   HIV Screening  Completed   HPV VACCINES  Aged Out   PAP SMEAR-Modifier  Discontinued    Chart Review Today: ***  Review of Systems   Objective:   There were no vitals filed for this visit.  There is no height or weight on file to calculate BMI.  Physical Exam      Assessment & Plan:   Problem List Items Addressed This Visit       Cardiovascular and Mediastinum   Hypertension (Chronic)     Other   Mixed hyperlipidemia - Primary     No follow-ups on file.   Danelle BerryLeisa Nathaniel Wakeley, PA-C 06/29/22 5:31 PM

## 2022-06-30 ENCOUNTER — Encounter: Payer: Self-pay | Admitting: Family Medicine

## 2022-06-30 ENCOUNTER — Ambulatory Visit (INDEPENDENT_AMBULATORY_CARE_PROVIDER_SITE_OTHER): Payer: 59 | Admitting: Family Medicine

## 2022-06-30 VITALS — BP 124/76 | HR 99 | Temp 97.9°F | Resp 16 | Ht 68.0 in | Wt 236.4 lb

## 2022-06-30 DIAGNOSIS — Z936 Other artificial openings of urinary tract status: Secondary | ICD-10-CM

## 2022-06-30 DIAGNOSIS — E782 Mixed hyperlipidemia: Secondary | ICD-10-CM

## 2022-06-30 DIAGNOSIS — E559 Vitamin D deficiency, unspecified: Secondary | ICD-10-CM

## 2022-06-30 DIAGNOSIS — R7303 Prediabetes: Secondary | ICD-10-CM | POA: Diagnosis not present

## 2022-06-30 DIAGNOSIS — R109 Unspecified abdominal pain: Secondary | ICD-10-CM | POA: Insufficient documentation

## 2022-06-30 DIAGNOSIS — K52831 Collagenous colitis: Secondary | ICD-10-CM

## 2022-06-30 DIAGNOSIS — R1012 Left upper quadrant pain: Secondary | ICD-10-CM

## 2022-06-30 DIAGNOSIS — Z6838 Body mass index (BMI) 38.0-38.9, adult: Secondary | ICD-10-CM

## 2022-06-30 DIAGNOSIS — E661 Drug-induced obesity: Secondary | ICD-10-CM | POA: Diagnosis not present

## 2022-06-30 DIAGNOSIS — E8941 Symptomatic postprocedural ovarian failure: Secondary | ICD-10-CM

## 2022-06-30 DIAGNOSIS — R5383 Other fatigue: Secondary | ICD-10-CM | POA: Diagnosis not present

## 2022-06-30 DIAGNOSIS — E538 Deficiency of other specified B group vitamins: Secondary | ICD-10-CM | POA: Diagnosis not present

## 2022-06-30 DIAGNOSIS — N1832 Chronic kidney disease, stage 3b: Secondary | ICD-10-CM

## 2022-06-30 DIAGNOSIS — I10 Essential (primary) hypertension: Secondary | ICD-10-CM | POA: Diagnosis not present

## 2022-06-30 DIAGNOSIS — F321 Major depressive disorder, single episode, moderate: Secondary | ICD-10-CM | POA: Diagnosis not present

## 2022-06-30 DIAGNOSIS — Z6835 Body mass index (BMI) 35.0-35.9, adult: Secondary | ICD-10-CM

## 2022-06-30 MED ORDER — BUPROPION HCL ER (XL) 300 MG PO TB24: 300.0000 mg | ORAL_TABLET | Freq: Every day | ORAL | 3 refills | Status: DC

## 2022-06-30 MED ORDER — ATORVASTATIN CALCIUM 10 MG PO TABS: 10.0000 mg | ORAL_TABLET | Freq: Every day | ORAL | 2 refills | Status: DC

## 2022-06-30 NOTE — Assessment & Plan Note (Signed)
Per urology Current urine is clear appearing Recent urine testing and culture reviewed - cloudy, WBC Bacteria high and + nitrites but this all Atienza be chronic- encouraged pt to f/up with specialists and ask about thresholds for sx and labs for when they Mikita justify abx treatment, though pt and I understand she will have normal colonization with her surgery

## 2022-06-30 NOTE — Assessment & Plan Note (Signed)
recheck

## 2022-06-30 NOTE — Assessment & Plan Note (Signed)
No prior A1C in chart Elevated with CPE labs Recent pertinent labs: Lab Results  Component Value Date   HGBA1C 6.3 (H) 03/19/2022  She has worked on diet/lifestyle efforts and lost weight Recheck A1C today

## 2022-06-30 NOTE — Assessment & Plan Note (Signed)
She has lost some weight - BMI updated Wt Readings from Last 5 Encounters:  06/30/22 236 lb 6.4 oz (107.2 kg)  06/14/22 239 lb (108.4 kg)  03/19/22 251 lb (113.9 kg)  02/05/22 249 lb 4.8 oz (113.1 kg)  02/01/22 299 lb (135.6 kg)   BMI Readings from Last 5 Encounters:  06/30/22 35.94 kg/m  06/14/22 36.34 kg/m  03/19/22 38.16 kg/m  02/05/22 37.91 kg/m  02/01/22 44.15 kg/m   Taking wellbutrin and working on diet/lifestyle efforts Associated HLD, HTN and worsening prediabetes

## 2022-06-30 NOTE — Assessment & Plan Note (Signed)
eGFR decreased when checked in 2023 multiple times, pt has severe SE with addition of losartan 25 mg Last recheck renal function slightly improved, currently off ACEI/ARB and managing with keeping BP optimal, avoiding NSAIDs, remaining hydrated Lab Results  Component Value Date   EGFR 56 (L) 06/14/2022   EGFR 49 (L) 03/19/2022   EGFR 47 (L) 05/27/2021

## 2022-06-30 NOTE — Assessment & Plan Note (Signed)
Seems chronic at this point - 1-2 years of pain, mostly constant Lipase normal multiple times Not changed with tx of GERD She has GI she can f/up with Also going to ask urology about sx to see if they are related to surgeries

## 2022-06-30 NOTE — Assessment & Plan Note (Signed)
Currently managed on metoprolol (been on for decades) added losartan but cause acute worsening of LUQ/left side/left flank pain and decreased urine output Off losartan her BP has been at goal - discussed norvasc as an option if needed Pt reports good med compliance and denies any SE.   Blood pressure today is elevated initially (stressed and rushed today) repeated and well controlled. BP Readings from Last 3 Encounters:  06/30/22 124/76  06/14/22 128/84  03/19/22 136/72   Pt denies CP, SOB, exertional sx, LE edema, palpitation, Ha's, visual disturbances, lightheadedness, hypotension, syncope. Dietary efforts for BP - working on diet/lifestyle and losing weight  BP rechecked today at goal - no med changes at this time If elevated in the future consider adding norvasc Ask urology about ACEI/ARB affect on her urine output/flank pain?  Could it be related to surgery?

## 2022-06-30 NOTE — Assessment & Plan Note (Signed)
Intermittent abd pain - she still [place] need to f/up with GI She is monitoring sx and foods

## 2022-06-30 NOTE — Assessment & Plan Note (Signed)
Chronic at this point, unsure if secondary to surgery She is going to reestablish with Dr. Berneice Heinrich and ask him about pain Prior chest/abd xrays neg, lipase normal checked multiple times Unable to get CT imaging - she does have some cancer monitoring scans coming up

## 2022-06-30 NOTE — Assessment & Plan Note (Signed)
On wellbutrin daily, doing well with this, at 300mg  daily dose Pills changes since she is taking all in am Phq 9 reviewed and negative today    06/30/2022    1:58 PM 06/14/2022    1:17 PM 03/19/2022    8:52 AM  Depression screen PHQ 2/9  Decreased Interest 0 0 0  Down, Depressed, Hopeless 0 0 0  PHQ - 2 Score 0 0 0  Altered sleeping 0 0 0  Tired, decreased energy 0 0 0  Change in appetite 0 0 0  Feeling bad or failure about yourself  0 0 0  Trouble concentrating 0 0 0  Moving slowly or fidgety/restless 0 0 0  Suicidal thoughts 0 0 0  PHQ-9 Score 0 0 0  Difficult doing work/chores Not difficult at all Not difficult at all

## 2022-06-30 NOTE — Assessment & Plan Note (Signed)
Last OV discussed tx options Prior hysterectomy Then with cancer surgery they removed ovaries and she had increased sx With ASCVD risk she would be a candidate for transdermal estrogen replacement We did start vaginal estrogen creams with new chronic vaginitis since surgery

## 2022-06-30 NOTE — Assessment & Plan Note (Signed)
She has maxed out oral supplement Recheck labs Can start b12 shots if level not >400

## 2022-07-01 LAB — COMPLETE METABOLIC PANEL WITH GFR
AG Ratio: 1.6 (calc) (ref 1.0–2.5)
ALT: 12 U/L (ref 6–29)
AST: 10 U/L (ref 10–35)
Albumin: 4.2 g/dL (ref 3.6–5.1)
Alkaline phosphatase (APISO): 89 U/L (ref 37–153)
BUN/Creatinine Ratio: 21 (calc) (ref 6–22)
BUN: 27 mg/dL — ABNORMAL HIGH (ref 7–25)
CO2: 29 mmol/L (ref 20–32)
Calcium: 9.5 mg/dL (ref 8.6–10.4)
Chloride: 105 mmol/L (ref 98–110)
Creat: 1.28 mg/dL — ABNORMAL HIGH (ref 0.50–1.03)
Globulin: 2.7 g/dL (calc) (ref 1.9–3.7)
Glucose, Bld: 102 mg/dL — ABNORMAL HIGH (ref 65–99)
Potassium: 4.7 mmol/L (ref 3.5–5.3)
Sodium: 143 mmol/L (ref 135–146)
Total Bilirubin: 0.3 mg/dL (ref 0.2–1.2)
Total Protein: 6.9 g/dL (ref 6.1–8.1)
eGFR: 49 mL/min/{1.73_m2} — ABNORMAL LOW (ref >=60)

## 2022-07-01 LAB — VITAMIN D 25 HYDROXY (VIT D DEFICIENCY, FRACTURES): Vit D, 25-Hydroxy: 34 ng/mL (ref 30–100)

## 2022-07-01 LAB — HEMOGLOBIN A1C
Hgb A1c MFr Bld: 6.1 % of total Hgb — ABNORMAL HIGH (ref <5.7)
Mean Plasma Glucose: 128 mg/dL
eAG (mmol/L): 7.1 mmol/L

## 2022-07-01 LAB — VITAMIN B12: Vitamin B-12: 694 pg/mL (ref 200–1100)

## 2022-07-19 ENCOUNTER — Other Ambulatory Visit: Payer: Self-pay | Admitting: Family Medicine

## 2022-07-19 DIAGNOSIS — R109 Unspecified abdominal pain: Secondary | ICD-10-CM

## 2022-07-20 NOTE — Telephone Encounter (Signed)
PA was done and approved

## 2022-07-20 NOTE — Telephone Encounter (Signed)
PA has been done and pt notified

## 2022-09-06 ENCOUNTER — Other Ambulatory Visit: Payer: Self-pay | Admitting: Family Medicine

## 2022-09-06 DIAGNOSIS — I10 Essential (primary) hypertension: Secondary | ICD-10-CM

## 2022-12-29 ENCOUNTER — Ambulatory Visit (HOSPITAL_COMMUNITY)
Admission: RE | Admit: 2022-12-29 | Discharge: 2022-12-29 | Disposition: A | Discharge status: Home / Self Care | Payer: 59 | Source: Ambulatory Visit | Attending: Urology | Admitting: Urology

## 2022-12-29 ENCOUNTER — Ambulatory Visit: Payer: 59 | Admitting: Family Medicine

## 2022-12-29 ENCOUNTER — Other Ambulatory Visit (HOSPITAL_COMMUNITY): Payer: Self-pay | Admitting: Urology

## 2022-12-29 DIAGNOSIS — C678 Malignant neoplasm of overlapping sites of bladder: Secondary | ICD-10-CM | POA: Diagnosis not present

## 2022-12-29 DIAGNOSIS — K439 Ventral hernia without obstruction or gangrene: Secondary | ICD-10-CM | POA: Diagnosis not present

## 2022-12-29 DIAGNOSIS — C679 Malignant neoplasm of bladder, unspecified: Secondary | ICD-10-CM | POA: Diagnosis not present

## 2022-12-29 DIAGNOSIS — Z9049 Acquired absence of other specified parts of digestive tract: Secondary | ICD-10-CM | POA: Diagnosis not present

## 2023-01-26 ENCOUNTER — Ambulatory Visit: Payer: 59 | Admitting: Family Medicine

## 2023-01-31 ENCOUNTER — Encounter: Payer: 59 | Admitting: Dermatology

## 2023-02-03 ENCOUNTER — Encounter: Payer: 59 | Admitting: Dermatology

## 2023-02-10 ENCOUNTER — Other Ambulatory Visit: Payer: Self-pay | Admitting: Family Medicine

## 2023-02-10 DIAGNOSIS — R109 Unspecified abdominal pain: Secondary | ICD-10-CM

## 2023-02-10 NOTE — Telephone Encounter (Signed)
Needs f/u appt 

## 2023-02-10 NOTE — Telephone Encounter (Signed)
Lvm for pt to return call. A refill was requested by pharmacy (it has been sent in) however the providers has asked that she schedule a future appt for additional refills.

## 2023-03-17 ENCOUNTER — Other Ambulatory Visit: Payer: Self-pay | Admitting: Family Medicine

## 2023-03-17 DIAGNOSIS — R109 Unspecified abdominal pain: Secondary | ICD-10-CM

## 2023-03-19 ENCOUNTER — Other Ambulatory Visit: Payer: Self-pay | Admitting: Family Medicine

## 2023-03-19 DIAGNOSIS — I10 Essential (primary) hypertension: Secondary | ICD-10-CM

## 2023-03-20 ENCOUNTER — Other Ambulatory Visit: Payer: Self-pay | Admitting: Family Medicine

## 2023-03-20 DIAGNOSIS — R109 Unspecified abdominal pain: Secondary | ICD-10-CM

## 2023-03-21 MED ORDER — PANTOPRAZOLE SODIUM 40 MG PO TBEC: 40.0000 mg | DELAYED_RELEASE_TABLET | Freq: Every day | ORAL | 0 refills | Status: DC

## 2023-03-29 ENCOUNTER — Emergency Department: Payer: 59

## 2023-03-29 ENCOUNTER — Encounter: Payer: Self-pay | Admitting: Radiology

## 2023-03-29 ENCOUNTER — Other Ambulatory Visit: Payer: Self-pay

## 2023-03-29 ENCOUNTER — Emergency Department
Admission: EM | Admit: 2023-03-29 | Discharge: 2023-03-29 | Disposition: A | Discharge status: Home / Self Care | Payer: 59 | Attending: Student in an Organized Health Care Education/Training Program | Admitting: Student in an Organized Health Care Education/Training Program

## 2023-03-29 DIAGNOSIS — R1084 Generalized abdominal pain: Secondary | ICD-10-CM | POA: Insufficient documentation

## 2023-03-29 DIAGNOSIS — N1330 Unspecified hydronephrosis: Secondary | ICD-10-CM | POA: Diagnosis not present

## 2023-03-29 DIAGNOSIS — R339 Retention of urine, unspecified: Secondary | ICD-10-CM | POA: Diagnosis not present

## 2023-03-29 DIAGNOSIS — R109 Unspecified abdominal pain: Secondary | ICD-10-CM | POA: Diagnosis not present

## 2023-03-29 LAB — COMPREHENSIVE METABOLIC PANEL
ALT: 18 U/L (ref 0–44)
AST: 18 U/L (ref 15–41)
Albumin: 4.4 g/dL (ref 3.5–5.0)
Alkaline Phosphatase: 91 U/L (ref 38–126)
Anion gap: 12 (ref 5–15)
BUN: 18 mg/dL (ref 6–20)
CO2: 23 mmol/L (ref 22–32)
Calcium: 9.7 mg/dL (ref 8.9–10.3)
Chloride: 106 mmol/L (ref 98–111)
Creatinine, Ser: 1.08 mg/dL — ABNORMAL HIGH (ref 0.44–1.00)
GFR, Estimated: 60 mL/min — ABNORMAL LOW (ref >60)
Glucose, Bld: 103 mg/dL — ABNORMAL HIGH (ref 70–99)
Potassium: 4.6 mmol/L (ref 3.5–5.1)
Sodium: 141 mmol/L (ref 135–145)
Total Bilirubin: 0.6 mg/dL (ref 0.0–1.2)
Total Protein: 7.9 g/dL (ref 6.5–8.1)

## 2023-03-29 LAB — CBC
HCT: 43.3 % (ref 36.0–46.0)
Hemoglobin: 13.7 g/dL (ref 12.0–15.0)
MCH: 28.9 pg (ref 26.0–34.0)
MCHC: 31.6 g/dL (ref 30.0–36.0)
MCV: 91.4 fL (ref 80.0–100.0)
Platelets: 338 10*3/uL (ref 150–400)
RBC: 4.74 MIL/uL (ref 3.87–5.11)
RDW: 14.7 % (ref 11.5–15.5)
WBC: 14.7 10*3/uL — ABNORMAL HIGH (ref 4.0–10.5)
nRBC: 0 % (ref 0.0–0.2)

## 2023-03-29 LAB — URINALYSIS, ROUTINE W REFLEX MICROSCOPIC
Bilirubin Urine: NEGATIVE
Glucose, UA: NEGATIVE mg/dL
Hgb urine dipstick: NEGATIVE
Ketones, ur: NEGATIVE mg/dL
Nitrite: POSITIVE — AB
Protein, ur: NEGATIVE mg/dL
Specific Gravity, Urine: 1.01 (ref 1.005–1.030)
WBC, UA: >50 WBC/hpf (ref 0–5)
pH: 6 (ref 5.0–8.0)

## 2023-03-29 MED ORDER — OXYCODONE-ACETAMINOPHEN 5-325 MG PO TABS
1.0000 | ORAL_TABLET | Freq: Once | ORAL | Status: AC
  Administered 2023-03-29: 1 via ORAL
  Filled 2023-03-29: qty 1

## 2023-03-29 MED ORDER — ONDANSETRON HCL 4 MG/2ML IJ SOLN
4.0000 mg | Freq: Once | INTRAMUSCULAR | Status: AC
  Administered 2023-03-29: 4 mg via INTRAVENOUS
  Filled 2023-03-29: qty 2

## 2023-03-29 MED ORDER — IOHEXOL 350 MG/ML SOLN
100.0000 mL | Freq: Once | INTRAVENOUS | Status: AC | PRN
  Administered 2023-03-29: 100 mL via INTRAVENOUS

## 2023-03-29 MED ORDER — MORPHINE SULFATE (PF) 4 MG/ML IV SOLN
4.0000 mg | INTRAVENOUS | Status: DC | PRN
  Administered 2023-03-29: 4 mg via INTRAVENOUS
  Filled 2023-03-29: qty 1

## 2023-03-29 MED ORDER — CIPROFLOXACIN HCL 250 MG PO TABS: 250.0000 mg | ORAL_TABLET | Freq: Two times a day (BID) | ORAL | 0 refills | Status: AC

## 2023-03-29 NOTE — ED Notes (Signed)
 See triage note  States she has had bladder cancer   and had her bladder removed   Has had periods of urinary retention  States it comes and goes

## 2023-03-29 NOTE — ED Provider Triage Note (Signed)
 Emergency Medicine Provider Triage Evaluation Note  Susan Snyder , a 58 y.o. female  was evaluated in triage.  Pt complains of urostomy bag problem stating that it is not working properly x 3 days. Denies fever. Endorses 1 episode of vomit yesterday and nausea today. Intermittent abdominal pain   Review of Systems  Positive:  Negative:   Physical Exam  There were no vitals taken for this visit. Gen:   Awake, no distress   Resp:  Normal effort  MSK:   Moves extremities without difficulty  Other:  Urostomy bag in place  Medical Decision Making  Medically screening exam initiated at 3:42 PM.  Appropriate orders placed.  Terril Amaro Hallum was informed that the remainder of the evaluation will be completed by another provider, this initial triage assessment does not replace that evaluation, and the importance of remaining in the ED until their evaluation is complete.     Margrette, Ransom Nickson A, PA-C 03/29/23 1547

## 2023-03-29 NOTE — ED Notes (Signed)
 While sitting speaking with patient she stated she had a severe cramp come across her abd and then she released about of urine into her bag.

## 2023-03-29 NOTE — ED Triage Notes (Signed)
 Bladder removed due to cancer 3 years ago. Pt states she has a ileostomy and states she has had very little output since Sunday. Pt was referred here to make sure she doesn't have a stone or if she [place] have a twist. Pt states she has abdominal pain that feels like she is having a baby.

## 2023-03-29 NOTE — ED Provider Notes (Signed)
 [facility] Provider Note    Event Date/Time   First MD Initiated Contact with Patient 03/29/23 1841     (approximate)   History   Urinary Retention and Abdominal Pain   HPI  Susan Snyder is a 58 y.o. female who presents to the ER for evaluation of generalized crampy abdominal pain and decreased ileostomy output.  Symptoms started 2 days ago.  Describes the pain is moderate to severe and crampy in nature.  No fevers.  Has had this happen before and then it spontaneously resolves.  States that despite pain earlier this morning upon arrival to the ER she had ileostomy output again.  Denies any fevers but has been uncomfortable.     Physical Exam   Triage Vital Signs: ED Triage Vitals  Encounter Vitals Group     BP 03/29/23 1547 (!) 167/107     Systolic BP Percentile --      Diastolic BP Percentile --      Pulse Rate 03/29/23 1547 (!) 112     Resp 03/29/23 1547 18     Temp 03/29/23 1547 98.3 F (36.8 C)     Temp Source 03/29/23 1547 Oral     SpO2 03/29/23 1547 98 %     Weight 03/29/23 1548 208 lb (94.3 kg)     Height 03/29/23 1548 5' 9 (1.753 m)     Head Circumference --      Peak Flow --      Pain Score 03/29/23 1835 2     Pain Loc --      Pain Education --      Exclude from Growth Chart --     Most recent vital signs: Vitals:   03/29/23 1925 03/29/23 2032  BP: (!) 121/101   Pulse: 75   Resp: 19   Temp:  98.3 F (36.8 C)  SpO2: 98%      Constitutional: Alert  Eyes: Conjunctivae are normal.  Head: Atraumatic. Nose: No congestion/rhinnorhea. Mouth/Throat: Mucous membranes are moist.   Neck: Painless ROM.  Cardiovascular:   Good peripheral circulation. Respiratory: Normal respiratory effort.  No retractions.  Gastrointestinal: Soft mild tenderness to palpation no guarding or rebound tenderness. Musculoskeletal:  no deformity Neurologic:  MAE spontaneously. No gross focal neurologic deficits are appreciated.  Skin:  Skin is warm,  dry and intact. No rash noted. Psychiatric: Mood and affect are normal. Speech and behavior are normal.    ED Results / Procedures / Treatments   Labs (all labs ordered are listed, but only abnormal results are displayed) Labs Reviewed  CBC - Abnormal; Notable for the following components:      Result Value   WBC 14.7 (*)    All other components within normal limits  COMPREHENSIVE METABOLIC PANEL - Abnormal; Notable for the following components:   Glucose, Bld 103 (*)    Creatinine, Ser 1.08 (*)    GFR, Estimated 60 (*)    All other components within normal limits  URINALYSIS, ROUTINE W REFLEX MICROSCOPIC - Abnormal; Notable for the following components:   Color, Urine YELLOW (*)    APPearance CLOUDY (*)    Nitrite POSITIVE (*)    Leukocytes,Ua MODERATE (*)    Bacteria, UA RARE (*)    All other components within normal limits     RADIOLOGY Please see ED Course for my review and interpretation.  I personally reviewed all radiographic images ordered to evaluate for the above acute complaints and reviewed radiology reports and  findings.  These findings were personally discussed with the patient.  Please see medical record for radiology report.    PROCEDURES:  Critical Care performed: No  Procedures   MEDICATIONS ORDERED IN ED: Medications  morphine  (PF) 4 MG/ML injection 4 mg (4 mg Intravenous Given 03/29/23 1919)  ondansetron  (ZOFRAN ) injection 4 mg (4 mg Intravenous Given 03/29/23 1919)  iohexol  (OMNIPAQUE ) 350 MG/ML injection 100 mL (100 mLs Intravenous Contrast Given 03/29/23 1959)     IMPRESSION / MDM / ASSESSMENT AND PLAN / ED COURSE  I reviewed the triage vital signs and the nursing notes.                              Differential diagnosis includes, but is not limited to, obstruction, UTI, stone, SBO, hernia, appendicitis, colitis  Patient presenting to the ER for evaluation of symptoms as described above.  Based on symptoms, risk factors and considered above  differential, this presenting complaint could reflect a potentially life-threatening illness therefore the patient will be placed on continuous pulse oximetry and telemetry for monitoring.  Laboratory evaluation will be sent to evaluate for the above complaints.  Imaging will be ordered for the but differential.    Clinical Course as of 03/29/23 2045  Tue Mar 29, 2023  2042 Imaging is reassuring.  Patient is draining from the ileostomy.  Stoma nonischemic and nonobstructed appearing given her otherwise reassuring exam on discussed case in consultation with urology.  I do not see any patient for further diagnostic testing here in the ER at this time.  Will send urine for culture.  Patient not describing any particular UTI symptoms.  Will send prescription for antibiotic in case she starts developing UTI symptoms.  This is not consistent with pyelonephritis.  Does appear stable and appropriate for outpatient follow-up. [PR]    Clinical Course User Index [PR] Lang Dover, MD     FINAL CLINICAL IMPRESSION(S) / ED DIAGNOSES   Final diagnoses:  Generalized abdominal pain     Rx / DC Orders   ED Discharge Orders          Ordered    ciprofloxacin  (CIPRO ) 250 MG tablet  2 times daily        03/29/23 2045             Note:  This document was prepared using Dragon voice recognition software and [place] include unintentional dictation errors.    Lang Dover, MD 03/29/23 2045

## 2023-04-05 LAB — URINE CULTURE: Culture: >=100000 — AB

## 2023-04-06 ENCOUNTER — Telehealth: Payer: Self-pay

## 2023-04-06 NOTE — Progress Notes (Signed)
 Transition Care Management Follow-up Telephone Call Date of discharge and from where: 03/29/2023 [facility] How have you been since you were released from the hospital? Patient stated she is feeling better. Any questions or concerns? No  Items Reviewed: Did the pt receive and understand the discharge instructions provided? Yes  Medications obtained and verified? Yes  Other? No  Any new allergies since your discharge? No  Dietary orders reviewed? Yes Do you have support at home? Yes   Follow up appointments reviewed:  PCP Hospital f/u appt confirmed? Yes  Scheduled to see Adeline Hone, PA-C on 04/27/2023 @ [facility]. Specialist Hospital f/u appt confirmed?  Patient stated she has left several messages for her Oncologist and has not received a response.  Scheduled to see  on  @ . Are transportation arrangements needed? No  If their condition worsens, is the pt aware to call PCP or go to the Emergency Dept.? Yes Was the patient provided with contact information for the PCP's office or ED? Yes Was to pt encouraged to call back with questions or concerns? Yes   Arlina Sabina [facility]  [facility] Guide Direct Dial: [phone]  Website: [email or web address]

## 2023-04-08 ENCOUNTER — Telehealth: Payer: Self-pay

## 2023-04-08 NOTE — Telephone Encounter (Signed)
Spoke with patient. She is still having trouble not as bad. Made an appointment for 04/28/23. Patient has not heard back from her surgeon office yet but she did reach out to them and will update [place] if anything changes in the meantime. She appreciates Dr Apolinar Junes reaching out

## 2023-04-08 NOTE — Telephone Encounter (Signed)
-----   Message from Vanna Scotland sent at 04/07/2023  3:32 PM EST ----- Regarding: FW: ER follow up Please reach out to this patient and see if she still having issues with output from her ileostomy.  If so, please make her an appointment as they were standing for evaluation.  She Estrella need a loopogram.  Vanna Scotland, MD ----- Message ----- From: Willy Eddy, MD Sent: 03/29/2023   8:42 PM EST To: Vanna Scotland, MD Subject: ER follow up                                   FYI: This patient presented to ED with abdominal pain and decreased output from ileostomy for several hours.  Stoma looks ok and is now draining.  Imaging otherwise reassuring.  I discussed with Stoioff and he mentioned getting her into to clinic for possible loopogram etc.  Luisa Hart

## 2023-04-23 ENCOUNTER — Other Ambulatory Visit: Payer: Self-pay | Admitting: Family Medicine

## 2023-04-23 DIAGNOSIS — R109 Unspecified abdominal pain: Secondary | ICD-10-CM

## 2023-04-25 NOTE — Telephone Encounter (Signed)
Requested medication (s) are due for refill today:   Not sure    It has been refused the last 2-3 times it's been requested.  Requested medication (s) are on the active medication list:   Yes   Future visit scheduled:   Yes 2/5   Last ordered: 03/21/2023 #30, 0 refills  Returned for provider review since only 30 day supply given and it's been refused the last 2-3 times it's been requested.   Has upcoming appt.   Requested Prescriptions  Pending Prescriptions Disp Refills   pantoprazole (PROTONIX) 40 MG tablet [Pharmacy Med Name: PANTOPRAZOLE SOD DR 40 MG TAB] 30 tablet 0    Sig: TAKE 1 TABLET BY MOUTH EVERY DAY     Gastroenterology: Proton Pump Inhibitors Passed - 04/25/2023  1:21 PM      Passed - Valid encounter within last 12 months    Recent Outpatient Visits           9 months ago Mixed hyperlipidemia   [facility] Danelle Berry, PA-C   10 months ago Left upper quadrant abdominal pain   [facility] Danelle Berry, PA-C   1 year ago Well adult exam   [facility] Danelle Berry, PA-C   1 year ago Neck pain on right side   [facility] Danelle Berry, PA-C   1 year ago Flank pain   [facility] Danelle Berry, PA-C       Future Appointments             In 2 days Danelle Berry, PA-C [facility]   In 3 days Vanna Scotland, MD [facility]

## 2023-04-27 ENCOUNTER — Encounter: Payer: Self-pay | Admitting: Family Medicine

## 2023-04-27 ENCOUNTER — Ambulatory Visit: Payer: 59 | Admitting: Family Medicine

## 2023-04-27 ENCOUNTER — Ambulatory Visit (INDEPENDENT_AMBULATORY_CARE_PROVIDER_SITE_OTHER): Payer: 59 | Admitting: Family Medicine

## 2023-04-27 VITALS — BP 136/78 | HR 86 | Resp 16 | Ht 69.0 in | Wt 208.0 lb

## 2023-04-27 DIAGNOSIS — Z5181 Encounter for therapeutic drug level monitoring: Secondary | ICD-10-CM

## 2023-04-27 DIAGNOSIS — K52831 Collagenous colitis: Secondary | ICD-10-CM | POA: Diagnosis not present

## 2023-04-27 DIAGNOSIS — E538 Deficiency of other specified B group vitamins: Secondary | ICD-10-CM | POA: Diagnosis not present

## 2023-04-27 DIAGNOSIS — F321 Major depressive disorder, single episode, moderate: Secondary | ICD-10-CM | POA: Diagnosis not present

## 2023-04-27 DIAGNOSIS — K9049 Malabsorption due to intolerance, not elsewhere classified: Secondary | ICD-10-CM | POA: Diagnosis not present

## 2023-04-27 DIAGNOSIS — N1832 Chronic kidney disease, stage 3b: Secondary | ICD-10-CM | POA: Diagnosis not present

## 2023-04-27 DIAGNOSIS — E559 Vitamin D deficiency, unspecified: Secondary | ICD-10-CM

## 2023-04-27 DIAGNOSIS — I10 Essential (primary) hypertension: Secondary | ICD-10-CM

## 2023-04-27 DIAGNOSIS — E782 Mixed hyperlipidemia: Secondary | ICD-10-CM | POA: Diagnosis not present

## 2023-04-27 DIAGNOSIS — R7303 Prediabetes: Secondary | ICD-10-CM | POA: Diagnosis not present

## 2023-04-27 MED ORDER — BUPROPION HCL ER (XL) 150 MG PO TB24: 150.0000 mg | ORAL_TABLET | Freq: Every morning | ORAL | 1 refills | Status: AC

## 2023-04-27 NOTE — Progress Notes (Signed)
 [name], female    DOB: January 09, 1966, 58 y.o.   MRN: [id number]  PCP: Leavy Mole, PA-C  Chief Complaint  Patient presents with   Medication Refill   Anxiety    Subjective:   Susan Snyder is a 58 y.o. female, presents to clinic with CC of the following:  HPI  Here for anxiety sx mostly due to stress, job and health stress and also caring for her father who is on hospice She also need routine f/up, stopped taking almost all her meds a efw weeks ago, states she stopped taking care of her own health at all for most of the last year   She's been on wellbutrin  for years but stopped taking it in 3 weeks - previously on 150 and 300 - unclear - she was last prescribed 300 but not taking it She has some things that are better but also feels worse too    04/27/2023    1:24 PM 06/30/2022    1:58 PM 06/14/2022    1:17 PM 03/19/2022    8:52 AM 02/05/2022    3:29 PM  Depression screen PHQ 2/9  Decreased Interest 1 0 0 0 0  Down, Depressed, Hopeless 1 0 0 0 0  PHQ - 2 Score 2 0 0 0 0  Altered sleeping 0 0 0 0 0  Tired, decreased energy 3 0 0 0 0  Change in appetite 0 0 0 0 0  Feeling bad or failure about yourself  0 0 0 0 0  Trouble concentrating 0 0 0 0 0  Moving slowly or fidgety/restless 0 0 0 0 0  Suicidal thoughts 0 0 0 0 0  PHQ-9 Score 5 0 0 0 0  Difficult doing work/chores  Not difficult at all Not difficult at all  Not difficult at all      04/27/2023    1:25 PM 05/27/2021    1:28 PM 06/26/2018   10:01 AM  GAD 7 : Generalized Anxiety Score  Nervous, Anxious, on Edge 3 1 3   Control/stop worrying 3 1 3   Worry too much - different things 3 1 0  Trouble relaxing 3 1 0  Restless 3 1 3   Easily annoyed or irritable 3 2 0  Afraid - awful might happen 3 2 1   Total GAD 7 Score 21 9 10   Anxiety Difficulty  Somewhat difficult Not difficult at all   She stopped taking metoprolol , in the past we tried to switch her from BB to ACEI/ARB but he had intolerable SE  BP  Readings from Last 6 Encounters:  04/27/23 136/78  03/29/23 (!) 121/101  06/30/22 124/76  06/14/22 128/84  03/19/22 136/72  02/05/22 (!) 142/78   Hx of prediabetes, she has lost some weight Lab Results  Component Value Date   HGBA1C 6.1 (H) 06/30/2022   Additional history of multiple deficiencies due to malabsorption secondary to collagenous colitis, she has still not followed up with GI this previously established and prescribed oral steroids to manage She reports chronic diarrhea, no change   She continues to have a issues with her urostomy -she is seeing Dr. Penne for this tomorrow, she is a hard knot behind it feels uncomfortable, abnormal draining sometimes does not have any urine flow for days and hours, multiple ER visits  Patient Active Problem List   Diagnosis Date Noted   Surgical menopause, symptomatic 06/30/2022   Flank pain 06/30/2022   Left upper quadrant abdominal pain 06/30/2022  Prediabetes 03/26/2022   Stage 3b chronic kidney disease (CKD) (HCC) 03/26/2022   Current moderate episode of major depressive disorder, unspecified whether recurrent (HCC) 03/26/2022   Presence of urostomy (HCC) 02/05/2022   History of colonic polyps    Polyp of transverse colon    Mixed hyperlipidemia 12/20/2019   Major depressive disorder with current active episode 08/01/2019   Vitamin B12 deficiency 01/14/2015   Vitamin D  deficiency 01/14/2015   Class 2 severe obesity with serious comorbidity and body mass index (BMI) of 35.0 to 35.9 in adult [facility]) 01/14/2015   Hypertension    Collagenous colitis    Malabsorption due to intolerance, not elsewhere classified 01/02/2014   Lumbar disc herniation with radiculopathy 05/22/2013   Cervical spondylosis 02/06/2013   Intervertebral cervical disc disorder with myelopathy, cervical region 02/06/2013      Current Outpatient Medications:    Cyanocobalamin  (B-12) 1000 MCG SUBL, Place under the tongue daily., Disp: , Rfl:     pantoprazole  (PROTONIX ) 40 MG tablet, TAKE 1 TABLET BY MOUTH EVERY DAY, Disp: 30 tablet, Rfl: 0   VITAMIN D  PO, Take by mouth., Disp: , Rfl:    aspirin  EC 81 MG tablet, Take 1 tablet (81 mg total) by mouth daily. (Patient not taking: Reported on 04/27/2023), Disp: , Rfl:    atorvastatin  (LIPITOR) 10 MG tablet, Take 1 tablet (10 mg total) by mouth at bedtime. (Patient not taking: Reported on 04/27/2023), Disp: 90 tablet, Rfl: 2   Baclofen  5 MG TABS, Take 5 mg by mouth 3 (three) times daily as needed (msk pain/tightness or spasms neck, back, shoulders). (Patient not taking: Reported on 04/27/2023), Disp: 30 tablet, Rfl: 0   buPROPion  (WELLBUTRIN  XL) 300 MG 24 hr tablet, Take 1 tablet (300 mg total) by mouth daily. (Patient not taking: Reported on 04/27/2023), Disp: 90 tablet, Rfl: 3   metoprolol  succinate (TOPROL -XL) 25 MG 24 hr tablet, TAKE 1 TABLET BY MOUTH EVERYDAY AT BEDTIME (Patient not taking: Reported on 04/27/2023), Disp: 30 tablet, Rfl: 1   Allergies  Allergen Reactions   Adhesive [Tape] Other (See Comments)    Blisters   Gabapentin Nausea And Vomiting   Hydrocodone -Acetaminophen  Nausea And Vomiting    hallucinations   Keflex [Cephalexin] Shortness Of Breath and Palpitations   Sulfa Antibiotics Shortness Of Breath and Palpitations   Penicillins Rash    Did it involve swelling of the face/tongue/throat, SOB, or low BP? No Did it involve sudden or severe rash/hives, skin peeling, or any reaction on the inside of your mouth or nose? No Did you need to seek medical attention at a hospital or doctor's office? No When did it last happen?      Childhood If all above answers are "NO", [place]ell proceed with cephalosporin use.      Social History   Tobacco Use   Smoking status: Former    Current packs/day: 0.00    Average packs/day: 1 pack/day for 30.0 years (30.0 ttl pk-yrs)    Types: Cigarettes    Start date: 09/21/1989    Quit date: 09/22/2019    Years since quitting: 3.5   Smokeless tobacco:  Never  Vaping Use   Vaping status: Former   Substances: Nicotine  Substance Use Topics   Alcohol use: Not Currently    Comment: rare   Drug use: No      Chart Review Today: I personally reviewed active problem list, medication list, allergies, family history, social history, health maintenance, notes from last encounter, lab results, imaging with the  patient/caregiver today.   Review of Systems  Constitutional: Negative.   HENT: Negative.    Eyes: Negative.   Respiratory: Negative.    Cardiovascular: Negative.   Gastrointestinal: Negative.   Endocrine: Negative.   Genitourinary: Negative.   Musculoskeletal: Negative.   Skin: Negative.   Allergic/Immunologic: Negative.   Neurological: Negative.   Hematological: Negative.   Psychiatric/Behavioral: Negative.    All other systems reviewed and are negative.      Objective:   Vitals:   04/27/23 1320  BP: 136/78  Pulse: 86  Resp: 16  SpO2: 97%  Weight: 208 lb (94.3 kg)  Height: 5' 9 (1.753 m)    Body mass index is 30.72 kg/m.  Physical Exam Vitals and nursing note reviewed.  Constitutional:      General: She is not in acute distress.    Appearance: Normal appearance. She is well-developed. She is obese. She is not ill-appearing, toxic-appearing or diaphoretic.  HENT:     Head: Normocephalic and atraumatic.     Nose: Nose normal.  Eyes:     General:        Right eye: No discharge.        Left eye: No discharge.     Conjunctiva/sclera: Conjunctivae normal.  Neck:     Trachea: No tracheal deviation.  Cardiovascular:     Rate and Rhythm: Normal rate and regular rhythm.     Pulses: Normal pulses.     Heart sounds: Normal heart sounds.  Pulmonary:     Effort: Pulmonary effort is normal. No respiratory distress.     Breath sounds: Normal breath sounds. No stridor.  Skin:    General: Skin is warm and dry.     Findings: No rash.  Neurological:     Mental Status: She is alert.     Motor: No abnormal muscle  tone.     Coordination: Coordination normal.  Psychiatric:        Mood and Affect: Mood normal.        Behavior: Behavior normal.      Results for orders placed or performed during the hospital encounter of 03/29/23  CBC   Collection Time: 03/29/23  3:43 PM  Result Value Ref Range   WBC 14.7 (H) 4.0 - 10.5 K/uL   RBC 4.74 3.87 - 5.11 MIL/uL   Hemoglobin 13.7 12.0 - 15.0 g/dL   HCT 56.6 63.9 - 53.9 %   MCV 91.4 80.0 - 100.0 fL   MCH 28.9 26.0 - 34.0 pg   MCHC 31.6 30.0 - 36.0 g/dL   RDW 85.2 88.4 - 84.4 %   Platelets 338 150 - 400 K/uL   nRBC 0.0 0.0 - 0.2 %  Comprehensive metabolic panel   Collection Time: 03/29/23  3:43 PM  Result Value Ref Range   Sodium 141 135 - 145 mmol/L   Potassium 4.6 3.5 - 5.1 mmol/L   Chloride 106 98 - 111 mmol/L   CO2 23 22 - 32 mmol/L   Glucose, Bld 103 (H) 70 - 99 mg/dL   BUN 18 6 - 20 mg/dL   Creatinine, Ser 8.91 (H) 0.44 - 1.00 mg/dL   Calcium  9.7 8.9 - 10.3 mg/dL   Total Protein 7.9 6.5 - 8.1 g/dL   Albumin 4.4 3.5 - 5.0 g/dL   AST 18 15 - 41 U/L   ALT 18 0 - 44 U/L   Alkaline Phosphatase 91 38 - 126 U/L   Total Bilirubin 0.6 0.0 - 1.2 mg/dL  GFR, Estimated 60 (L) >60 mL/min   Anion gap 12 5 - 15  Urine Culture   Collection Time: 03/29/23  3:51 PM   Specimen: Urine, Clean Catch  Result Value Ref Range   Specimen Description      URINE, CLEAN CATCH Performed at [facility], [address], [place], [place] [address]    Special Requests      NONE Performed at [facility], [address], [place], [place] [address]    Culture (A)     >=100,000 COLONIES/mL SERRATIA MARCESCENS 80,000 COLONIES/mL PROVIDENCIA RETTGERI    Report Status 04/05/2023 FINAL    Organism ID, Bacteria SERRATIA MARCESCENS (A)    Organism ID, Bacteria PROVIDENCIA RETTGERI (A)       Susceptibility   Providencia rettgeri - MIC*    AMPICILLIN >=32 RESISTANT Resistant     CEFEPIME 0.5 SENSITIVE Sensitive     CEFTRIAXONE <=0.25 SENSITIVE  Sensitive     CIPROFLOXACIN  <=0.25 SENSITIVE Sensitive     GENTAMICIN  <=1 SENSITIVE Sensitive     IMIPENEM 1 SENSITIVE Sensitive     NITROFURANTOIN  128 RESISTANT Resistant     TRIMETH/SULFA <=20 SENSITIVE Sensitive     AMPICILLIN/SULBACTAM <=2 SENSITIVE Sensitive     PIP/TAZO >=128 RESISTANT Resistant ug/mL    * 80,000 COLONIES/mL PROVIDENCIA RETTGERI   Serratia marcescens - MIC*    CEFEPIME <=0.12 SENSITIVE Sensitive     CEFTRIAXONE <=0.25 SENSITIVE Sensitive     CIPROFLOXACIN  <=0.25 SENSITIVE Sensitive     GENTAMICIN  <=1 SENSITIVE Sensitive     NITROFURANTOIN  256 RESISTANT Resistant     TRIMETH/SULFA <=20 SENSITIVE Sensitive     * >=100,000 COLONIES/mL SERRATIA MARCESCENS  Urinalysis, Routine w reflex microscopic -Urine, Unspecified Source   Collection Time: 03/29/23  3:51 PM  Result Value Ref Range   Color, Urine YELLOW (A) YELLOW   APPearance CLOUDY (A) CLEAR   Specific Gravity, Urine 1.010 1.005 - 1.030   pH 6.0 5.0 - 8.0   Glucose, UA NEGATIVE NEGATIVE mg/dL   Hgb urine dipstick NEGATIVE NEGATIVE   Bilirubin Urine NEGATIVE NEGATIVE   Ketones, ur NEGATIVE NEGATIVE mg/dL   Protein, ur NEGATIVE NEGATIVE mg/dL   Nitrite POSITIVE (A) NEGATIVE   Leukocytes,Ua MODERATE (A) NEGATIVE   RBC / HPF 6-10 0 - 5 RBC/hpf   WBC, UA >50 0 - 5 WBC/hpf   Bacteria, UA RARE (A) NONE SEEN   Squamous Epithelial / HPF 0-5 0 - 5 /HPF   Mucus PRESENT    Amorphous Crystal PRESENT        Assessment & Plan:   Essential hypertension -     COMPLETE METABOLIC PANEL WITH GFR  Mixed hyperlipidemia Assessment & Plan: Stopped meds a few weeks ago, due for lipids Millis adjust meds pending results  She has tolerated statins in the past w/o SE or concerns  Orders: -     COMPLETE METABOLIC PANEL WITH GFR -     Lipid panel  Stage 3b chronic kidney disease (CKD) (HCC) Assessment & Plan: Would still like to get her on ACEI/ARB, did not tolerate losartan  Recheck renal function  Orders: -      COMPLETE METABOLIC PANEL WITH GFR  Current moderate episode of major depressive disorder, unspecified whether recurrent (HCC) Assessment & Plan: Increased stress and demand - 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  Dispense: 90 tablet; Refill: 1  Vitamin D  deficiency Assessment & Plan: Recheck, not currently supplementing  Orders: -     CBC with Differential/Platelet -     COMPLETE METABOLIC PANEL WITH GFR -     VITAMIN D  25 Hydroxy (Vit-D Deficiency, Fractures)  Vitamin B12 deficiency Assessment & Plan: Recheck, not on supplement  Orders: -     CBC with Differential/Platelet -     Vitamin B12  Collagenous colitis Assessment & Plan: Not currently managed by GI- lost to f/up and insurance hasn't covered recommended tx  Orders: -     CBC with Differential/Platelet  Prediabetes Assessment & Plan: Lab Results  Component Value Date   HGBA1C 6.1 (H) 06/30/2022  Not on meds, she has lost weight, no other specific diet efforts Recheck labs  Orders: -     COMPLETE METABOLIC PANEL WITH GFR -     Hemoglobin A1c  Malabsorption due to intolerance, not elsewhere classified -     CBC with Differential/Platelet -     COMPLETE METABOLIC PANEL WITH GFR -     VITAMIN D  25 Hydroxy (Vit-D Deficiency, Fractures) -     Vitamin B12  Encounter for medication monitoring -     CBC with  Differential/Platelet -     COMPLETE METABOLIC PANEL WITH GFR -     Hemoglobin A1c -     Lipid panel -     VITAMIN D  25 Hydroxy (Vit-D Deficiency, Fractures) -     Vitamin B12  Primary hypertension Assessment & Plan: Near goal today while off metoprolol  04/27/23 136/78  03/29/23 (!) 121/101  06/30/22 124/76  06/14/22 128/84  Pt can stay off BB and continue to monitor BP, gaol to have <130/80 Diet/lifestyle efforts encouraged as able, if not at goal then I would still recommend trying different med for management of HTN other than BB     Return for 1 month virtual appt for mood/stress/med.      Michelene Cower, PA-C 04/27/23 1:45 PM

## 2023-04-28 ENCOUNTER — Other Ambulatory Visit: Payer: Self-pay | Admitting: Family Medicine

## 2023-04-28 ENCOUNTER — Encounter: Payer: Self-pay | Admitting: Urology

## 2023-04-28 ENCOUNTER — Ambulatory Visit (INDEPENDENT_AMBULATORY_CARE_PROVIDER_SITE_OTHER): Payer: 59 | Admitting: Urology

## 2023-04-28 ENCOUNTER — Encounter: Payer: Self-pay | Admitting: Family Medicine

## 2023-04-28 VITALS — BP 155/89 | HR 109 | Ht 69.0 in | Wt 207.2 lb

## 2023-04-28 DIAGNOSIS — T85858A Stenosis due to other internal prosthetic devices, implants and grafts, initial encounter: Secondary | ICD-10-CM | POA: Diagnosis not present

## 2023-04-28 DIAGNOSIS — E782 Mixed hyperlipidemia: Secondary | ICD-10-CM

## 2023-04-28 LAB — COMPLETE METABOLIC PANEL WITH GFR
AG Ratio: 1.9 (calc) (ref 1.0–2.5)
ALT: 12 U/L (ref 6–29)
AST: 13 U/L (ref 10–35)
Albumin: 4.1 g/dL (ref 3.6–5.1)
Alkaline phosphatase (APISO): 80 U/L (ref 37–153)
BUN/Creatinine Ratio: 14 (calc) (ref 6–22)
BUN: 16 mg/dL (ref 7–25)
CO2: 28 mmol/L (ref 20–32)
Calcium: 9.5 mg/dL (ref 8.6–10.4)
Chloride: 106 mmol/L (ref 98–110)
Creat: 1.12 mg/dL — ABNORMAL HIGH (ref 0.50–1.03)
Globulin: 2.2 g/dL (ref 1.9–3.7)
Glucose, Bld: 89 mg/dL (ref 65–99)
Potassium: 4.5 mmol/L (ref 3.5–5.3)
Sodium: 140 mmol/L (ref 135–146)
Total Bilirubin: 0.2 mg/dL (ref 0.2–1.2)
Total Protein: 6.3 g/dL (ref 6.1–8.1)
eGFR: 57 mL/min/{1.73_m2} — ABNORMAL LOW (ref >=60)

## 2023-04-28 LAB — CBC WITH DIFFERENTIAL/PLATELET
Absolute Lymphocytes: 2820 {cells}/uL (ref 850–3900)
Absolute Monocytes: 410 {cells}/uL (ref 200–950)
Basophils Absolute: 40 {cells}/uL (ref 0–200)
Basophils Relative: 0.4 %
Eosinophils Absolute: 670 {cells}/uL — ABNORMAL HIGH (ref 15–500)
Eosinophils Relative: 6.7 %
HCT: 41.4 % (ref 35.0–45.0)
Hemoglobin: 12.9 g/dL (ref 11.7–15.5)
MCH: 28.2 pg (ref 27.0–33.0)
MCHC: 31.2 g/dL — ABNORMAL LOW (ref 32.0–36.0)
MCV: 90.4 fL (ref 80.0–100.0)
MPV: 11 fL (ref 7.5–12.5)
Monocytes Relative: 4.1 %
Neutro Abs: 6060 {cells}/uL (ref 1500–7800)
Neutrophils Relative %: 60.6 %
Platelets: 355 10*3/uL (ref 140–400)
RBC: 4.58 10*6/uL (ref 3.80–5.10)
RDW: 13.7 % (ref 11.0–15.0)
Total Lymphocyte: 28.2 %
WBC: 10 10*3/uL (ref 3.8–10.8)

## 2023-04-28 LAB — VITAMIN B12: Vitamin B-12: 1111 pg/mL — ABNORMAL HIGH (ref 200–1100)

## 2023-04-28 LAB — LIPID PANEL
Cholesterol: 198 mg/dL (ref <200)
HDL: 35 mg/dL — ABNORMAL LOW (ref >=50)
LDL Cholesterol (Calc): 122 mg/dL — ABNORMAL HIGH
Non-HDL Cholesterol (Calc): 163 mg/dL — ABNORMAL HIGH (ref <130)
Total CHOL/HDL Ratio: 5.7 (calc) — ABNORMAL HIGH (ref <5.0)
Triglycerides: 266 mg/dL — ABNORMAL HIGH (ref <150)

## 2023-04-28 LAB — HEMOGLOBIN A1C
Hgb A1c MFr Bld: 6 %{Hb} — ABNORMAL HIGH (ref <5.7)
Mean Plasma Glucose: 126 mg/dL
eAG (mmol/L): 7 mmol/L

## 2023-04-28 LAB — VITAMIN D 25 HYDROXY (VIT D DEFICIENCY, FRACTURES): Vit D, 25-Hydroxy: 32 ng/mL (ref 30–100)

## 2023-04-28 MED ORDER — ROSUVASTATIN CALCIUM 10 MG PO TABS: 10.0000 mg | ORAL_TABLET | Freq: Every morning | ORAL | 5 refills | Status: AC

## 2023-04-28 NOTE — Assessment & Plan Note (Signed)
 Recheck, not currently supplementing

## 2023-04-28 NOTE — Assessment & Plan Note (Signed)
 Lab Results  Component Value Date   HGBA1C 6.1 (H) 06/30/2022  Not on meds, she has lost weight, no other specific diet efforts Recheck labs

## 2023-04-28 NOTE — Assessment & Plan Note (Signed)
 Would still like to get her on ACEI/ARB, did not tolerate losartan  Recheck renal function

## 2023-04-28 NOTE — Assessment & Plan Note (Signed)
 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

## 2023-04-28 NOTE — Progress Notes (Signed)
 Susan Snyder,acting as a scribe for Rosina Riis, MD.,have documented all relevant documentation on the behalf of Rosina Riis, MD,as directed by  Rosina Riis, MD while in the presence of Rosina Riis, MD.  04/28/23 12:23 PM   Susan Snyder 07-21-1965 [id number]  Referring provider: Leavy Mole, PA-C [address] [place],  [place] [address]  Chief Complaint  Patient presents with   Follow-up    HPI: 58 year-old female who presents today rafter a recent ER visit. She was last seen by me in 2023.  She has a personal history of bladder cancer.  She is s/p TURBT and intravesical chemotherapy on 11/19/2019.  TURBT from 10/12/2019 noted a posterior wall tumor, a cluster of dome to anterior tumors. Initial surgical pathology revealed high-grade urothelial carcinoma with variant features, 50% giant cell features invasive into the lamina propria at minimum.  There was concomitant CIS.  Despite surgical resection being relatively deep into the fat, no muscularis propria is identified.  She did undergo a cystogram which was negative and catheter was subsequently removed.   She underwent another TURBT and intravesical chemotherapy on 11/19/2019. Papillary tumor on the posterior bladder wall, approximately 1 cm area of obvious residual tumor with surrounding erythema.  Extensive necrosis at previous resection bed at dome, anterior bladder wall.  Total of at least 3 cm x 3 cm area reresected extending from dome, posterior bladder wall to the right lateral bladder wall.   Pathology on 11/19/2019 noted high-grade urothelial carcinoma with invasion of lamina propria. Muscularis propria not identified. There was focal residual high-grade urothelial carcinoma with invasion of lamina propria. Urothelial carcinoma in situ. Muscularis propria was present and no invasion was identified; interpretation is limited due to extensive necrosis.    She underwent a robotic assisted complete  cystectomy ileal conduit and bilateral oophorectomy with Dr Alvaro on 02/20/2020. Terminal stellate ischemia which was managed conservatively. She had giant cell differentiation.    Her postoperative course has been complicated by distal stomal ischemia.  She is gained significant weight in the interim as well and had retraction of her stoma.  Given adequate drainage, no further intervention was recommended.  She was recently seen in the emergency room with decreased stoma output concerning for obstruction. She noted that this has happened before and spontaneously resolves. Ultimately, her urine started to drain, and her exam was reassuring. She was advised to call for urology.   She had a CT scan without contrast in the emergency room that I personally reviewed that showed mild bilateral hydronephrosis and some dilation of her ilial conduit on the abdominal side of her fascia with some beaking on the opening. She also had a recent chest XR that was negative.   We had advised her to set up a screening appointment for cancer surveillance, but she ultimately did not follow up with us .   She describes the stoma as retracting and narrowing, causing pain and a sensation of a 'brick' behind it. She has attempted various methods to alleviate the issue, including massage and increased fluid intake, but these have not been effective. She expresses frustration with lack of communication from previous providers and is seeking further evaluation and management.    PMH: Past Medical History:  Diagnosis Date   Abnormal finding on MRI of brain 12/28/2014   Sept 28, 2016 [facility]   Anemia    Basal cell carcinoma 2017   R med canthus - treated with Mohs in [facility]  cell carcinoma 11/26/2021   R hand dorsum - ED&C   Cancer (HCC)    bladder   CCC (chronic calculous cholecystitis) 04/03/2019   Chronic diarrhea    Chronic gastritis 11/2013   on EGD    Collagenous colitis    colonoscopy biopsies 11/01/13    Complication of anesthesia    DDD (degenerative disc disease), cervical    Frequent headaches 12/28/2014   GERD (gastroesophageal reflux disease)    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   History of kidney stones    History of recurrent UTIs 08/01/2019   Hx of iron deficiency anemia 01/14/2015   Hyperplastic polyps of stomach    Aug 2015   Hypertension    Neoplasm of uncertain behavior of skin 05/20/2016   Hx of skin cancer, BCC   Neoplasm of uncertain behavior of skin 05/20/2016   Hx of skin cancer, BCC   PONV (postoperative nausea and vomiting)    only with her hysterectomy   Presence of urostomy (HCC)    Primary localized osteoarthrosis, pelvic region and thigh 10/25/2013   Renal cyst    Tobacco abuse 12/23/2014    Surgical History: Past Surgical History:  Procedure Laterality Date   ABDOMINAL HYSTERECTOMY  2005   partial, still has ovaries, due to fibroids.   APPENDECTOMY     BACK SURGERY  1996, 1998, 1999   x 4   CHOLECYSTECTOMY N/A 04/11/2019   Procedure: LAPAROSCOPIC CHOLECYSTECTOMY;  Surgeon: Lane Shope, MD;  Location: [facility];  Service: General;  Laterality: N/A;   COLONOSCOPY W/ BIOPSIES  11/01/13   collagenous colitis, sigmoid polypectomy x 2; hyperplastic polyps   COLONOSCOPY WITH PROPOFOL  N/A 06/18/2021   Procedure: COLONOSCOPY WITH PROPOFOL ;  Surgeon: Jinny Carmine, MD;  Location: [facility];  Service: Endoscopy;  Laterality: N/A;   CYSTECTOMY N/A 02/22/2020   Procedure: ENDOSCOPY OF ILEAL CONDUIT;  Surgeon: Alvaro Hummer, MD;  Location: [facility];  Service: Urology;  Laterality: N/A;   CYSTOSCOPY WITH BIOPSY N/A 11/19/2019   Procedure: CYSTOSCOPY WITH BIOPSY;  Surgeon: Penne Knee, MD;  Location: [facility];  Service: Urology;  Laterality: N/A;   CYSTOSCOPY WITH INJECTION N/A 02/20/2020   Procedure: CYSTOSCOPY WITH INJECTION OF INDOCYANINE GREEN  DYE;  Surgeon: Alvaro Hummer, MD;  Location: [facility];  Service: Urology;  Laterality: N/A;    ESOPHAGOGASTRODUODENOSCOPY  11/22/13   chronic gastritis   HIP ARTHROPLASTY Bilateral 01/15/14,03/05/14   total hip, Dr. Glendia Sor   LAPAROSCOPIC APPENDECTOMY N/A 08/20/2018   Procedure: APPENDECTOMY LAPAROSCOPIC;  Surgeon: Marolyn Nest, MD;  Location: [facility];  Service: General;  Laterality: N/A;   LYMPHADENECTOMY Bilateral 02/20/2020   Procedure: REDGIE;  Surgeon: Alvaro Hummer, MD;  Location: [facility];  Service: Urology;  Laterality: Bilateral;   NECK SURGERY  2015   POLYPECTOMY  06/18/2021   Procedure: POLYPECTOMY;  Surgeon: Jinny Carmine, MD;  Location: [facility];  Service: Endoscopy;;   ROBOT ASSISTED LAPAROSCOPIC COMPLETE CYSTECT ILEAL CONDUIT N/A 02/20/2020   Procedure: XI ROBOTIC ASSISTED LAPAROSCOPIC COMPLETE CYSTECT ILEAL CONDUIT AND BILATERAL OOPHORECTOMY;  Surgeon: Alvaro Hummer, MD;  Location: [facility];  Service: Urology;  Laterality: N/A;  6 HRS   TRANSURETHRAL RESECTION OF BLADDER TUMOR WITH MITOMYCIN -C N/A 10/29/2019   Procedure: TRANSURETHRAL RESECTION OF BLADDER TUMOR;  Surgeon: Penne Knee, MD;  Location: [facility];  Service: Urology;  Laterality: N/A;   TRANSURETHRAL RESECTION OF BLADDER TUMOR WITH MITOMYCIN -C N/A 11/19/2019   Procedure: TRANSURETHRAL RESECTION OF BLADDER TUMOR WITH Gemcitabine ;  Surgeon: Penne Knee, MD;  Location:  [facility];  Service: Urology;  Laterality: N/A;    Home Medications:  Allergies as of 04/28/2023       Reactions   Adhesive [tape] Other (See Comments)   Blisters   Gabapentin Nausea And Vomiting   Hydrocodone -acetaminophen  Nausea And Vomiting   hallucinations   Keflex [cephalexin] Shortness Of Breath, Palpitations   Sulfa Antibiotics Shortness Of Breath, Palpitations   Penicillins Rash   Did it involve swelling of the face/tongue/throat, SOB, or low BP? No Did it involve sudden or severe rash/hives, skin peeling, or any reaction on the inside of your mouth or nose? No Did you need to seek medical attention at a  hospital or doctor's office? No When did it last happen?      Childhood If all above answers are "NO", [place] proceed with cephalosporin use.        Medication List        Accurate as of April 28, 2023 12:23 PM. If you have any questions, ask your nurse or doctor.          STOP taking these medications    Baclofen  5 MG Tabs       TAKE these medications    aspirin  EC 81 MG tablet Take 1 tablet (81 mg total) by mouth daily.   atorvastatin  10 MG tablet Commonly known as: LIPITOR Take 1 tablet (10 mg total) by mouth at bedtime.   B-12 1000 MCG Subl Place under the tongue daily.   buPROPion  150 MG 24 hr tablet Commonly known as: Wellbutrin  XL Take 1 tablet (150 mg total) by mouth in the morning.   pantoprazole  40 MG tablet Commonly known as: PROTONIX  TAKE 1 TABLET BY MOUTH EVERY DAY   VITAMIN D  PO Take by mouth.        Allergies:  Allergies  Allergen Reactions   Adhesive [Tape] Other (See Comments)    Blisters   Gabapentin Nausea And Vomiting   Hydrocodone -Acetaminophen  Nausea And Vomiting    hallucinations   Keflex [Cephalexin] Shortness Of Breath and Palpitations   Sulfa Antibiotics Shortness Of Breath and Palpitations   Penicillins Rash    Did it involve swelling of the face/tongue/throat, SOB, or low BP? No Did it involve sudden or severe rash/hives, skin peeling, or any reaction on the inside of your mouth or nose? No Did you need to seek medical attention at a hospital or doctor's office? No When did it last happen?      Childhood If all above answers are "NO", [place] proceed with cephalosporin use.     Family History: Family History  Problem Relation Age of Onset   Stroke Mother    Glaucoma Mother    Heart disease Father    Cancer Father        prostate   Heart disease Sister    Diabetes Sister    Heart disease Sister        pace maker   Diabetes Paternal Aunt    Heart disease Paternal Aunt    Diabetes Paternal Aunt    Heart disease  Paternal Aunt    Bleeding Disorder Paternal Uncle    Breast cancer Neg Hx     Social History:  reports that she quit smoking about 3 years ago. Her smoking use included cigarettes. She started smoking about 33 years ago. She has a 30 pack-year smoking history. She has never used smokeless tobacco. She reports that she does not currently use alcohol. She reports that she does not use  drugs.   Physical Exam:  We did remove her stoma bag, and her stoma was completely retracted and the opening was actually not even visible. I was able to find the opening and advance a sensor wire through it, at which time a good amount of urine drained around. I used the introducer of the sensor wire to further dilate it, gently just within the os itself, taking care not to advance it too much.  We tried to catheterize it with a 14 french red rubber, which did not go. BP (!) 155/89   Pulse (!) 109   Ht 5' 9 (1.753 m)   Wt 207 lb 4 oz (94 kg)   BMI 30.61 kg/m   Constitutional:  Alert and oriented, No acute distress. HEENT: [place] AT, moist mucus membranes.  Trachea midline, no masses. Abdomen: Pinpoint opening, dilated slightly today but still quite stenotic Neurologic: Grossly intact, no focal deficits, moving all 4 extremities. Psychiatric: Normal mood and affect.  Pertinent Imaging: EXAM: CT ABDOMEN AND PELVIS WITH CONTRAST   TECHNIQUE: Multidetector CT imaging of the abdomen and pelvis was performed using the standard protocol following bolus administration of intravenous contrast.   RADIATION DOSE REDUCTION: This exam was performed according to the departmental dose-optimization program which includes automated exposure control, adjustment of the mA and/or kV according to patient size and/or use of iterative reconstruction technique.   CONTRAST:  OMNIPAQUE  IOHEXOL  350 MG/ML SOLN   COMPARISON:  12/29/2022   FINDINGS: Lower chest: No acute abnormality   Hepatobiliary: No focal liver  abnormality is seen. Status post cholecystectomy. No biliary dilatation.   Pancreas: No focal abnormality or ductal dilatation.   Spleen: No focal abnormality.  Normal size.   Adrenals/Urinary Tract: Prior cystectomy. Adrenal glands normal. Mild bilateral hydronephrosis. Ileal conduit noted in the right abdomen.   Stomach/Bowel: Stomach, large and small bowel grossly unremarkable.   Vascular/Lymphatic: No evidence of aneurysm or adenopathy.   Reproductive:  Prior hysterectomy.  No adnexal masses.   Other: No free fluid or free air.   Musculoskeletal: Bilateral hip replacements. No acute bony abnormality.   IMPRESSION: Prior cystectomy with ileal conduit. Mild bilateral hydronephrosis, increasing since prior study.   Aortic atherosclerosis.     Electronically Signed   By: Franky Crease M.D.   On: 03/29/2023 20:07  This was personally reviewed and I agree with the radiologic interpretation.  There is some beaking of the ileal conduit within the abdominal wall to the level of the skin which appears slightly more dilated than would be expected concerning for stenosis and incomplete drainage.   Assessment & Plan:    1. Ileal conduit stomal stenosis - Exam shows a pinpoint opening, dilated slightly today but still quite stenotic - I reached out to Dr. Alvaro personally and discussed this case. He will arrange follow up in his office as soon as possible to discuss dilation versus revision - In the interim, warnings symptoms were reviewed - Unfortunately, the stoma is so stenotic that red rubber self-catheterization is not likely going to be possible.  - If she develops severe pain again, she should return to the emergency room or call the office ASAP  Return for further evaluation by Dr. Haroldine office as soon as possible for further management.  I have reviewed the above documentation for accuracy and completeness, and I agree with the above.   Rosina Riis,  MD   [facility] [address], [address] [place], [place] [address] [phone]

## 2023-04-28 NOTE — Assessment & Plan Note (Signed)
 Recheck, not on supplement

## 2023-04-28 NOTE — Assessment & Plan Note (Signed)
 Stopped meds a few weeks ago, due for lipids Ne[place]yer adjust meds pending results  She has tolerated statins in the past w/o SE or concerns

## 2023-04-28 NOTE — Assessment & Plan Note (Addendum)
 Near goal today while off metoprolol  04/27/23 136/78  03/29/23 (!) 121/101  06/30/22 124/76  06/14/22 128/84  Pt can stay off BB and continue to monitor BP, gaol to have <130/80 Diet/lifestyle efforts encouraged as able, if not at goal then I would still recommend trying different med for management of HTN other than BB

## 2023-04-28 NOTE — Assessment & Plan Note (Signed)
 Not currently managed by GI- lost to f/up and insurance hasn't covered recommended tx

## 2023-05-23 ENCOUNTER — Ambulatory Visit (INDEPENDENT_AMBULATORY_CARE_PROVIDER_SITE_OTHER): Payer: 59 | Admitting: Dermatology

## 2023-05-23 ENCOUNTER — Encounter: Payer: Self-pay | Admitting: Dermatology

## 2023-05-23 DIAGNOSIS — Z85828 Personal history of other malignant neoplasm of skin: Secondary | ICD-10-CM | POA: Diagnosis not present

## 2023-05-23 DIAGNOSIS — L578 Other skin changes due to chronic exposure to nonionizing radiation: Secondary | ICD-10-CM

## 2023-05-23 DIAGNOSIS — Z1283 Encounter for screening for malignant neoplasm of skin: Secondary | ICD-10-CM

## 2023-05-23 DIAGNOSIS — W908XXA Exposure to other nonionizing radiation, initial encounter: Secondary | ICD-10-CM | POA: Diagnosis not present

## 2023-05-23 DIAGNOSIS — L570 Actinic keratosis: Secondary | ICD-10-CM | POA: Diagnosis not present

## 2023-05-23 DIAGNOSIS — D229 Melanocytic nevi, unspecified: Secondary | ICD-10-CM

## 2023-05-23 DIAGNOSIS — D1801 Hemangioma of skin and subcutaneous tissue: Secondary | ICD-10-CM

## 2023-05-23 DIAGNOSIS — L814 Other melanin hyperpigmentation: Secondary | ICD-10-CM | POA: Diagnosis not present

## 2023-05-23 DIAGNOSIS — L821 Other seborrheic keratosis: Secondary | ICD-10-CM | POA: Diagnosis not present

## 2023-05-23 DIAGNOSIS — L820 Inflamed seborrheic keratosis: Secondary | ICD-10-CM

## 2023-05-23 NOTE — Patient Instructions (Addendum)

## 2023-05-23 NOTE — Progress Notes (Addendum)
 Follow-Up Visit   Subjective  Susan Snyder is a 58 y.o. female who presents for the following: Skin Cancer Screening and Full Body Skin Exam. Hx of BCCs.  Areas of concern on back, right side of face and left hand. Itching.   The patient presents for Total-Body Skin Exam (TBSE) for skin cancer screening and mole check. The patient has spots, moles and lesions to be evaluated, some Mahoney be new or changing and the patient Trippett have concern these could be cancer.    The following portions of the chart were reviewed this encounter and updated as appropriate: medications, allergies, medical history  Review of Systems:  No other skin or systemic complaints except as noted in HPI or Assessment and Plan.  Objective  Well appearing patient in no apparent distress; mood and affect are within normal limits.  A full examination was performed including scalp, head, eyes, ears, nose, lips, neck, chest, axillae, abdomen, back, buttocks, bilateral upper extremities, bilateral lower extremities, hands, feet, fingers, toes, fingernails, and toenails. All findings within normal limits unless otherwise noted below.   Relevant physical exam findings are noted in the Assessment and Plan.  Exam of nails limited by presence of nail polish.      Right Jaw Line x1, central upper forehead x1 (2) Erythematous thin papules/macules with gritty scale.  Left Dorsal Hand x1, Back x4, left superior calf below popliteal x1 (6) Erythematous keratotic or waxy stuck-on papule or plaque.  Assessment & Plan   SKIN CANCER SCREENING PERFORMED TODAY.  HISTORY OF BASAL CELL CARCINOMA OF THE SKIN. Right medial canthus, Mohs 2017. Right hand dorsum, [facility] 11/26/2021. - No evidence of recurrence today - Recommend regular full body skin exams - Recommend daily broad spectrum sunscreen SPF 30+ to sun-exposed areas, reapply every 2 hours as needed.  - Call if any new or changing lesions are noted between office  visits   ACTINIC DAMAGE - Chronic condition, secondary to cumulative UV/sun exposure - diffuse scaly erythematous macules with underlying dyspigmentation - Recommend daily broad spectrum sunscreen SPF 30+ to sun-exposed areas, reapply every 2 hours as needed.  - Staying in the shade or wearing long sleeves, sun glasses (UVA+UVB protection) and wide brim hats (4-inch brim around the entire circumference of the hat) are also recommended for sun protection.  - Call for new or changing lesions.  LENTIGINES, SEBORRHEIC KERATOSES, HEMANGIOMAS - Benign normal skin lesions - Benign-appearing - Call for any changes  MELANOCYTIC NEVI - Tan-brown and/or pink-flesh-colored symmetric macules and papules - Benign appearing on exam today - Observation - Call clinic for new or changing moles - Recommend daily use of broad spectrum spf 30+ sunscreen to sun-exposed areas.  - Check toenails when remove polish.      AK (ACTINIC KERATOSIS) (2) Right Jaw Line x1, central upper forehead x1 (2) Actinic keratoses are precancerous spots that appear secondary to cumulative UV radiation exposure/sun exposure over time. They are chronic with expected duration over 1 year. A portion of actinic keratoses will progress to squamous cell carcinoma of the skin. It is not possible to reliably predict which spots will progress to skin cancer and so treatment is recommended to prevent development of skin cancer.  Recommend daily broad spectrum sunscreen SPF 30+ to sun-exposed areas, reapply every 2 hours as needed.  Recommend staying in the shade or wearing long sleeves, sun glasses (UVA+UVB protection) and wide brim hats (4-inch brim around the entire circumference of the hat). Call for new or changing  lesions. Destruction of lesion - Right Jaw Line x1, central upper forehead x1 (2) Complexity: simple   Destruction method: cryotherapy   Informed consent: discussed and consent obtained   Timeout:  patient name, date  of birth, surgical site, and procedure verified Lesion destroyed using liquid nitrogen: Yes   Region frozen until ice ball extended beyond lesion: Yes   Cryo cycles: 1 or 2. Outcome: patient tolerated procedure well with no complications   Post-procedure details: wound care instructions given   Additional details:  Prior to procedure, discussed risks of blister formation, small wound, skin dyspigmentation, or rare scar following cryotherapy. Recommend Vaseline ointment to treated areas while healing.  INFLAMED SEBORRHEIC KERATOSIS (6) Left Dorsal Hand x1, Back x4, left superior calf below popliteal x1 (6) Symptomatic, irritating, patient would like treated.  Left calf, if not resolved in 1 month RTC for biopsy.  Destruction of lesion - Left Dorsal Hand x1, Back x4, left superior calf below popliteal x1 (6) Complexity: simple   Destruction method: cryotherapy   Informed consent: discussed and consent obtained   Timeout:  patient name, date of birth, surgical site, and procedure verified Lesion destroyed using liquid nitrogen: Yes   Region frozen until ice ball extended beyond lesion: Yes   Cryo cycles: 1 or 2. Outcome: patient tolerated procedure well with no complications   Post-procedure details: wound care instructions given   Additional details:  Prior to procedure, discussed risks of blister formation, small wound, skin dyspigmentation, or rare scar following cryotherapy. Recommend Vaseline ointment to treated areas while healing.  MULTIPLE BENIGN NEVI   LENTIGINES   ACTINIC ELASTOSIS   SEBORRHEIC KERATOSES   CHERRY ANGIOMA    Return in about 1 year (around 05/22/2024) for TBSE, HxBCCs.  I, Lawson Radar, CMA, am acting as scribe for Elie Goody, MD.   Documentation: I have reviewed the above documentation for accuracy and completeness, and I agree with the above.  Elie Goody, MD

## 2023-06-04 ENCOUNTER — Other Ambulatory Visit: Payer: Self-pay | Admitting: Family Medicine

## 2023-06-04 DIAGNOSIS — R109 Unspecified abdominal pain: Secondary | ICD-10-CM

## 2023-06-21 ENCOUNTER — Encounter: Payer: Self-pay | Admitting: Dermatology

## 2023-06-21 ENCOUNTER — Other Ambulatory Visit: Payer: Self-pay | Admitting: Urology

## 2023-07-12 NOTE — Progress Notes (Signed)
 Surgery orders requested with Zona at Dr. Ethelyn Herbert office.

## 2023-07-14 ENCOUNTER — Other Ambulatory Visit: Payer: Self-pay | Admitting: Urology

## 2023-07-18 NOTE — Progress Notes (Signed)
 COVID Vaccine received:  []  No [x]  Yes Date of any COVID positive Test in last 90 days: no PCP - Adeline Hone PA-C Cardiologist - n/a  Chest x-ray - 12/29/22 Epic EKG -   Stress Test -  ECHO -  Cardiac Cath -   Bowel Prep - [x]  No  []   Yes ______  Pacemaker / ICD device [x]  No []  Yes   Spinal Cord Stimulator:[x]  No []  Yes       History of Sleep Apnea? [x]  No []  Yes   CPAP used?- [x]  No []  Yes    Does the patient monitor blood sugar?          [x]  No []  Yes  []  N/A  Patient has: [x]  NO Hx DM   []  Pre-DM                 []  DM1  []   DM2 Does patient have a [employer] or Dexacom? []  No []  Yes   Fasting Blood Sugar Ranges-  Checks Blood Sugar _____ times a day  GLP1 agonist / usual dose - no GLP1 instructions:  SGLT-2 inhibitors / usual dose - no SGLT-2 instructions:   Blood Thinner / Instructions:no Aspirin  Instructions:ASA 81 mg Instructed to stop 5-7 days prior to surgery.  Comments:   Activity level: Patient is able to climb a flight of stairs without difficulty; [x]  No CP  []  No SOB,Patient can perform ADLs without assistance.   Anesthesia review:   Patient denies shortness of breath, fever, cough and chest pain at PAT appointment.  Patient verbalized understanding and agreement to the Pre-Surgical Instructions that were given to them at this PAT appointment. Patient was also educated of the need to review these PAT instructions again prior to his/her surgery.I reviewed the appropriate phone numbers to call if they have any and questions or concerns.

## 2023-07-18 NOTE — Patient Instructions (Signed)
 SURGICAL WAITING ROOM VISITATION  Patients having surgery or a procedure D[place]mer have no more than 2 support people in the waiting area - these visitors Gouge rotate.    Children under the age of 38 must have an adult with them who is not the patient.  Due to an increase in RSV and influenza rates and associated hospitalizations, children ages 32 and under [place] not visit patients in [facility] hospitals.  Visitors with respiratory illnesses are discouraged from visiting and should remain at home.  If the patient needs to stay at the hospital during part of their recovery, the visitor guidelines for inpatient rooms apply. Pre-op nurse will coordinate an appropriate time for 1 support person to accompany patient in pre-op.  This support person Cordy not rotate.    Please refer to the [facility] website for the visitor guidelines for Inpatients (after your surgery is over and you are in a regular room).       Your procedure is scheduled on: 07/29/23   Report to [facility] Main Entrance    Report to admitting at 8:45 AM   Call this number if you have problems the morning of surgery [phone]   Do not eat food or drink liquids:After Midnight. But Reisig have sips of water  with meds.       FOLLOW BOWEL PREP AND ANY ADDITIONAL PRE OP INSTRUCTIONS YOU RECEIVED FROM YOUR SURGEON'S OFFICE!!!     Oral Hygiene is also important to reduce your risk of infection.                                    Remember - BRUSH YOUR TEETH THE MORNING OF SURGERY WITH YOUR REGULAR TOOTHPASTE   Stop all vitamins and herbal supplements 7 days before surgery.   Take these medicines the morning of surgery with A SIP OF WATER : wellbutrin , pantoprazole (Protonix ), Rosuvastatin (crestor ), tylenol  if needed.  DO NOT TAKE ANY ORAL DIABETIC MEDICATIONS DAY OF YOUR SURGERY             You J[place]ow not have any metal on your body including hair pins, jewelry, and body piercing             Do not wear make-up, lotions,  powders, perfumes/cologne, or deodorant  Do not wear nail polish including gel and S&S, artificial/acrylic nails, or any other type of covering on natural nails including finger and toenails. If you have artificial nails, gel coating, etc. that needs to be removed by a nail salon please have this removed prior to surgery or surgery Du[place]an need to be canceled/ delayed if the surgeon/ anesthesia feels like they are unable to be safely monitored.   Do not shave  48 hours prior to surgery.    Do not bring valuables to the hospital. [facility] IS NOT             RESPONSIBLE   FOR VALUABLES.   Contacts, glasses, dentures or bridgework Hankerson not be worn into surgery.   Bring small overnight bag day of surgery.   DO NOT BRING YOUR HOME MEDICATIONS TO THE HOSPITAL. PHARMACY WILL DISPENSE MEDICATIONS LISTED ON YOUR MEDICATION LIST TO YOU DURING YOUR ADMISSION IN THE HOSPITAL!    Patients discharged on the day of surgery will not be allowed to drive home.  Someone NEEDS to stay with you for the first 24 hours after anesthesia.   Special Instructions: Bring a copy  of your healthcare power of attorney and living will documents the day of surgery if you haven't scanned them before.              Please read over the following fact sheets you were given: IF YOU HAVE QUESTIONS ABOUT YOUR PRE-OP INSTRUCTIONS PLEASE CALL [phone] Ammon Bales   If you received a COVID test during your pre-op visit  it is requested that you wear a mask when out in public, stay away from anyone that Huizar not be feeling well and notify your surgeon if you develop symptoms. If you test positive for Covid or have been in contact with anyone that has tested positive in the last 10 days please notify you surgeon.    [place] - Preparing for Surgery Before surgery, you can play an important role.  Because skin is not sterile, your skin needs to be as free of germs as possible.  You can reduce the number of germs on your skin by washing  with CHG (chlorahexidine gluconate) soap before surgery.  CHG is an antiseptic cleaner which kills germs and bonds with the skin to continue killing germs even after washing. Please DO NOT use if you have an allergy to CHG or antibacterial soaps.  If your skin becomes reddened/irritated stop using the CHG and inform your nurse when you arrive at Short Stay. Do not shave (including legs and underarms) for at least 48 hours prior to the first CHG shower.  You Shimamoto shave your face/neck.  Please follow these instructions carefully:  1.  Shower with CHG Soap the night before surgery and the  morning of surgery.  2.  If you choose to wash your hair, wash your hair first as usual with your normal  shampoo.  3.  After you shampoo, rinse your hair and body thoroughly to remove the shampoo.                             4.  Use CHG as you would any other liquid soap.  You can apply chg directly to the skin and wash.  Gently with a scrungie or clean washcloth.  5.  Apply the CHG Soap to your body ONLY FROM THE NECK DOWN.   Do   not use on face/ open                           Wound or open sores. Avoid contact with eyes, ears mouth and   genitals (private parts).                       Wash face,  Genitals (private parts) with your normal soap.             6.  Wash thoroughly, paying special attention to the area where your    surgery  will be performed.  7.  Thoroughly rinse your body with warm water  from the neck down.  8.  DO NOT shower/wash with your normal soap after using and rinsing off the CHG Soap.                9.  Pat yourself dry with a clean towel.            10.  Wear clean pajamas.            11.  Place clean sheets on your bed the night  of your first shower and do not  sleep with pets. Day of Surgery : Do not apply any lotions/deodorants the morning of surgery.  Please wear clean clothes to the hospital/surgery center.  FAILURE TO FOLLOW THESE INSTRUCTIONS Xia RESULT IN THE CANCELLATION OF YOUR  SURGERY  PATIENT SIGNATURE_________________________________  NURSE SIGNATURE__________________________________  ________________________________________________________________________

## 2023-07-19 ENCOUNTER — Other Ambulatory Visit: Payer: Self-pay

## 2023-07-19 ENCOUNTER — Encounter (HOSPITAL_COMMUNITY)
Admission: RE | Admit: 2023-07-19 | Discharge: 2023-07-19 | Disposition: A | Discharge status: Home / Self Care | Source: Ambulatory Visit | Attending: Urology | Admitting: Urology

## 2023-07-19 ENCOUNTER — Encounter (HOSPITAL_COMMUNITY): Payer: Self-pay

## 2023-07-19 VITALS — BP 150/90 | HR 86 | Temp 98.2°F | Resp 16 | Ht 68.5 in | Wt 202.0 lb

## 2023-07-19 DIAGNOSIS — I10 Essential (primary) hypertension: Secondary | ICD-10-CM | POA: Diagnosis not present

## 2023-07-19 DIAGNOSIS — Z01818 Encounter for other preprocedural examination: Secondary | ICD-10-CM | POA: Insufficient documentation

## 2023-07-19 DIAGNOSIS — A4153 Sepsis due to Serratia: Secondary | ICD-10-CM | POA: Diagnosis not present

## 2023-07-19 DIAGNOSIS — N390 Urinary tract infection, site not specified: Secondary | ICD-10-CM | POA: Diagnosis not present

## 2023-07-19 HISTORY — DX: Unspecified osteoarthritis, unspecified site: M19.90

## 2023-07-19 HISTORY — DX: Anxiety disorder, unspecified: F41.9

## 2023-07-19 LAB — BASIC METABOLIC PANEL WITH GFR
Anion gap: 6 (ref 5–15)
BUN: 15 mg/dL (ref 6–20)
CO2: 26 mmol/L (ref 22–32)
Calcium: 9.1 mg/dL (ref 8.9–10.3)
Chloride: 109 mmol/L (ref 98–111)
Creatinine, Ser: 0.87 mg/dL (ref 0.44–1.00)
GFR, Estimated: >60 mL/min (ref >60)
Glucose, Bld: 90 mg/dL (ref 70–99)
Potassium: 3.8 mmol/L (ref 3.5–5.1)
Sodium: 141 mmol/L (ref 135–145)

## 2023-07-19 LAB — CBC
HCT: 41.9 % (ref 36.0–46.0)
Hemoglobin: 13.2 g/dL (ref 12.0–15.0)
MCH: 29.8 pg (ref 26.0–34.0)
MCHC: 31.5 g/dL (ref 30.0–36.0)
MCV: 94.6 fL (ref 80.0–100.0)
Platelets: 297 10*3/uL (ref 150–400)
RBC: 4.43 MIL/uL (ref 3.87–5.11)
RDW: 14.6 % (ref 11.5–15.5)
WBC: 8.9 10*3/uL (ref 4.0–10.5)
nRBC: 0 % (ref 0.0–0.2)

## 2023-07-28 MED ORDER — GENTAMICIN SULFATE 40 MG/ML IJ SOLN
5.0000 mg/kg | INTRAVENOUS | Status: AC
  Administered 2023-07-29: 380 mg via INTRAVENOUS
  Filled 2023-07-28: qty 9.5

## 2023-07-29 ENCOUNTER — Encounter (HOSPITAL_COMMUNITY)
Admission: RE | Disposition: A | Discharge status: Home / Self Care | Payer: Self-pay | Source: Ambulatory Visit | Attending: Urology

## 2023-07-29 ENCOUNTER — Inpatient Hospital Stay (HOSPITAL_COMMUNITY): Admitting: Anesthesiology

## 2023-07-29 ENCOUNTER — Encounter (HOSPITAL_COMMUNITY): Payer: Self-pay | Admitting: Urology

## 2023-07-29 ENCOUNTER — Observation Stay (HOSPITAL_COMMUNITY)
Admission: RE | Admit: 2023-07-29 | Discharge: 2023-07-30 | Disposition: A | Discharge status: Home / Self Care | Source: Ambulatory Visit | Attending: Urology | Admitting: Urology

## 2023-07-29 ENCOUNTER — Inpatient Hospital Stay (HOSPITAL_BASED_OUTPATIENT_CLINIC_OR_DEPARTMENT_OTHER): Admitting: Anesthesiology

## 2023-07-29 ENCOUNTER — Other Ambulatory Visit: Payer: Self-pay

## 2023-07-29 DIAGNOSIS — Y828 Other medical devices associated with adverse incidents: Secondary | ICD-10-CM | POA: Diagnosis not present

## 2023-07-29 DIAGNOSIS — I10 Essential (primary) hypertension: Secondary | ICD-10-CM | POA: Diagnosis not present

## 2023-07-29 DIAGNOSIS — T85858A Stenosis due to other internal prosthetic devices, implants and grafts, initial encounter: Principal | ICD-10-CM | POA: Diagnosis present

## 2023-07-29 DIAGNOSIS — F17210 Nicotine dependence, cigarettes, uncomplicated: Secondary | ICD-10-CM | POA: Insufficient documentation

## 2023-07-29 DIAGNOSIS — Z85828 Personal history of other malignant neoplasm of skin: Secondary | ICD-10-CM | POA: Insufficient documentation

## 2023-07-29 DIAGNOSIS — T83193D Other mechanical complication of other urinary stent, subsequent encounter: Secondary | ICD-10-CM | POA: Diagnosis not present

## 2023-07-29 DIAGNOSIS — Z96643 Presence of artificial hip joint, bilateral: Secondary | ICD-10-CM | POA: Diagnosis not present

## 2023-07-29 DIAGNOSIS — N99524 Stenosis of incontinent stoma of urinary tract: Secondary | ICD-10-CM

## 2023-07-29 DIAGNOSIS — Z8551 Personal history of malignant neoplasm of bladder: Secondary | ICD-10-CM | POA: Diagnosis not present

## 2023-07-29 DIAGNOSIS — T8385XA Stenosis of genitourinary prosthetic devices, implants and grafts, initial encounter: Principal | ICD-10-CM | POA: Insufficient documentation

## 2023-07-29 DIAGNOSIS — Z932 Ileostomy status: Secondary | ICD-10-CM | POA: Diagnosis not present

## 2023-07-29 DIAGNOSIS — C672 Malignant neoplasm of lateral wall of bladder: Secondary | ICD-10-CM | POA: Diagnosis present

## 2023-07-29 DIAGNOSIS — Z936 Other artificial openings of urinary tract status: Secondary | ICD-10-CM | POA: Diagnosis not present

## 2023-07-29 HISTORY — PX: REVISION, URINARY CONDUIT: SHX7642

## 2023-07-29 SURGERY — REVISION, URINARY CONDUIT
Anesthesia: General

## 2023-07-29 MED ORDER — SODIUM CHLORIDE 0.9 % IV SOLN: INTRAVENOUS | Status: DC

## 2023-07-29 MED ORDER — ROCURONIUM BROMIDE 10 MG/ML (PF) SYRINGE
PREFILLED_SYRINGE | INTRAVENOUS | Status: AC
  Filled 2023-07-29: qty 10

## 2023-07-29 MED ORDER — HYDROMORPHONE HCL 1 MG/ML IJ SOLN
0.2500 mg | INTRAMUSCULAR | Status: DC | PRN
  Administered 2023-07-29 (×2): 0.5 mg via INTRAVENOUS

## 2023-07-29 MED ORDER — CHLORHEXIDINE GLUCONATE 0.12 % MT SOLN
15.0000 mL | Freq: Once | OROMUCOSAL | Status: AC
  Administered 2023-07-29: 15 mL via OROMUCOSAL

## 2023-07-29 MED ORDER — PANTOPRAZOLE SODIUM 40 MG PO TBEC
40.0000 mg | DELAYED_RELEASE_TABLET | Freq: Every day | ORAL | Status: DC
  Administered 2023-07-30: 40 mg via ORAL
  Filled 2023-07-29: qty 1

## 2023-07-29 MED ORDER — DEXAMETHASONE SODIUM PHOSPHATE 10 MG/ML IJ SOLN
INTRAMUSCULAR | Status: DC | PRN
  Administered 2023-07-29: 8 mg via INTRAVENOUS

## 2023-07-29 MED ORDER — ONDANSETRON HCL 4 MG/2ML IJ SOLN: 4.0000 mg | INTRAMUSCULAR | Status: DC | PRN

## 2023-07-29 MED ORDER — FENTANYL CITRATE (PF) 100 MCG/2ML IJ SOLN
INTRAMUSCULAR | Status: AC
  Filled 2023-07-29: qty 2

## 2023-07-29 MED ORDER — SODIUM CHLORIDE (PF) 0.9 % IJ SOLN
INTRAMUSCULAR | Status: AC
  Filled 2023-07-29: qty 20

## 2023-07-29 MED ORDER — LIDOCAINE HCL (PF) 2 % IJ SOLN
INTRAMUSCULAR | Status: AC
  Filled 2023-07-29: qty 5

## 2023-07-29 MED ORDER — ASPIRIN 81 MG PO TBEC
81.0000 mg | DELAYED_RELEASE_TABLET | Freq: Every day | ORAL | Status: DC
  Administered 2023-07-29 – 2023-07-30 (×2): 81 mg via ORAL
  Filled 2023-07-29 (×2): qty 1

## 2023-07-29 MED ORDER — ROCURONIUM BROMIDE 100 MG/10ML IV SOLN
INTRAVENOUS | Status: DC | PRN
  Administered 2023-07-29: 60 mg via INTRAVENOUS

## 2023-07-29 MED ORDER — ACETAMINOPHEN 500 MG PO TABS
1000.0000 mg | ORAL_TABLET | Freq: Once | ORAL | Status: AC
  Administered 2023-07-29: 1000 mg via ORAL
  Filled 2023-07-29: qty 2

## 2023-07-29 MED ORDER — BUPROPION HCL ER (XL) 150 MG PO TB24
150.0000 mg | ORAL_TABLET | Freq: Every day | ORAL | Status: DC
  Administered 2023-07-30: 150 mg via ORAL
  Filled 2023-07-29: qty 1

## 2023-07-29 MED ORDER — PHENYLEPHRINE 80 MCG/ML (10ML) SYRINGE FOR IV PUSH (FOR BLOOD PRESSURE SUPPORT)
PREFILLED_SYRINGE | INTRAVENOUS | Status: AC
  Filled 2023-07-29: qty 10

## 2023-07-29 MED ORDER — ONDANSETRON HCL 4 MG/2ML IJ SOLN
INTRAMUSCULAR | Status: AC
  Filled 2023-07-29: qty 2

## 2023-07-29 MED ORDER — DOCUSATE SODIUM 100 MG PO CAPS
100.0000 mg | ORAL_CAPSULE | Freq: Two times a day (BID) | ORAL | Status: AC
Start: 2023-07-29 — End: ?

## 2023-07-29 MED ORDER — TRAMADOL HCL 50 MG PO TABS: 50.0000 mg | ORAL_TABLET | Freq: Four times a day (QID) | ORAL | 0 refills | Status: AC | PRN

## 2023-07-29 MED ORDER — LACTATED RINGERS IV SOLN: INTRAVENOUS | Status: DC

## 2023-07-29 MED ORDER — ONDANSETRON HCL 4 MG/2ML IJ SOLN
INTRAMUSCULAR | Status: DC | PRN
  Administered 2023-07-29: 4 mg via INTRAVENOUS

## 2023-07-29 MED ORDER — BUPIVACAINE LIPOSOME 1.3 % IJ SUSP
INTRAMUSCULAR | Status: AC
  Filled 2023-07-29: qty 20

## 2023-07-29 MED ORDER — KETAMINE HCL 10 MG/ML IJ SOLN
INTRAMUSCULAR | Status: DC | PRN
  Administered 2023-07-29: 30 mg via INTRAVENOUS

## 2023-07-29 MED ORDER — SUGAMMADEX SODIUM 200 MG/2ML IV SOLN
INTRAVENOUS | Status: DC | PRN
  Administered 2023-07-29: 200 mg via INTRAVENOUS

## 2023-07-29 MED ORDER — MIDAZOLAM HCL 5 MG/5ML IJ SOLN
INTRAMUSCULAR | Status: DC | PRN
  Administered 2023-07-29: 2 mg via INTRAVENOUS

## 2023-07-29 MED ORDER — MIDAZOLAM HCL 2 MG/2ML IJ SOLN
INTRAMUSCULAR | Status: AC
  Filled 2023-07-29: qty 2

## 2023-07-29 MED ORDER — 0.9 % SODIUM CHLORIDE (POUR BTL) OPTIME
TOPICAL | Status: DC | PRN
  Administered 2023-07-29: 2000 mL

## 2023-07-29 MED ORDER — ROSUVASTATIN CALCIUM 10 MG PO TABS
10.0000 mg | ORAL_TABLET | Freq: Every day | ORAL | Status: DC
  Administered 2023-07-30: 10 mg via ORAL
  Filled 2023-07-29: qty 1

## 2023-07-29 MED ORDER — DEXAMETHASONE SODIUM PHOSPHATE 10 MG/ML IJ SOLN
INTRAMUSCULAR | Status: AC
Start: 2023-07-29 — End: ?
  Filled 2023-07-29: qty 1

## 2023-07-29 MED ORDER — DIPHENHYDRAMINE HCL 50 MG/ML IJ SOLN: 12.5000 mg | Freq: Four times a day (QID) | INTRAMUSCULAR | Status: DC | PRN

## 2023-07-29 MED ORDER — ORAL CARE MOUTH RINSE: 15.0000 mL | Freq: Once | OROMUCOSAL | Status: AC

## 2023-07-29 MED ORDER — ACETAMINOPHEN 500 MG PO TABS
1000.0000 mg | ORAL_TABLET | Freq: Four times a day (QID) | ORAL | Status: DC
  Administered 2023-07-29 – 2023-07-30 (×3): 1000 mg via ORAL
  Filled 2023-07-29 (×3): qty 2

## 2023-07-29 MED ORDER — LIDOCAINE HCL (CARDIAC) PF 100 MG/5ML IV SOSY
PREFILLED_SYRINGE | INTRAVENOUS | Status: DC | PRN
  Administered 2023-07-29: 80 mg via INTRAVENOUS

## 2023-07-29 MED ORDER — PROPOFOL 1000 MG/100ML IV EMUL
INTRAVENOUS | Status: AC
  Filled 2023-07-29: qty 200

## 2023-07-29 MED ORDER — PROPOFOL 10 MG/ML IV BOLUS
INTRAVENOUS | Status: AC
  Filled 2023-07-29: qty 20

## 2023-07-29 MED ORDER — KETAMINE HCL 50 MG/5ML IJ SOSY
PREFILLED_SYRINGE | INTRAMUSCULAR | Status: AC
  Filled 2023-07-29: qty 5

## 2023-07-29 MED ORDER — HYDROMORPHONE HCL 1 MG/ML IJ SOLN
INTRAMUSCULAR | Status: AC
  Filled 2023-07-29: qty 1

## 2023-07-29 MED ORDER — TRAMADOL HCL 50 MG PO TABS: 50.0000 mg | ORAL_TABLET | Freq: Four times a day (QID) | ORAL | Status: DC | PRN

## 2023-07-29 MED ORDER — FENTANYL CITRATE (PF) 100 MCG/2ML IJ SOLN
INTRAMUSCULAR | Status: DC | PRN
  Administered 2023-07-29 (×2): 100 ug via INTRAVENOUS

## 2023-07-29 MED ORDER — PHENYLEPHRINE HCL (PRESSORS) 10 MG/ML IV SOLN
INTRAVENOUS | Status: DC | PRN
  Administered 2023-07-29: 120 ug via INTRAVENOUS

## 2023-07-29 MED ORDER — MAGNESIUM CITRATE PO SOLN: 1.0000 | Freq: Once | ORAL | Status: DC

## 2023-07-29 MED ORDER — DIPHENHYDRAMINE HCL 12.5 MG/5ML PO ELIX: 12.5000 mg | ORAL_SOLUTION | Freq: Four times a day (QID) | ORAL | Status: DC | PRN

## 2023-07-29 MED ORDER — HYDROMORPHONE HCL 1 MG/ML IJ SOLN
0.5000 mg | INTRAMUSCULAR | Status: DC | PRN
  Administered 2023-07-29: 1 mg via INTRAVENOUS
  Filled 2023-07-29: qty 1

## 2023-07-29 MED ORDER — DOCUSATE SODIUM 100 MG PO CAPS
100.0000 mg | ORAL_CAPSULE | Freq: Two times a day (BID) | ORAL | Status: DC
  Administered 2023-07-29: 100 mg via ORAL
  Filled 2023-07-29: qty 1

## 2023-07-29 MED ORDER — BUPIVACAINE LIPOSOME 1.3 % IJ SUSP
INTRAMUSCULAR | Status: DC | PRN
  Administered 2023-07-29: 20 mL

## 2023-07-29 MED ORDER — PROPOFOL 10 MG/ML IV BOLUS
INTRAVENOUS | Status: DC | PRN
  Administered 2023-07-29: 50 ug/kg/min via INTRAVENOUS
  Administered 2023-07-29: 150 mg via INTRAVENOUS

## 2023-07-29 MED ORDER — CLINDAMYCIN PHOSPHATE 900 MG/50ML IV SOLN
900.0000 mg | INTRAVENOUS | Status: AC
  Administered 2023-07-29: 900 mg via INTRAVENOUS
  Filled 2023-07-29: qty 50

## 2023-07-29 MED ORDER — DROPERIDOL 2.5 MG/ML IJ SOLN: 0.6250 mg | Freq: Once | INTRAMUSCULAR | Status: DC | PRN

## 2023-07-29 SURGICAL SUPPLY — 45 items
BAG COUNTER SPONGE SURGICOUNT (BAG) IMPLANT
BENZOIN TINCTURE PRP APPL 2/3 (GAUZE/BANDAGES/DRESSINGS) IMPLANT
BLADE EXTENDED COATED 6.5IN (ELECTRODE) ×1 IMPLANT
BLADE HEX COATED 2.75 (ELECTRODE) ×1 IMPLANT
CHLORAPREP W/TINT 26 (MISCELLANEOUS) ×1 IMPLANT
COVER BACK TABLE 60X90IN (DRAPES) ×1 IMPLANT
COVER SURGICAL LIGHT HANDLE (MISCELLANEOUS) ×1 IMPLANT
DISSECTOR ROUND CHERRY 3/8 STR (MISCELLANEOUS) ×1 IMPLANT
DRAIN CHANNEL 10F 3/8 F FF (DRAIN) ×1 IMPLANT
DRAPE LAPAROTOMY T 102X78X121 (DRAPES) ×1 IMPLANT
DRAPE WARM FLUID 44X44 (DRAPES) ×1 IMPLANT
ELECT REM PT RETURN 15FT ADLT (MISCELLANEOUS) ×1 IMPLANT
EVACUATOR SILICONE 100CC (DRAIN) ×1 IMPLANT
GAUZE 4X4 16PLY ~~LOC~~+RFID DBL (SPONGE) ×1 IMPLANT
GAUZE SPONGE 4X4 12PLY STRL (GAUZE/BANDAGES/DRESSINGS) ×1 IMPLANT
GLOVE SURG LX STRL 7.5 STRW (GLOVE) ×1 IMPLANT
GOWN STRL REUS W/ TWL XL LVL3 (GOWN DISPOSABLE) ×1 IMPLANT
KIT BASIN OR (CUSTOM PROCEDURE TRAY) ×1 IMPLANT
KIT TURNOVER KIT A (KITS) IMPLANT
LOOP VESSEL MAXI BLUE (MISCELLANEOUS) ×1 IMPLANT
NDL HYPO 22X1.5 SAFETY MO (MISCELLANEOUS) IMPLANT
NEEDLE HYPO 22X1.5 SAFETY MO (MISCELLANEOUS) ×1 IMPLANT
NS IRRIG 1000ML POUR BTL (IV SOLUTION) ×2 IMPLANT
PACK GENERAL/GYN (CUSTOM PROCEDURE TRAY) ×1 IMPLANT
PLUG CATH AND CAP STRL 200 (CATHETERS) ×1 IMPLANT
SPIKE FLUID TRANSFER (MISCELLANEOUS) ×1 IMPLANT
SPONGE T-LAP 4X18 ~~LOC~~+RFID (SPONGE) ×2 IMPLANT
STAPLER SKIN PROX 35W (STAPLE) ×1 IMPLANT
SURGILUBE 2OZ TUBE FLIPTOP (MISCELLANEOUS) ×1 IMPLANT
SUT CHROMIC 2 0 SH (SUTURE) ×1 IMPLANT
SUT CHROMIC 3 0 SH 27 (SUTURE) ×1 IMPLANT
SUT ETHILON 3 0 PS 1 (SUTURE) ×1 IMPLANT
SUT SILK 0 30XBRD TIE 6 (SUTURE) IMPLANT
SUT SILK 2 0 SH (SUTURE) IMPLANT
SUT SILK 2-0 30XBRD TIE 12 (SUTURE) IMPLANT
SUT VIC AB 1 CT1 27XBRD ANTBC (SUTURE) ×1 IMPLANT
SUT VIC AB 2-0 UR5 27 (SUTURE) IMPLANT
SUT VIC AB 3-0 SH 27X BRD (SUTURE) IMPLANT
SYR 10ML LL (SYRINGE) ×1 IMPLANT
SYR 20ML LL LF (SYRINGE) IMPLANT
SYSTEM UROSTOMY GENTLE TOUCH (WOUND CARE) IMPLANT
TAPE UMBILICAL 1/8 X36 TWILL (MISCELLANEOUS) ×1 IMPLANT
TRAY FOLEY MTR SLVR 16FR STAT (SET/KITS/TRAYS/PACK) ×1 IMPLANT
TUBING INSUFFLATION 10FT LAP (TUBING) IMPLANT
WATER STERILE IRR 1000ML POUR (IV SOLUTION) ×1 IMPLANT

## 2023-07-29 NOTE — Brief Op Note (Signed)
 07/29/2023  12:11 PM  PATIENT:  Susan Snyder  58 y.o. female  PRE-OPERATIVE DIAGNOSIS:  STOMA STENOSIS OF CONDUIT  POST-OPERATIVE DIAGNOSIS:  STOMA STENOSIS OF CONDUIT  PROCEDURE:  Procedure(s): REVISION, URINARY CONDUIT (N/A)  SURGEON:  Surgeons and Role:    * Manny, Harvey Linen., MD - Primary  PHYSICIAN ASSISTANT:   ASSISTANTS: Carrolyn Clan PA   ANESTHESIA:   local and general  EBL:  20 mL   BLOOD ADMINISTERED:none  DRAINS: RLQ urostomy to gravity   LOCAL MEDICATIONS USED:  MARCAINE      SPECIMEN:  Source of Specimen:  distal stenotic conduit segment  DISPOSITION OF SPECIMEN:  discard  COUNTS:  YES  TOURNIQUET:  * No tourniquets in log *  DICTATION: .Other Dictation: Dictation Number [id number]  PLAN OF CARE: Admit for overnight observation  PATIENT DISPOSITION:  PACU - hemodynamically stable.   Delay start of Pharmacological VTE agent (>24hrs) due to surgical blood loss or risk of bleeding: yes

## 2023-07-29 NOTE — Plan of Care (Signed)

## 2023-07-29 NOTE — Transfer of Care (Signed)
 Immediate Anesthesia Transfer of Care Note  Patient: Susan Snyder  Procedure(s) Performed: REVISION, URINARY CONDUIT  Patient Location: PACU  Anesthesia Type:General  Level of Consciousness: drowsy  Airway & Oxygen Therapy: Patient Spontanous Breathing  Post-op Assessment: Report given to RN  Post vital signs: Reviewed and stable  Last Vitals:  Vitals Value Taken Time  BP 100/79 07/29/23 1225  Temp    Pulse 86 07/29/23 1229  Resp 23 07/29/23 1229  SpO2 91 % 07/29/23 1229  Vitals shown include unfiled device data.  Last Pain:  Vitals:   07/29/23 0923  TempSrc: Oral         Complications: No notable events documented.

## 2023-07-29 NOTE — H&P (Signed)
 Susan Snyder is an 58 y.o. female.    Chief Complaint: Pre-OP Open Ileal Conduit Stomal Revision  HPI:   1 - Bladder Cancer - s/p robotic cystectomy / BSO / Node dissection / conduit diversion 02/2020 for pT1N0Mx urothelial cancer (giant cell differentiaion) with negative margins. No neoadjuvant chemo as stage 1.   Summarized Post-Cystectomy Course:  11/2020 - CT - no recurrence, Cr 1.1.  12/2021 - CMP, CXR, CT - no recurrence, Cr 1.2, non-obstructive ventral hernia noted.  12/2022 - CMP, CXR< CT - no recurrence, Cr 1.0, stable non-obstructive venral hernia   2 - Urinary Diversion / Stomal Ischemia / Stomal Stenosis - pt with skin-only stomal ischemia to conduit peri-op 02/2020. She has very thick abd wall form her obesity with long length conduit tunneling. Endoscopy of conduit 02/2020 with excellent viability / vascularity beginning 1cm proximal to stoma therefore managed with observation. New dilation of conduit and bilateral renal pelvis on CT early 2025 and exam with some stricturing at skin. She has fortunatly lost considerable weight sinnce her initial ostomy creation.    PMH sig for obesity, lumbago/back surgery, bilateral total hip, appy, hyst open pfannenstiel), Her PCP is Adeline Hone PA with [facility].   Today " Susan Snyder " is seen to proceed with revision of ostomy stoma. No interval fevers. Most recetn UCX MDR serratia and has been on cipro  pre-op to reduce colinazation. Hgb 13, Cr 0.87 most recently.   Past Medical History:  Diagnosis Date   Abnormal finding on MRI of brain 12/28/2014   Sept 28, 2016 [facility]   Anemia    Anxiety    Arthritis    Basal cell carcinoma 2017   R med canthus - treated with Mohs in [facility]   Basal cell carcinoma 11/26/2021   R hand dorsum - ED&C   Cancer (HCC)    bladder   CCC (chronic calculous cholecystitis) 04/03/2019   Chronic diarrhea    Chronic gastritis 11/2013   on EGD    Collagenous colitis    colonoscopy biopsies 11/01/13    Complication of anesthesia    DDD (degenerative disc disease), cervical    Frequent headaches 12/28/2014   GERD (gastroesophageal reflux disease)    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   History of kidney stones    History of recurrent UTIs 08/01/2019   Hx of iron deficiency anemia 01/14/2015   Hyperplastic polyps of stomach    Aug 2015   Hypertension    Neoplasm of uncertain behavior of skin 05/20/2016   Hx of skin cancer, BCC   Neoplasm of uncertain behavior of skin 05/20/2016   Hx of skin cancer, BCC   PONV (postoperative nausea and vomiting)    only with her hysterectomy   Presence of urostomy (HCC)    Primary localized osteoarthrosis, pelvic region and thigh 10/25/2013   Renal cyst    Tobacco abuse 12/23/2014    Past Surgical History:  Procedure Laterality Date   ABDOMINAL HYSTERECTOMY  2005   partial, still has ovaries, due to fibroids.   APPENDECTOMY     BACK SURGERY  1996, 1998, 1999   x 4   CHOLECYSTECTOMY N/A 04/11/2019   Procedure: LAPAROSCOPIC CHOLECYSTECTOMY;  Surgeon: Flynn Hylan, MD;  Location: [facility] ORS;  Service: General;  Laterality: N/A;   COLONOSCOPY W/ BIOPSIES  11/01/13   collagenous colitis, sigmoid polypectomy x 2; hyperplastic polyps   COLONOSCOPY WITH PROPOFOL  N/A 06/18/2021   Procedure: COLONOSCOPY WITH PROPOFOL ;  Surgeon: Marnee Sink, MD;  [facility];  Service: Endoscopy;  Laterality: N/A;   CYSTECTOMY N/A 02/22/2020   Procedure: ENDOSCOPY OF ILEAL CONDUIT;  Surgeon: Osborn Blaze, MD;  Location: [facility];  Service: Urology;  Laterality: N/A;   CYSTOSCOPY WITH BIOPSY N/A 11/19/2019   Procedure: CYSTOSCOPY WITH BIOPSY;  Surgeon: Dustin Gimenez, MD;  Location: [facility] OR[facility];  Service: Urology;  Laterality: N/A;   CYSTOSCOPY WITH INJECTION N/A 02/20/2020   Procedure: CYSTOSCOPY WITH INJECTION OF INDOCYANINE GREEN  DYE;  Surgeon: Osborn Blaze, MD;  Location: [facility];  Service: Urology;  Laterality: N/A;   ESOPHAGOGASTRODUODENOSCOPY  11/22/13    chronic gastritis   HIP ARTHROPLASTY Bilateral 01/15/14,03/05/14   total hip, Dr. Wally Gunnels   LAPAROSCOPIC APPENDECTOMY N/A 08/20/2018   Procedure: APPENDECTOMY LAPAROSCOPIC;  Surgeon: Mercy Stall, MD;  Location: [facility];  Service: General;  Laterality: N/A;   LYMPHADENECTOMY Bilateral 02/20/2020   Procedure: Ane Keener;  Surgeon: Osborn Blaze, MD;  Location: [facility];  Service: Urology;  Laterality: Bilateral;   NECK SURGERY  2015   POLYPECTOMY  06/18/2021   Procedure: POLYPECTOMY;  Surgeon: Marnee Sink, MD;  Location: [facility];  Service: Endoscopy;;   ROBOT ASSISTED LAPAROSCOPIC COMPLETE CYSTECT ILEAL CONDUIT N/A 02/20/2020   Procedure: XI ROBOTIC ASSISTED LAPAROSCOPIC COMPLETE CYSTECT ILEAL CONDUIT AND BILATERAL OOPHORECTOMY;  Surgeon: Osborn Blaze, MD;  Location: [facility];  Service: Urology;  Laterality: N/A;  6 HRS   TRANSURETHRAL RESECTION OF BLADDER TUMOR WITH MITOMYCIN -C N/A 10/29/2019   Procedure: TRANSURETHRAL RESECTION OF BLADDER TUMOR;  Surgeon: Dustin Gimenez, MD;  Location: [facility] OR[facility];  Service: Urology;  Laterality: N/A;   TRANSURETHRAL RESECTION OF BLADDER TUMOR WITH MITOMYCIN -C N/A 11/19/2019   Procedure: TRANSURETHRAL RESECTION OF BLADDER TUMOR WITH Gemcitabine ;  Surgeon: Dustin Gimenez, MD;  Location: [facility] OR[facility];  Service: Urology;  Laterality: N/A;    Family History  Problem Relation Age of Onset   Stroke Mother    Glaucoma Mother    Heart disease Father    Cancer Father        prostate   Heart disease Sister    Diabetes Sister    Heart disease Sister        pace maker   Diabetes Paternal Aunt    Heart disease Paternal Aunt    Diabetes Paternal Aunt    Heart disease Paternal Aunt    Bleeding Disorder Paternal Uncle    Breast cancer Neg Hx    Social History:  reports that she has been smoking cigarettes. She started smoking about 33 years ago. She has a 30 pack-year smoking history. She has never used smokeless tobacco. She reports that she  does not currently use alcohol. She reports that she does not use drugs.  Allergies:  Allergies  Allergen Reactions   Adhesive [Tape] Other (See Comments)    Blisters   Gabapentin Nausea And Vomiting   Hydrocodone -Acetaminophen  Nausea And Vomiting    hallucinations   Keflex [Cephalexin] Shortness Of Breath and Palpitations   Sulfa Antibiotics Shortness Of Breath and Palpitations   Penicillins Rash    Did it involve swelling of the face/tongue/throat, SOB, or low BP? No Did it involve sudden or severe rash/hives, skin peeling, or any reaction on the inside of your mouth or nose? No Did you need to seek medical attention at a hospital or doctor's office? No When did it last happen?      Childhood If all above answers are "NO", Liszewski proceed with cephalosporin use.     No medications prior to admission.  No results found for this or any previous visit (from the past 48 hours). No results found.  Review of Systems  Constitutional:  Negative for chills and fever.  All other systems reviewed and are negative.   There were no vitals taken for this visit. Physical Exam Vitals reviewed.  HENT:     Head: Normocephalic.  Eyes:     Pupils: Pupils are equal, round, and reactive to light.  Pulmonary:     Effort: Pulmonary effort is normal.  Abdominal:     Comments: RLQ ostomy patent of non-foul urine, ostomy stenotic, non-necrotic  Musculoskeletal:        General: Normal range of motion.     Cervical back: Normal range of motion.  Skin:    General: Skin is warm.  Neurological:     Mental Status: She is alert.  Psychiatric:        Mood and Affect: Mood normal.      Assessment/Plan  Proceed as planned with stomal revision. Risks, benefits, alternatives, expected peri-op course discussed previously and reiterated today.   Melody Spurling., MD 07/29/2023, 6:42 AM

## 2023-07-29 NOTE — Anesthesia Procedure Notes (Signed)
 Procedure Name: Intubation Date/Time: 07/29/2023 11:22 AM  Performed by: Raylene Calamity, CRNAPre-anesthesia Checklist: Patient identified, Emergency Drugs available, Suction available and Patient being monitored Patient Re-evaluated:Patient Re-evaluated prior to induction Oxygen Delivery Method: Circle System Utilized Preoxygenation: Pre-oxygenation with 100% oxygen Induction Type: IV induction Ventilation: Mask ventilation without difficulty Laryngoscope Size: Mac and 3 Grade View: Grade I Tube type: Oral Tube size: 7.0 mm Number of attempts: 1 Airway Equipment and Method: Stylet and Oral airway Placement Confirmation: ETT inserted through vocal cords under direct vision, positive ETCO2 and breath sounds checked- equal and bilateral Secured at: 22 cm Tube secured with: Tape Dental Injury: Teeth and Oropharynx as per pre-operative assessment

## 2023-07-29 NOTE — Discharge Instructions (Signed)

## 2023-07-29 NOTE — Anesthesia Preprocedure Evaluation (Addendum)
 Anesthesia Evaluation  Patient identified by MRN, date of birth, ID band Patient awake    Reviewed: Allergy & Precautions, NPO status , Patient's Chart, lab work & pertinent test results  History of Anesthesia Complications (+) PONV and history of anesthetic complications  Airway Mallampati: II  TM Distance: >3 FB Neck ROM: Full    Dental no notable dental hx. (+) Dental Advisory Given   Pulmonary Current Smoker, former smoker   Pulmonary exam normal breath sounds clear to auscultation       Cardiovascular Exercise Tolerance: Good hypertension, Pt. on home beta blockers and Pt. on medications Normal cardiovascular exam Rhythm:Regular Rate:Normal     Neuro/Psych  Headaches PSYCHIATRIC DISORDERS Anxiety Depression     Neuromuscular disease    GI/Hepatic Neg liver ROS,GERD  Medicated and Controlled,,  Endo/Other  BMI 36  Renal/GU Renal disease     Musculoskeletal  (+) Arthritis ,    Abdominal  (+) + obese  Peds  Hematology  (+) Blood dyscrasia, anemia   Anesthesia Other Findings   Reproductive/Obstetrics                             Anesthesia Physical Anesthesia Plan  ASA: 2  Anesthesia Plan: General   Post-op Pain Management: Tylenol  PO (pre-op)*   Induction: Intravenous  PONV Risk Score and Plan: 4 or greater and Treatment Rondinelli vary due to age or medical condition, Ondansetron , Propofol  infusion and Dexamethasone   Airway Management Planned: Oral ETT  Additional Equipment: None  Intra-op Plan:   Post-operative Plan: Extubation in OR  Informed Consent: I have reviewed the patients History and Physical, chart, labs and discussed the procedure including the risks, benefits and alternatives for the proposed anesthesia with the patient or authorized representative who has indicated his/her understanding and acceptance.     Dental advisory given  Plan Discussed with:  CRNA  Anesthesia Plan Comments:         Anesthesia Quick Evaluation

## 2023-07-29 NOTE — Anesthesia Postprocedure Evaluation (Signed)
 Anesthesia Post Note  Patient: Susan Snyder  Procedure(s) Performed: REVISION, URINARY CONDUIT     Anesthesia Type: General Anesthetic complications: no   No notable events documented.  Last Vitals:  Vitals:   07/29/23 1515 07/29/23 1605  BP: 98/64 110/85  Pulse: 91 85  Resp: (!) 22 20  Temp:  36.8 C  SpO2: 100% 99%    Last Pain:  Vitals:   07/29/23 1605  TempSrc: Oral  PainSc:                  Gorman Laughter

## 2023-07-30 ENCOUNTER — Encounter (HOSPITAL_COMMUNITY): Payer: Self-pay | Admitting: Urology

## 2023-07-30 DIAGNOSIS — I10 Essential (primary) hypertension: Secondary | ICD-10-CM | POA: Diagnosis not present

## 2023-07-30 DIAGNOSIS — F17210 Nicotine dependence, cigarettes, uncomplicated: Secondary | ICD-10-CM | POA: Diagnosis not present

## 2023-07-30 DIAGNOSIS — Z8551 Personal history of malignant neoplasm of bladder: Secondary | ICD-10-CM | POA: Diagnosis not present

## 2023-07-30 DIAGNOSIS — Z96643 Presence of artificial hip joint, bilateral: Secondary | ICD-10-CM | POA: Diagnosis not present

## 2023-07-30 DIAGNOSIS — Y828 Other medical devices associated with adverse incidents: Secondary | ICD-10-CM | POA: Diagnosis not present

## 2023-07-30 DIAGNOSIS — Z85828 Personal history of other malignant neoplasm of skin: Secondary | ICD-10-CM | POA: Diagnosis not present

## 2023-07-30 DIAGNOSIS — T8385XA Stenosis of genitourinary prosthetic devices, implants and grafts, initial encounter: Secondary | ICD-10-CM | POA: Diagnosis not present

## 2023-07-30 NOTE — Op Note (Unsigned)
 NAME: Susan Snyder, Susan Snyder MEDICAL RECORD NO: [id number] ACCOUNT NO: [id number] DATE OF BIRTH: 1965-10-15 FACILITY: [facility] LOCATION: [facility] PHYSICIAN: Osborn Blaze, MD  Operative Report   DATE OF PROCEDURE: 07/29/2023  PREOPERATIVE DIAGNOSIS:  Severe stenosis of ileal conduit.  PROCEDURE PERFORMED:  Open revision of ileal conduit.  ESTIMATED BLOOD LOSS:  None.  COMPLICATIONS:  None.  SPECIMEN:  Distal conduit segment, stenotic for discard.  FINDINGS: 1.  Stenotic distal 1 cm of ileal conduit. 2.  Widely patent conduit proximal to this stricture segment.  ASSISTANT:  Carrolyn Clan, PA.  INDICATIONS:  The patient is a 58 year old lady with a history of aggressive variant bladder cancer status post cystectomy in 2021.  She has done very well from an oncologic perspective following this.  Her immediate postop course back in 2021 was  complicated by some distal stomal ischemia.  It was managed conservatively and she has done well for a number of years; however, with a somewhat narrow caliber stoma to skin interface.  She developed, unfortunately anuria, acute renal failure, and  hydronephrosis earlier this year.  She was noted to have more severe stenosis of her distal conduit segment.  This was temporized with balloon dilation and catheter placement across this; however, the stenosis rapidly recurred.  Notably, her original  stoma was formed when she weighed 50 pounds more and with her weight loss and having had the conduit in place for a number of years there is now more redundancy of this on imaging.  Options were discussed for management including continued catheter and  stoma versus revision.  We both agreed on revision with goal to get her tube free.  She presents for this today.  Informed consent was obtained and placed in medical record.  DESCRIPTION OF PROCEDURE:  The patient was identified and verified and the procedure being open revision ileal conduit was confirmed.  Procedure timeout  was performed.  Intravenous antibiotics were administered.  General endotracheal anesthesia was  induced.  The patient was placed in the supine position.  A sterile field was created, prepped and draped the patient's abdomen using iodine at the level of the stoma and chlorhexidine  gluconate otherwise from her xiphoid to her pubis.  Inspection under  anesthesia revealed a quite stenotic skin to the stomal interface, but there was barely patency of urine.  An incision was made circumferentially around this approximately 2 inches in diameter.  This was estimated circumference of the true intestinal segment  diameter.  Two silk sutures were applied at the level of the skin to manipulate this and very careful dissection was performed circumferentially around the true distal segment down to the level of the fascia taking exquisite care to avoid  devascularization of its mesenteric pedicle.  As anticipated, there was significantly more mobility and redundancy of this than there was at her initial surgery and even dissection just above the fascia appeared to have sufficient length for stomal  revision, which was quite favorable and lowest risk in achieving the goals today.  The distal 2 cm most of the segment was excised using cold scissors thus excising the scarified segment.  The revised distal aspect was visibly viable, widely patent to  accommodate even the surgeon's thumb and appeared to have adequate vascularity for re-anastomosis to the level of the skin.  Four rose budding sutures were applied in a quadrant fashion using 2-0 Vicryl, which allowed an excellent opening of the orifice  further; however, there was little actual rose budding of the stoma, but  this was still felt to be a very adequate result.  It was widely patent.  Further, skin to stoma reapproximation was performed using interrupted Vicryl x approximately 4 for each  quadrant.  The stoma was widely patent, visibly viable.  A new ostomy  appliance was placed.  The procedure was terminated after infiltrating the area with Exparel .  The patient tolerated the procedure well with no immediate perioperative complications.   The patient was taken to postanesthesia care in stable position.  Plan for observation admission and likely discharge home tomorrow.  Please note, first assistant, Carrolyn Clan was crucial for all portions of the surgery today, she provided invaluable retraction, suctioning, stoma manipulation and general first assistance.   PUS D: 07/29/2023 12:19:51 pm T: 07/30/2023 5:07:00 am  JOB: [id number]/ [id number]

## 2023-07-30 NOTE — Discharge Summary (Signed)
 Physician Discharge Summary  [name] MRN: [id number] DOB/AGE: [birth year] 59 y.o.  Admit date: 07/29/2023 Discharge date: 07/30/2023  Admission Diagnoses: Stenosis of ileal conduit  Discharge Diagnoses: Same  Discharged Condition: good  Hospital Course:  On 07/29/2023 she underwent revision of her ileal conduit with stenosis.  Stoma was pink and healthy appearing on postoperative day 1 and she felt well without any abdominal complaints.  Treatments: surgery: Revision of ileal conduit stenosis  Discharge Exam: Blood pressure 127/66, pulse 65, temperature 98 F (36.7 C), resp. rate 20, height 5' 8.5" (1.74 m), weight 91.6 kg, SpO2 97%.  Well-appearing Conduit pink and healthy appearing with yellow urine  Disposition: Discharge disposition: 01-Home or Self Care       Discharge Instructions     Discharge instructions   Complete by: As directed    You underwent revision of your conduit.    No strenuous activity for at least 1 to 2 weeks  No dietary restrictions.   Discharge patient   Complete by: As directed    Discharge disposition: 01-Home or Self Care   Discharge patient date: 07/30/2023      Allergies as of 07/30/2023       Reactions   Adhesive [tape] Other (See Comments)   Blisters   Gabapentin Nausea And Vomiting   Hydrocodone -acetaminophen  Nausea And Vomiting   hallucinations   Keflex [cephalexin] Shortness Of Breath, Palpitations   Sulfa Antibiotics Shortness Of Breath, Palpitations   Penicillins Rash   Did it involve swelling of the face/tongue/throat, SOB, or low BP? No Did it involve sudden or severe rash/hives, skin peeling, or any reaction on the inside of your mouth or nose? No Did you need to seek medical attention at a hospital or doctor's office? No When did it last happen?      Childhood If all above answers are "NO", [place] proceed with cephalosporin use.        Medication List     TAKE these medications    acetaminophen  500  MG tablet Commonly known as: TYLENOL  Take 1,000 mg by mouth every 6 (six) hours as needed (pain.).   aspirin  EC 81 MG tablet Take 1 tablet (81 mg total) by mouth daily.   B-12 SL Take 5,000 mcg by mouth in the morning.   buPROPion  150 MG 24 hr tablet Commonly known as: Wellbutrin  XL Take 1 tablet (150 mg total) by mouth in the morning. What changed: when to take this   docusate sodium  100 MG capsule Commonly known as: COLACE Take 1 capsule (100 mg total) by mouth 2 (two) times daily.   pantoprazole  40 MG tablet Commonly known as: PROTONIX  TAKE 1 TABLET BY MOUTH EVERY DAY   rosuvastatin  10 MG tablet Commonly known as: Crestor  Take 1 tablet (10 mg total) by mouth in the morning.   traMADol 50 MG tablet Commonly known as: Ultram Take 1-2 tablets (50-100 mg total) by mouth every 6 (six) hours as needed for moderate pain (pain score 4-6) or severe pain (pain score 7-10).   Vitamin D3 125 MCG (5000 UT) Tabs Take 5,000 Units by mouth in the morning.        Follow-up Information     Secundino Dach Harvey Linen., MD Follow up on 08/16/2023.   Specialty: Urology Why: at 1:30 for MD visit Contact information: [address] [place] [address] [phone]                 Greater than 30 minutes  were spent on the floor with greater than 50% spent on patient counseling and care regarding hospital course, follow-up plan, and discharge instructions.  Signed: Lawerence Pressman 07/30/2023, 8:31 AM

## 2023-07-30 NOTE — TOC Initial Note (Signed)
 Transition of Care [facility]) - Initial/Assessment Note    Patient Details  Name: Susan Snyder MRN: [id number] Date of Birth: [birth year]  Transition of [facility]) CM/SW Contact:    Susan Ream, RN Phone Number: 07/30/2023, 10:02 AM  Clinical Narrative:                 Spoke w/ pt in room; pt says she lives at home; she plans to return at d/c; she identified POC dtr Susan Snyder ([phone]); her dtr will provide transportation; pt verified insurance/PCP; she denied SDOH risks; pt says she does not have DME, HH services, or home oxygen; no TOC needs.  Expected Discharge Plan: Home/Self Care Barriers to Discharge: No Barriers Identified   Patient Goals and CMS Choice Patient states their goals for this hospitalization and ongoing recovery are:: home          Expected Discharge Plan and Services   Discharge Planning Services: CM Consult Post Acute Care Choice: NA Living arrangements for the past 2 months: Single Family Home Expected Discharge Date: 07/30/23               DME Arranged: N/A DME Agency: NA       HH Arranged: NA HH Agency: NA        Prior Living Arrangements/Services Living arrangements for the past 2 months: Single Family Home Lives with:: Self Patient language and need for interpreter reviewed:: Yes Do you feel safe going back to the place where you live?: Yes      Need for Family Participation in Patient Care: Yes (Comment) Care giver support system in place?: Yes (comment) Current home services:  (n/a) Criminal Activity/Legal Involvement Pertinent to Current Situation/Hospitalization: No - Comment as needed  Activities of Daily Living   ADL Screening (condition at time of admission) Independently performs ADLs?: Yes (appropriate for developmental age) Is the patient deaf or have difficulty hearing?: No Does the patient have difficulty seeing, even when wearing glasses/contacts?: No Does the patient have difficulty concentrating,  remembering, or making decisions?: No  Permission Sought/Granted Permission sought to share information with : Case Manager Permission granted to share information with : Yes, Verbal Permission Granted  Share Information with NAME: Case Manager     Permission granted to share info w Relationship: Susan Snyder (dtr) [phone]     Emotional Assessment Appearance:: Appears stated age Attitude/Demeanor/Rapport: Gracious Affect (typically observed): Accepting Orientation: : Oriented to Self, Oriented to Place, Oriented to  Time, Oriented to Situation Alcohol / Substance Use: Not Applicable Psych Involvement: No (comment)  Admission diagnosis:  Stricture of ureteroileal conduit [facility]) [W09.811] Ileal conduit stomal stenosis [B14.782N] Patient Active Problem List   Diagnosis Date Noted   Ileal conduit stomal stenosis 07/29/2023   Surgical menopause, symptomatic 06/30/2022   Flank pain 06/30/2022   Left upper quadrant abdominal pain 06/30/2022   Prediabetes 03/26/2022   Stage 3b chronic kidney disease (CKD) (HCC) 03/26/2022   Current moderate episode of major depressive disorder, unspecified whether recurrent (HCC) 03/26/2022   Presence of urostomy (HCC) 02/05/2022   History of colonic polyps    Polyp of transverse colon    Malignant neoplasm of lateral wall of urinary bladder (HCC) 02/20/2020   Mixed hyperlipidemia 12/20/2019   Major depressive disorder with current active episode 08/01/2019   Vitamin B12 deficiency 01/14/2015   Vitamin D  deficiency 01/14/2015   Class 2 severe obesity with serious comorbidity and body mass index (BMI) of 35.0 to 35.9 in adult [facility]) 01/14/2015  Hypertension    Collagenous colitis    Malabsorption due to intolerance, not elsewhere classified 01/02/2014   Lumbar disc herniation with radiculopathy 05/22/2013   Cervical spondylosis 02/06/2013   Intervertebral cervical disc disorder with myelopathy, cervical region 02/06/2013   PCP:  Adeline Hone,  PA-C Pharmacy:   [facility] [phone] [place], [place] - [address] [place] [address] Phone: [phone] Fax: [phone]  [id number] - [place] - [address] [place] [address] Phone: [phone] Fax: [phone]  [id number] - [place], [place] - [address] AT [facility] [address] [place] [address] Phone: [phone] Fax: [phone]     Social Drivers of Health (SDOH) Social History: SDOH Screenings   Food Insecurity: No Food Insecurity (07/30/2023)  Housing: Low Risk  (07/30/2023)  Transportation Needs: No Transportation Needs (07/30/2023)  Utilities: Not At Risk (07/30/2023)  Alcohol Screen: Low Risk  (05/27/2021)  Depression (PHQ2-9): Medium Risk (04/27/2023)  Financial Resource Strain: Low Risk  (04/06/2023)  Physical Activity: Unknown (06/28/2022)  Recent Concern: Physical Activity - Inactive (06/28/2022)  Social Connections: Moderately Isolated (06/28/2022)  Stress: Stress Concern Present (06/28/2022)  Tobacco Use: High Risk (07/29/2023)   SDOH Interventions: Food Insecurity Interventions: Intervention Not Indicated, Inpatient TOC Housing Interventions: Intervention Not Indicated, Inpatient TOC Transportation Interventions: Intervention Not Indicated, Inpatient TOC Utilities Interventions: Intervention Not Indicated, Inpatient TOC   Readmission Risk Interventions     No data to display

## 2023-08-16 DIAGNOSIS — N99524 Stenosis of incontinent stoma of urinary tract: Secondary | ICD-10-CM | POA: Diagnosis not present

## 2023-08-16 DIAGNOSIS — C678 Malignant neoplasm of overlapping sites of bladder: Secondary | ICD-10-CM | POA: Diagnosis not present

## 2024-05-22 ENCOUNTER — Ambulatory Visit: Admitting: Dermatology
